# Patient Record
Sex: Male | Born: 1935 | Race: White | Hispanic: No | Marital: Married | Smoking: Never smoker
Health system: Southern US, Community
[De-identification: ages and names within clinical notes are randomized; demographics above are authoritative.]

## PROBLEM LIST (undated history)

## (undated) DIAGNOSIS — H5440 Blindness, one eye, unspecified eye: Secondary | ICD-10-CM

## (undated) DIAGNOSIS — R32 Unspecified urinary incontinence: Secondary | ICD-10-CM

## (undated) DIAGNOSIS — I10 Essential (primary) hypertension: Secondary | ICD-10-CM

## (undated) DIAGNOSIS — N189 Chronic kidney disease, unspecified: Secondary | ICD-10-CM

## (undated) DIAGNOSIS — N393 Stress incontinence (female) (male): Secondary | ICD-10-CM

## (undated) DIAGNOSIS — E785 Hyperlipidemia, unspecified: Secondary | ICD-10-CM

## (undated) DIAGNOSIS — M1990 Unspecified osteoarthritis, unspecified site: Secondary | ICD-10-CM

## (undated) DIAGNOSIS — H3320 Serous retinal detachment, unspecified eye: Secondary | ICD-10-CM

## (undated) DIAGNOSIS — C189 Malignant neoplasm of colon, unspecified: Secondary | ICD-10-CM

## (undated) DIAGNOSIS — I340 Nonrheumatic mitral (valve) insufficiency: Secondary | ICD-10-CM

## (undated) DIAGNOSIS — K5790 Diverticulosis of intestine, part unspecified, without perforation or abscess without bleeding: Secondary | ICD-10-CM

## (undated) DIAGNOSIS — C61 Malignant neoplasm of prostate: Secondary | ICD-10-CM

## (undated) DIAGNOSIS — C4359 Malignant melanoma of other part of trunk: Secondary | ICD-10-CM

## (undated) DIAGNOSIS — H409 Unspecified glaucoma: Secondary | ICD-10-CM

## (undated) DIAGNOSIS — H5316 Psychophysical visual disturbances: Secondary | ICD-10-CM

## (undated) DIAGNOSIS — D171 Benign lipomatous neoplasm of skin and subcutaneous tissue of trunk: Secondary | ICD-10-CM

## (undated) DIAGNOSIS — I2510 Atherosclerotic heart disease of native coronary artery without angina pectoris: Secondary | ICD-10-CM

## (undated) HISTORY — DX: Nonrheumatic mitral (valve) insufficiency: I34.0

## (undated) HISTORY — PX: EYE SURGERY: SHX253

## (undated) HISTORY — DX: Unspecified glaucoma: H40.9

## (undated) HISTORY — DX: Diverticulosis of intestine, part unspecified, without perforation or abscess without bleeding: K57.90

## (undated) HISTORY — DX: Hyperlipidemia, unspecified: E78.5

## (undated) HISTORY — DX: Malignant melanoma of other part of trunk: C43.59

## (undated) HISTORY — DX: Unspecified osteoarthritis, unspecified site: M19.90

## (undated) HISTORY — PX: TONSILLECTOMY: SUR1361

## (undated) HISTORY — PX: CHOLECYSTECTOMY: SHX55

## (undated) HISTORY — DX: Unspecified urinary incontinence: R32

## (undated) HISTORY — PX: APPENDECTOMY: SHX54

## (undated) HISTORY — DX: Malignant neoplasm of colon, unspecified: C18.9

## (undated) HISTORY — DX: Malignant neoplasm of prostate: C61

## (undated) HISTORY — DX: Benign lipomatous neoplasm of skin and subcutaneous tissue of trunk: D17.1

## (undated) HISTORY — PX: CARDIAC CATHETERIZATION: SHX172

## (undated) HISTORY — DX: Blindness, one eye, unspecified eye: H54.40

## (undated) HISTORY — DX: Serous retinal detachment, unspecified eye: H33.20

---

## 1980-01-19 HISTORY — PX: CORONARY ARTERY BYPASS GRAFT: SHX141

## 2000-05-27 ENCOUNTER — Ambulatory Visit (HOSPITAL_COMMUNITY): Admission: RE | Admit: 2000-05-27 | Discharge: 2000-05-27 | Payer: Self-pay | Admitting: Gastroenterology

## 2000-05-27 ENCOUNTER — Encounter (INDEPENDENT_AMBULATORY_CARE_PROVIDER_SITE_OTHER): Payer: Self-pay | Admitting: *Deleted

## 2000-06-01 ENCOUNTER — Encounter: Admission: RE | Admit: 2000-06-01 | Discharge: 2000-06-01 | Payer: Self-pay | Admitting: Gastroenterology

## 2000-06-01 ENCOUNTER — Encounter: Payer: Self-pay | Admitting: Gastroenterology

## 2000-07-04 ENCOUNTER — Encounter: Payer: Self-pay | Admitting: Surgery

## 2000-07-06 ENCOUNTER — Encounter (INDEPENDENT_AMBULATORY_CARE_PROVIDER_SITE_OTHER): Payer: Self-pay | Admitting: Specialist

## 2000-07-06 ENCOUNTER — Inpatient Hospital Stay (HOSPITAL_COMMUNITY): Admission: RE | Admit: 2000-07-06 | Discharge: 2000-07-11 | Payer: Self-pay | Admitting: Surgery

## 2002-03-06 ENCOUNTER — Ambulatory Visit (HOSPITAL_COMMUNITY): Admission: RE | Admit: 2002-03-06 | Discharge: 2002-03-06 | Payer: Self-pay | Admitting: Gastroenterology

## 2005-10-31 ENCOUNTER — Inpatient Hospital Stay (HOSPITAL_COMMUNITY): Admission: EM | Admit: 2005-10-31 | Discharge: 2005-11-02 | Payer: Self-pay | Admitting: Emergency Medicine

## 2007-01-06 ENCOUNTER — Encounter: Admission: RE | Admit: 2007-01-06 | Discharge: 2007-01-06 | Payer: Self-pay | Admitting: Internal Medicine

## 2010-06-05 NOTE — Cardiovascular Report (Signed)
NAME:  Luis Pierce, NEEDS NO.:  [id number]   MEDICAL RECORD NO.:  [id number]          PATIENT TYPE:  INP   LOCATION:  6522                         FACILITY:  [facility]   PHYSICIAN:  Lyn Records, M.D.   DATE OF BIRTH:  11-04-1922   DATE OF PROCEDURE:  11/01/2005  DATE OF DISCHARGE:                              CARDIAC CATHETERIZATION   REASON FOR PROCEDURE:  Unstable angina/new-onset, history of coronary artery  disease with prior saphenous vein graft to the LAD in 1982, and RCA stent  with bare metal device in 1999.   PROCEDURES PERFORMED:  1. Left heart catheterization.  2. Selective coronary angiography.  3. Left ventriculography.  4. Saphenous vein graft angiography.  5. Stent right coronary artery, bare metal.  6. Angio-Seal.   DESCRIPTION OF PROCEDURE:  After informed consent, a 6-French sheath was  placed in the right femoral artery using modified Seldinger technique.  A 6-  [place], A2 multipurpose catheter was used for hemodynamic recordings, left  ventriculography by hand injection, selective saphenous vein graft  angiography.  We then used a #4 left and right, 6-French, preformed FL and  FR catheters for left and right coronary angiography respectively.  The  patient tolerated the procedure without complications.   The right coronary had a high-grade lesion just distal to the margin of the  stent in the proximal right coronary that appeared to represent a ruptured  plaque.  We proceeded with PCI using Angiomax and 600 mg of oral Plavix.  The single bolus led to an ECT of 300.  We predilated using an Publishing rights manager guidewire, a 6-French Shepherd's crook right guide catheter and a  2.5 x 12-mm long Maverick.  We then stented using a 15 mm long x 3.5 mm  diameter vision Multilink stent.  Two balloon inflations were performed up  to 10 atmospheres.  We then postdilated to 15 atmospheres within the stent  using a 3.5 x 12 mm Quantum.  Post dilatation of the  high-grade stenosis was  reduced to 0%, but there was a very small, non-flow limiting and edge  dissection noted.  We then deployed a 12 x 3.5-mm Vision Multilink to peak  pressure of 9 atmospheres and then postdilated with the Quantum 3512 balloon  to 12 atmospheres with a nice angiographic result.  TIMI-3 flow resulted.  Angiomax was discontinued.   We demonstrated adequate anatomy for Angio-Seal closure.  We had  complication with the device in that the handle came off of the plug device  and we were just barely able to get the delivery sheath out of the patient's  groin.  We were then able to cut the device and cut the suture that holds  the plug and were then able to work the plug through the subcutaneous tissue  to the arterial site.  Good hemostasis was achieved.   CONCLUSIONS:  1. New-onset/unstable angina due to plaque rupture in the mid RCA just      beyond the distal margin of previously placed bare metal stent in 1999.  2. Patent circumflex native vessel with luminal  irregularities up to 50%.  3. Patent saphenous vein graft to the LAD with degeneration and multiple      areas of up to 50% narrowing.  4. Normal LV function.  5. Successful bare metal stent implantation x2 overlapping in the mid RCA      with reduction of 99% stenosis to 0%      with TIMI grade 3 flow.  6. Successful Angio-Seal.   PLAN:  Aspirin and Plavix.  Plavix should continue x3-4 weeks.  Clinical  followup thereafter.      Lyn Records, M.D.  Electronically Signed     HWS/MEDQ  D:  11/01/2005  T:  11/02/2005  Job:  [id number]   cc:   Hal T. Stoneking, M.D.

## 2010-06-05 NOTE — Discharge Summary (Signed)
[facility]  Patient:    Luis Pierce, Luis Pierce                       MRN: [id number] Adm. Date:  [id number] Disc. Date: [id number] Attending:  Charlton Haws CC:         Genene Churn. Sherin Quarry, M.D.   Discharge Summary  Office MR #[id number]  DISCHARGE DIAGNOSES: 1. Carcinoma of the ascending colon (T1, N0, M0). 2. Chronic calculous cholecystitis.  HISTORY OF PRESENT ILLNESS:  Luis Pierce is a 77 year old male recently found to have multiple polyps in the ascending colon and gallstones.  He was admitted for right colectomy and cholecystectomy.  HOSPITAL COURSE:  The patient was admitted and taken to the operating room. He tolerated a right colectomy and cholecystectomy nicely.  He had a very benign postoperative course.  He was up and about on the first day after surgery and started p.o. liquids on the postop day #3.  He was also started back on his antihypertensives.  Pathology report did show a stage I carcinoma in one of the polyps.  By June 24, he was tolerating solids, wanted to go home, had a bowel movement and was up and around having minimal pain and passing gas.  His vital signs were stable.  He was alert and his abdomen was soft and and benign.  CONDITION ON DISCHARGE:  Satisfactory condition.  FOLLOWUP:  Follow up with Dr. Jamey Ripa within two weeks.  DISCHARGE MEDICATIONS:  Usual home medications.  DIET:  Regular diet. DD:  08/15/00 TD:  08/15/00 Job: [id number]

## 2010-06-05 NOTE — Discharge Summary (Signed)
NAMEMarland Kitchen  Luis Pierce, Luis Pierce NO.:  [id number]   MEDICAL RECORD NO.:  [id number]          PATIENT TYPE:  INP   LOCATION:  6522                         FACILITY:  [facility]   PHYSICIAN:  Lyn Records, M.D.   DATE OF BIRTH:  05/21/1922   DATE OF ADMISSION:  10/31/2005  DATE OF DISCHARGE:  11/02/2005                               DISCHARGE SUMMARY   DISCHARGE DIAGNOSES:  1. Chest pain, resolved.  2. Coronary artery disease, non-ST-segment-elevated myocardial      infarction with a maximum troponin of 0.59, status post bare-metal      stent to the right coronary artery on November 01, 2005, under the      care of Dr. Verdis Prime.  3. Hypertension.  4. Hypercholesterolemia.  5. Glaucoma, left eye blindness  6. Long-term medication use.   HISTORY:  Luis Pierce is a 77 year old male patient who has a history of  coronary artery bypass grafting in 1982, with the following graft:  Saphenous vein graft to the LAD.  He had new onset of angina on the day  of admission, relieved with sublingual nitroglycerin.  His troponin  maximized at 0.59.  EKG showed normal sinus rhythm with no acute ST-T  wave changes, early R-wave progression in the anterior leads.   The patient then underwent cardiac catheterization on November 01, 2005,  and was found to have the following:  Less than normal LAD, 100% at the  ostium, circumflex 30% to 50% proximal and mid vessel, 70% distal RCA  was subtitled just beyond previous stent margin.  At this area, 2 bare-  metal stents were placed, reducing the subtotal lesion to 0%.  The rest  of the procedure was TIMI-3 flow.  Saphenous vein graft to the LAD was  degenerated with a less than 50% stenosis.  The patient remained in the  hospital overnight.  The following morning, his right groin was stable,  and he was thought to be ready for discharge to home.   DISCHARGE MEDICATIONS:  Include Plavix 75 mg a day, aspirin 325 mg a  day, Lipitor 80 mg a day,  Singulair 10 mg a day, Allegra 180 mg a day,  Advair daily, Toprol XL 50 mg a day, Zetia 10 mg a day, dilantin,  timolol eye drops as before.   FOLLOWUP:  Follow up with Dr. Katrinka Blazing on November 16, 2005, at 1:30 p.m.      Guy Franco, P.A.      Lyn Records, M.D.  Electronically Signed    LB/MEDQ  D:  11/22/2005  T:  11/23/2005  Job:  [id number]   cc:   Lyn Records, M.D.

## 2010-06-05 NOTE — Op Note (Signed)
[facility]  Patient:    Luis Pierce, Luis Pierce                       MRN: [id number] Proc. Date: 07/06/00 Adm. Date:  [id number] Attending:  Charlton Haws CC:         Modesta Messing, M.D.  Genene Churn. Sherin Quarry, M.D.   Operative Report  CCS#:  [id number]  PREOPERATIVE DIAGNOSES: 1. Multiple polyps of ascending colon. 2. Gallstones.  POSTOPERATIVE DIAGNOSES: 1. Multiple polyps of ascending colon. 2. Gallstones.  OPERATION PERFORMED:  Right colectomy with primary anastomosis and cholecystectomy.  SURGEON:  Dr. Jamey Ripa.  ASSISTANT:  Dr. Ezzard Standing.  ANESTHESIA:  General endotracheal.  CLINICAL HISTORY:  This patient is a 77 year old gentleman who had quite positive stools and on colonoscopy was found to have multiple polyps of his cecum and ascending colon with what appeared to be a fairly large one at the hepatic flexure and at the cecum both of which could not be removed colonoscopically. A couple of in between ones were left behind and the remaining colon, however, had been cleared. CT scan showed several small stones at the gallbladder neck. After discussion with the patient, he was admitted for right colectomy with primary anastomosis and cholecystectomy.  DESCRIPTION OF PROCEDURE:  The patient was brought to the operating room having had a Foley catheter placed in the holding area by Dr. Aldean Ast and a cuff around the ureter that had been placed for ______ deflated. After satisfactory general endotracheal anesthesia had been obtained, the abdomen was prepped and draped. I made a transverse right upper abdominal incision and divided the subcutaneous tissues anterior sheath muscle and exposed the posterior sheath. This was all done with cautery. The posterior sheath was picked up with a knife and opened with the cautery. Bimanual exam showed no gross abnormalities. There were adhesions of the small bowel and the pelvis to the old appendectomy  scar which were taken down sharply. The wound retractor was placed. The colon was mobilized and I could feel what I thought was one of the polyps in the cecum but couldnt feel the one at the hepatic flexure.  The patient was placed in the low reverse Trendelenburg and the tonsil retractor used to expose the liver and gallbladder. The gallbladder was grasped with some clamps and the peritoneum over the cystic duct opened. I found the cystic artery, triple clipped it, and divided it leaving two clips behind. The cystic duct was tiny but was able to be resected out and clipped similar to the artery and divided. The gallbladder was a little intrahepatic and I couldnt get good exposure to start from below so I took the gallbladder down from above to below using cautery. Just prior to disconnecting, I made sure everything was dry and I put a pack in the gallbladder fossa for a while and at the end of the case removed it and put some Surgicel behind but this looked completely dry.  Attention was returned to the colon. I had the entire colon mobilized up and divided the omentum so that I could separate it out and this was done in the right mid transverse colon. I divided the colon here with the GIA. I divided the small bowel wide at the junction near the ileocecal valve again with the GIA. The intervening mesentery was then divided either with the ligasure of for the large vessels clamped and double tied. Six ligasures were also used as needed.  Once the specimen was removed, I opened it on the back table. I saw a fairly large 2 cm polyp at the cecum and a second large polyp near the proximal division consistent with the two noted by Dr. Sherin Quarry on endoscopy.  The small bowel was then tacked to the colon a few inches from the staple lines and the antimesenteric border of the staple lines were cut off. The GIA was inserted and fired producing a functional end to end anastomosis. The staple  line was checked for hemostasis and appeared to be dry. At the time the defect was closed with a TA-60 stapler producing a nice closure leaving the lumen quite patent and there was no tension. Everything appeared to be healthy with good bleeding at the edges. Once everything was dry, I closed the mesenteric defect with some 2-0 silks.  Gloves and instruments were changed. The abdomen was irrigated. A final check was made for hemostasis both around the gallbladder and around the colon and everything appeared to be dry.  The abdomen was closed with a running PDS on the posterior sheath, running #1 PDS on the midline and anterior sheath. The wound subcu was irrigated and the skin closed with staples.  The patient tolerated the procedure well. Estimated blood loss was 100-200 cc. There were no operative complications. All counts were correct. DD:  07/06/00 TD:  07/06/00 Job: [id number]

## 2010-06-05 NOTE — H&P (Signed)
NAME:  Luis Pierce, Luis Pierce NO.:  [id number]   MEDICAL RECORD NO.:  [id number]          PATIENT TYPE:  EMS   LOCATION:  MAJO                         FACILITY:  [facility]   PHYSICIAN:  Lyn Records, M.D.   DATE OF BIRTH:  09-19-1922   DATE OF ADMISSION:  10/31/2005  DATE OF DISCHARGE:                                HISTORY & PHYSICAL   REASON FOR ADMISSION:  Probable unstable angina.   SUBJECTIVE:  Luis Pierce is an 77 year old gentleman with a history of  coronary artery disease.  He had saphenous vein graft to the LAD in 1982.  No prior history of myocardial infarction.  Had PCI to the right coronary  with stenting in 1999.  He has been asymptomatic since that time until this  afternoon shortly after brunch he began having his chest pressure.  Episodes  would last 15 to 30 minutes and resolved. Nitroglycerin would relieve the  discomfort.  He is currently pain free.  He came to the ER by EMS.  His  daughter, Dr. Severiano Gilbert, witnessed 1 of the episodes that was associated  with an ashen color.  There was no wheezing or other complaint.   MEDICATIONS:  Singulair 10 mg per day, Zetia 10 mg per day, aspirin 81 mg  per day, Lipitor 80 mg per day.  Toprol XL 50 mg per day, Ventolin inhaler  p.r.n., fexofenadine 180 mg per day.  Advair 250/50 once a day.  Eye drops  including timolol and Xalatan.   ALLERGIES:  NONE.   PAST MEDICAL HISTORY:  1. Coronary atherosclerosis, prior CABG and PCI as noted above.  2. Hypertension.  3. Hyperlipidemia.  4. Prostate cancer 1994.  5. Colon cancer with partial colectomy 2002, gallbladder was taken out at      that time.  6. Glaucoma.  7. Melanoma in 1983.  8. Appendectomy.  9. Urethral valve.   ALLERGIES:  NONE.   HABITS:  Does not smoke or drink.   FAMILY HISTORY:  Noncontributory.   REVIEW OF SYSTEMS:  No blood in urine or stool.  Appetite has been good.  Physical activity has been vigorous.   OBJECTIVE:  VITAL SIGNS:   On exam blood pressure 140/70, heart rate 80.  Respirations 16 and nonlabored.  SKIN:  Negative for pallor.  HEENT:  Unremarkable.  Extraocular movements are full. There is decreased  pupillary reflex on the left.  NECK:  No JVD or carotid bruits.  CHEST: Clear.  CARDIAC:  S4 gallop.  No murmur, no rub.  ABDOMEN:  Soft.  No masses.  Bowel sounds normal.  Extremities:  No edema.  Femoral pulses 2+.  Posterior tibial pulses 2+.  NEUROLOGIC:  No motor or sensory deficits.   Chest x-ray pending.  Laboratory data pending.   ASSESSMENT:  1. Probable unstable angina.  2. History of coronary atherosclerosis with saphenous vein graft to the      left anterior descending in 1982 and stent to the native right coronary      artery in 1999.  3. Hypertension under control.  4. Glaucoma on multiple  eye drops.   PLAN:  1. Lovenox.  2. IV nitro.  3. Aspirin.  4. Serial enzymes.  5. Will likely need coronary angiography.      Lyn Records, M.D.  Electronically Signed     HWS/MEDQ  D:  10/31/2005  T:  11/01/2005  Job:  [id number]   cc:   Tasia Catchings, M.D.

## 2011-08-19 ENCOUNTER — Other Ambulatory Visit: Payer: Self-pay | Admitting: Urology

## 2011-09-07 ENCOUNTER — Encounter (HOSPITAL_COMMUNITY): Payer: Self-pay | Admitting: Pharmacy Technician

## 2011-09-13 ENCOUNTER — Ambulatory Visit (HOSPITAL_COMMUNITY)
Admission: RE | Admit: 2011-09-13 | Discharge: 2011-09-13 | Disposition: A | Discharge status: Home / Self Care | Payer: Medicare Other | Source: Ambulatory Visit | Attending: Urology | Admitting: Urology

## 2011-09-13 ENCOUNTER — Encounter (HOSPITAL_COMMUNITY)
Admission: RE | Admit: 2011-09-13 | Discharge: 2011-09-13 | Disposition: A | Discharge status: Home / Self Care | Payer: Medicare Other | Source: Ambulatory Visit | Attending: Urology | Admitting: Urology

## 2011-09-13 ENCOUNTER — Encounter (HOSPITAL_COMMUNITY): Payer: Self-pay

## 2011-09-13 DIAGNOSIS — Z01812 Encounter for preprocedural laboratory examination: Secondary | ICD-10-CM | POA: Insufficient documentation

## 2011-09-13 DIAGNOSIS — N393 Stress incontinence (female) (male): Secondary | ICD-10-CM | POA: Insufficient documentation

## 2011-09-13 DIAGNOSIS — J984 Other disorders of lung: Secondary | ICD-10-CM | POA: Insufficient documentation

## 2011-09-13 HISTORY — DX: Essential (primary) hypertension: I10

## 2011-09-13 HISTORY — DX: Atherosclerotic heart disease of native coronary artery without angina pectoris: I25.10

## 2011-09-13 HISTORY — DX: Chronic kidney disease, unspecified: N18.9

## 2011-09-13 LAB — BASIC METABOLIC PANEL
BUN: 22 mg/dL (ref 6–23)
Calcium: 9.5 mg/dL (ref 8.4–10.5)
Creatinine, Ser: 0.88 mg/dL (ref 0.50–1.35)
GFR calc non Af Amer: 76 mL/min — ABNORMAL LOW (ref >90)
Glucose, Bld: 94 mg/dL (ref 70–99)
Potassium: 3.5 mEq/L (ref 3.5–5.1)

## 2011-09-13 LAB — SURGICAL PCR SCREEN: MRSA, PCR: NEGATIVE

## 2011-09-13 LAB — CBC
MCH: 31.8 pg (ref 26.0–34.0)
MCHC: 35.4 g/dL (ref 30.0–36.0)
MCV: 89.8 fL (ref 78.0–100.0)
Platelets: 159 10*3/uL (ref 150–400)
RBC: 4.59 MIL/uL (ref 4.22–5.81)

## 2011-09-13 NOTE — Patient Instructions (Signed)
[address]  09/13/2011   Your procedure is scheduled on:  Tuesday 09/21/2011 at 1030am  Report to [facility] at 0800 AM.  Call this number if you have problems the morning of surgery: [phone]   Remember:   Do not eat food:After Midnight.  May have clear liquids:until Midnight .  Marland Kitchen  Take these medicines the morning of surgery with A SIP OF WATER: Metoprolol, use eye drops -Xalatan,Trusopt, Alphagan   Do not wear jewelry  Do not wear lotions, powders, or perfumes.   Do not shave 48 hours prior to surgery. Men may shave face and neck.  Do not bring valuables to the hospital.  Contacts, dentures or bridgework may not be worn into surgery.  Leave suitcase in the car. After surgery it may be brought to your room.  For patients admitted to the hospital, checkout time is 11:00 AM the day of discharge.      Special Instructions: CHG Shower Use Special Wash: 1/2 bottle night before surgery and 1/2 bottle morning of surgery.   Please read over the following fact sheets that you were given: MRSA Information, Sleeep apnea sheet, Incentive spirometry sheet, Blood transfusion sheet                Any questions, please call me at [phone] [facility].Georgeanna Lea, RN,BSN

## 2011-09-17 NOTE — H&P (Signed)
History of Present Illness   Luis Pierce has an artificial sphincter. He has leakage without awareness. The symptoms are persisting. He has frequency and some urgency.  He has a left-sided hump and reservoir and this has been described in the past. His cuff appears to be quite proximal. Clinically the sphincter has lost volume. He has a long Pfannenstiel incision.  He was here to discuss urodynamics.  Review of systems: No change in bowel or neurologic status.   He did not void and was catheterized for 20 mL. His maximum capacity is 330 mL. He did have some instability that was low pressure. He did have a mild loss of bladder compliance reaching an end filling pressure of 11 cm of water. One could argue it was a little bit higher than this. He had sensory urgency. Penile clamp was utilized during this stage. At 200 mL his Valsalva leak-point pressure was 20 cm of water with moderate to severe leakage. At one point in time he was leaking just with bladder filling with no increased abdominal pressure being generated. During voluntary voiding he voided 277 mL with a maximum flow of 20 mL per second. His maximum voiding pressure is 35 cm of water but this was on top of a mild loss of compliance. Valsalva technique with the penile clamp was utilized and measured bladder contractility. EMG activity was within normal limits. He emptied efficiently once the clamp was removed. The details of the urodynamics are signed and dictated on the urodynamic sheet.    Past Medical History Problems  1. History of  Colon Cancer V10.05 2. History of  Detachment Left Retina 3. History of  Glaucoma 365.9 4. History of  Heart Disease 429.9 5. History of  Hypercholesterolemia 272.0 6. History of  Hypertension 401.9 7. History of  Legally Blind ([place] Definition) 369.4 8. History of  Melanoma In Situ Of The Skin 172.9 9. History of  Prostate Cancer V10.46  Surgical History Problems  1. History of  Appendectomy 2.  History of  CABG (CABG) 3. History of  Cath Stent Placement 4. History of  Cath Stent Placement 5. History of  Eye Surgery Left 6. History of  Implantation Of Artificial Urinary Sphincter (AUS) 7. History of  Prostatectomy, Perineal Radical  Current Meds 1. 35M No Sting Skin Protectant MISC; Therapy: (Recorded:28Dec2007) to 2. Allegra TABS; Therapy: (Recorded:28Dec2007) to 3. Alphagan P 0.1 % Ophthalmic Solution; Therapy: 03Apr2013 to 4. Aspirin 81 MG Oral Tablet; Therapy: (Recorded:28Dec2007) to 5. Dorzolamide HCl-Timolol Mal 22.3-6.8 MG/ML Ophthalmic Solution; Therapy: 035May2013 to 6. Fish Oil CAPS; Therapy: (Recorded:28Dec2007) to 7. Latanoprost 0.005 % Ophthalmic Solution; Therapy: (Recorded:26Jun2013) to 8. Lovaza CAPS; Therapy: (Recorded:29Sep2009) to 9. Metoprolol Succinate ER 50 MG Oral Tablet Extended Release 24 Hour; Therapy: 03Dec2012 to 10. Multiple Vitamin TABS; Therapy: (Recorded:26Jun2013) to 11. Zolpidem Tartrate 5 MG Oral Tablet; Therapy: 07Jun2013 to  Allergies Medication  1. No Known Drug Allergies  Family History Problems  1. Family history of  Death In The Family Father passed at age 18 from MI 2. Family history of  Death In The Family Mother passed at age 19 from melanoma of liver 3. Family history of  Family Health Status Number Of Children 3 daughters 4. Sororal history of  Glaucoma V19.11 5. Maternal history of  Glaucoma V19.11 6. Paternal history of  Heart Disease V17.49 7. Daughter's history of  Nephrolithiasis 8. Daughter's history of  Urologic Disorder V18.7 kidney stones  Social History Problems  1. Alcohol Use 1 glass  per week 2. Caffeine Use 2 per day 3. Marital History - Currently Married 4. Never A Smoker 5. 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 Tobacco Use V15.82  Assessment Assessed  1. Urinary Incontinence Without Sensory Awareness 788.34 2. Male Stress Incontinence 788.32  Plan Male Stress Incontinence (788.32)    1. Follow-up Office  Follow-up  Requested for: 31Jul2013  Discussion/Summary   I drew Dr. Katrinka Blazing and her father, Mr. Hanken, a picture today. We talked about watchful waiting. We talked about replacement of the artificial sphincter. I will try to get his medical records from the company. I am likely going to place the cuff in the same position of equal or possibly a little bit smaller. Proximal placement was placed which would require deactivation for 6 weeks.   I drew him a picture and we talked about an artificial sphincter in detail. Pros, cons, general surgical and anesthetic risks, and other options including behavioral therapy, the male sling, and watchful waiting were discussed. He understands that sphincters are successful in 80-90% of cases for stress incontinence (dry in approximately half), 50% for urge incontinence, and that in a small percentage of cases the incontinence can worsen. Surgical risks were described but not limited to the discussion of injury to neighboring structures including the bowel (with possible life-threatening sepsis and colostomy), and bladder, urethra (all resulting in further surgery). The risk of urethral erosion and infection were discussed with sequelae. The risks of malfunction, atrophy, and hernia/skin erosion and management were discussed. Bleeding risks with transfusion rates and risks of perineal numbness and erectile dysfunction were discussed. The risks of urinary retention requiring catheterization and slowing of urinary stream were discussed. We talked about injury to nerves/soft tissue leading to debilitating and intractable pelvic, abdominal, and lower extremity pain syndromes and neuropathies. The usual post-operative course and time of activation was described. The patient understands that he might not reach his treatment goal and that he might be worse following surgery.  I also stressed the risk of injury in the urethra at the time of implantation  that would require a Foley and stoppage of the procedure.   Unless anesthesia is asked for, we are not going to cardiac clearance. Ciprofloxacin for 10 days and the usual protocol with urine culture preop was given. Sonny Masters is hoping to do this on a Thursday.  After a thorough review of the management options for the patient's condition the patient  elected to proceed with surgical therapy as noted above. We have discussed the potential benefits and risks of the procedure, side effects of the proposed treatment, the likelihood of the patient achieving the goals of the procedure, and any potential problems that might occur during the procedure or recuperation. Informed consent has been obtained.

## 2011-09-21 ENCOUNTER — Encounter (HOSPITAL_COMMUNITY)
Admission: RE | Disposition: A | Discharge status: Home / Self Care | Payer: Self-pay | Source: Ambulatory Visit | Attending: Urology

## 2011-09-21 ENCOUNTER — Ambulatory Visit (HOSPITAL_COMMUNITY): Payer: Medicare Other | Admitting: Anesthesiology

## 2011-09-21 ENCOUNTER — Encounter (HOSPITAL_COMMUNITY): Payer: Self-pay | Admitting: Anesthesiology

## 2011-09-21 ENCOUNTER — Ambulatory Visit (HOSPITAL_COMMUNITY)
Admission: RE | Admit: 2011-09-21 | Discharge: 2011-09-22 | Disposition: A | Discharge status: Home / Self Care | Payer: Medicare Other | Source: Ambulatory Visit | Attending: Urology | Admitting: Urology

## 2011-09-21 ENCOUNTER — Encounter (HOSPITAL_COMMUNITY): Payer: Self-pay | Admitting: *Deleted

## 2011-09-21 DIAGNOSIS — E7800 Pure hypercholesterolemia, unspecified: Secondary | ICD-10-CM | POA: Insufficient documentation

## 2011-09-21 DIAGNOSIS — Z85820 Personal history of malignant melanoma of skin: Secondary | ICD-10-CM | POA: Insufficient documentation

## 2011-09-21 DIAGNOSIS — H409 Unspecified glaucoma: Secondary | ICD-10-CM | POA: Insufficient documentation

## 2011-09-21 DIAGNOSIS — N393 Stress incontinence (female) (male): Secondary | ICD-10-CM | POA: Insufficient documentation

## 2011-09-21 DIAGNOSIS — Z8546 Personal history of malignant neoplasm of prostate: Secondary | ICD-10-CM | POA: Insufficient documentation

## 2011-09-21 DIAGNOSIS — T8389XA Other specified complication of genitourinary prosthetic devices, implants and grafts, initial encounter: Secondary | ICD-10-CM | POA: Insufficient documentation

## 2011-09-21 DIAGNOSIS — H548 Legal blindness, as defined in USA: Secondary | ICD-10-CM | POA: Insufficient documentation

## 2011-09-21 DIAGNOSIS — Z85038 Personal history of other malignant neoplasm of large intestine: Secondary | ICD-10-CM | POA: Insufficient documentation

## 2011-09-21 DIAGNOSIS — Z79899 Other long term (current) drug therapy: Secondary | ICD-10-CM | POA: Insufficient documentation

## 2011-09-21 DIAGNOSIS — Z951 Presence of aortocoronary bypass graft: Secondary | ICD-10-CM | POA: Insufficient documentation

## 2011-09-21 DIAGNOSIS — I10 Essential (primary) hypertension: Secondary | ICD-10-CM | POA: Insufficient documentation

## 2011-09-21 DIAGNOSIS — Y831 Surgical operation with implant of artificial internal device as the cause of abnormal reaction of the patient, or of later complication, without mention of misadventure at the time of the procedure: Secondary | ICD-10-CM | POA: Insufficient documentation

## 2011-09-21 HISTORY — PX: CYSTOSCOPY: SHX5120

## 2011-09-21 HISTORY — PX: URINARY SPHINCTER IMPLANT: SHX2624

## 2011-09-21 LAB — BASIC METABOLIC PANEL
BUN: 23 mg/dL (ref 6–23)
Calcium: 8.6 mg/dL (ref 8.4–10.5)
GFR calc Af Amer: 89 mL/min — ABNORMAL LOW (ref >90)
GFR calc non Af Amer: 76 mL/min — ABNORMAL LOW (ref >90)
Potassium: 3.7 mEq/L (ref 3.5–5.1)

## 2011-09-21 LAB — TYPE AND SCREEN: Antibody Screen: NEGATIVE

## 2011-09-21 SURGERY — INSERTION, ARTIFICIAL URINARY SPHINCTER
Anesthesia: General | Wound class: Clean

## 2011-09-21 MED ORDER — PHENYLEPHRINE HCL 10 MG/ML IJ SOLN
INTRAMUSCULAR | Status: DC | PRN
  Administered 2011-09-21: 20 ug via INTRAVENOUS
  Administered 2011-09-21: 40 ug via INTRAVENOUS
  Administered 2011-09-21: 20 ug via INTRAVENOUS

## 2011-09-21 MED ORDER — BRIMONIDINE TARTRATE 0.15 % OP SOLN
1.0000 [drp] | Freq: Two times a day (BID) | OPHTHALMIC | Status: DC
  Administered 2011-09-21 – 2011-09-22 (×2): 1 [drp] via OPHTHALMIC
  Filled 2011-09-21: qty 5

## 2011-09-21 MED ORDER — ACETAMINOPHEN 10 MG/ML IV SOLN
INTRAVENOUS | Status: AC
  Filled 2011-09-21: qty 100

## 2011-09-21 MED ORDER — ATORVASTATIN CALCIUM 80 MG PO TABS
80.0000 mg | ORAL_TABLET | Freq: Every evening | ORAL | Status: DC
  Administered 2011-09-21: 80 mg via ORAL
  Filled 2011-09-21 (×2): qty 1

## 2011-09-21 MED ORDER — LATANOPROST 0.005 % OP SOLN
1.0000 [drp] | Freq: Every day | OPHTHALMIC | Status: DC
  Administered 2011-09-21: 1 [drp] via OPHTHALMIC
  Filled 2011-09-21: qty 2.5

## 2011-09-21 MED ORDER — ACETAMINOPHEN 10 MG/ML IV SOLN
INTRAVENOUS | Status: DC | PRN
  Administered 2011-09-21: 1000 mg via INTRAVENOUS

## 2011-09-21 MED ORDER — METOPROLOL SUCCINATE ER 50 MG PO TB24
50.0000 mg | ORAL_TABLET | Freq: Every day | ORAL | Status: DC
  Administered 2011-09-22: 50 mg via ORAL
  Filled 2011-09-21: qty 1

## 2011-09-21 MED ORDER — CEFAZOLIN SODIUM-DEXTROSE 2-3 GM-% IV SOLR
2.0000 g | INTRAVENOUS | Status: AC
  Administered 2011-09-21: 2 g via INTRAVENOUS

## 2011-09-21 MED ORDER — OXYCODONE HCL 5 MG PO TABS: 5.0000 mg | ORAL_TABLET | Freq: Once | ORAL | Status: DC | PRN

## 2011-09-21 MED ORDER — OXYCODONE HCL 5 MG/5ML PO SOLN: 5.0000 mg | Freq: Once | ORAL | Status: DC | PRN

## 2011-09-21 MED ORDER — BRIMONIDINE TARTRATE 0.1 % OP SOLN: 1.0000 [drp] | Freq: Two times a day (BID) | OPHTHALMIC | Status: DC

## 2011-09-21 MED ORDER — PROMETHAZINE HCL 25 MG/ML IJ SOLN: 6.2500 mg | INTRAMUSCULAR | Status: DC | PRN

## 2011-09-21 MED ORDER — OMEGA-3-ACID ETHYL ESTERS 1 G PO CAPS
2.0000 g | ORAL_CAPSULE | Freq: Two times a day (BID) | ORAL | Status: DC
  Administered 2011-09-21 – 2011-09-22 (×2): 2 g via ORAL
  Filled 2011-09-21 (×3): qty 2

## 2011-09-21 MED ORDER — PROPOFOL 10 MG/ML IV EMUL
INTRAVENOUS | Status: DC | PRN
  Administered 2011-09-21: 120 ug/kg/min via INTRAVENOUS

## 2011-09-21 MED ORDER — ACETAMINOPHEN 10 MG/ML IV SOLN: 1000.0000 mg | Freq: Once | INTRAVENOUS | Status: DC | PRN

## 2011-09-21 MED ORDER — BUPIVACAINE HCL 0.75 % IJ SOLN
INTRAMUSCULAR | Status: DC | PRN
  Administered 2011-09-21: 15 mg via INTRATHECAL

## 2011-09-21 MED ORDER — SODIUM CHLORIDE 0.9 % IR SOLN
Status: DC | PRN
  Administered 2011-09-21: 12:00:00

## 2011-09-21 MED ORDER — STERILE WATER FOR IRRIGATION IR SOLN
Status: DC | PRN
  Administered 2011-09-21: 30 mL

## 2011-09-21 MED ORDER — ACETAMINOPHEN 10 MG/ML IV SOLN
1000.0000 mg | Freq: Four times a day (QID) | INTRAVENOUS | Status: DC
  Administered 2011-09-21 – 2011-09-22 (×2): 1000 mg via INTRAVENOUS
  Filled 2011-09-21 (×4): qty 100

## 2011-09-21 MED ORDER — FENTANYL CITRATE 0.05 MG/ML IJ SOLN
INTRAMUSCULAR | Status: DC | PRN
  Administered 2011-09-21: 50 ug via INTRAVENOUS
  Administered 2011-09-21: 25 ug via INTRAVENOUS

## 2011-09-21 MED ORDER — METOPROLOL SUCCINATE ER 50 MG PO TB24
50.0000 mg | ORAL_TABLET | ORAL | Status: AC
  Administered 2011-09-21: 50 mg via ORAL
  Filled 2011-09-21: qty 1

## 2011-09-21 MED ORDER — GENTAMICIN IN SALINE 1.6-0.9 MG/ML-% IV SOLN
80.0000 mg | INTRAVENOUS | Status: AC
  Administered 2011-09-21: 80 mg via INTRAVENOUS

## 2011-09-21 MED ORDER — HYDROCODONE-ACETAMINOPHEN 5-325 MG PO TABS
1.0000 | ORAL_TABLET | ORAL | Status: DC | PRN
  Administered 2011-09-21: 1 via ORAL
  Filled 2011-09-21: qty 1

## 2011-09-21 MED ORDER — GENTAMICIN IN SALINE 1.6-0.9 MG/ML-% IV SOLN
INTRAVENOUS | Status: AC
  Filled 2011-09-21: qty 50

## 2011-09-21 MED ORDER — HYDROMORPHONE HCL PF 1 MG/ML IJ SOLN: 0.2500 mg | INTRAMUSCULAR | Status: DC | PRN

## 2011-09-21 MED ORDER — CEFAZOLIN SODIUM-DEXTROSE 2-3 GM-% IV SOLR
INTRAVENOUS | Status: AC
  Filled 2011-09-21: qty 50

## 2011-09-21 MED ORDER — MEPERIDINE HCL 50 MG/ML IJ SOLN: 6.2500 mg | INTRAMUSCULAR | Status: DC | PRN

## 2011-09-21 MED ORDER — DORZOLAMIDE HCL 2 % OP SOLN
1.0000 [drp] | Freq: Two times a day (BID) | OPHTHALMIC | Status: DC
  Administered 2011-09-21 – 2011-09-22 (×2): 1 [drp] via OPHTHALMIC
  Filled 2011-09-21: qty 10

## 2011-09-21 MED ORDER — LIDOCAINE HCL 2 % EX GEL
CUTANEOUS | Status: AC
  Filled 2011-09-21: qty 10

## 2011-09-21 MED ORDER — INDIGOTINDISULFONATE SODIUM 8 MG/ML IJ SOLN
INTRAMUSCULAR | Status: AC
  Filled 2011-09-21: qty 10

## 2011-09-21 MED ORDER — 0.9 % SODIUM CHLORIDE (POUR BTL) OPTIME
TOPICAL | Status: DC | PRN
  Administered 2011-09-21: 1000 mL

## 2011-09-21 MED ORDER — LACTATED RINGERS IV SOLN
INTRAVENOUS | Status: DC
  Administered 2011-09-21: 1000 mL via INTRAVENOUS

## 2011-09-21 MED ORDER — MORPHINE SULFATE 2 MG/ML IJ SOLN: 2.0000 mg | INTRAMUSCULAR | Status: DC | PRN

## 2011-09-21 MED ORDER — DEXTROSE-NACL 5-0.45 % IV SOLN
INTRAVENOUS | Status: DC
  Administered 2011-09-21 (×2): via INTRAVENOUS

## 2011-09-21 MED ORDER — STERILE WATER FOR IRRIGATION IR SOLN
Status: DC | PRN
  Administered 2011-09-21: 3000 mL

## 2011-09-21 SURGICAL SUPPLY — 58 items
ADH SKN CLS APL DERMABOND .7 (GAUZE/BANDAGES/DRESSINGS) ×3
BAG URINE DRAINAGE (UROLOGICAL SUPPLIES) ×2 IMPLANT
BAG URO CATCHER STRL LF (DRAPE) ×1 IMPLANT
BALLOON PRESSURE REGUL 61 70CM (Miscellaneous) IMPLANT
BANDAGE GAUZE ELAST BULKY 4 IN (GAUZE/BANDAGES/DRESSINGS) ×2 IMPLANT
BLADE SURG 15 STRL LF DISP TIS (BLADE) ×2 IMPLANT
BLADE SURG 15 STRL SS (BLADE) ×4
BRIEF STRETCH FOR OB PAD LRG (UNDERPADS AND DIAPERS) ×2 IMPLANT
CANISTER SUCTION 2500CC (MISCELLANEOUS) ×2 IMPLANT
CATH FOLEY 2WAY SLVR  5CC 14FR (CATHETERS) ×1
CATH FOLEY 2WAY SLVR 5CC 14FR (CATHETERS) ×1 IMPLANT
CLOTH BEACON ORANGE TIMEOUT ST (SAFETY) ×2 IMPLANT
CONTROL PUMP (Urological Implant) ×1 IMPLANT
COVER MAYO STAND STRL (DRAPES) ×3 IMPLANT
COVER SURGICAL LIGHT HANDLE (MISCELLANEOUS) ×2 IMPLANT
CUFF URINARY OCCL 4. IZ (Miscellaneous) ×1 IMPLANT
DERMABOND ADVANCED (GAUZE/BANDAGES/DRESSINGS) ×3
DERMABOND ADVANCED .7 DNX12 (GAUZE/BANDAGES/DRESSINGS) ×2 IMPLANT
DISSECTOR ROUND CHERRY 3/8 STR (MISCELLANEOUS) ×2 IMPLANT
DRAPE CAMERA CLOSED 9X96 (DRAPES) ×2 IMPLANT
DRAPE LG THREE QUARTER DISP (DRAPES) ×2 IMPLANT
DRESSING TELFA 8X3 (GAUZE/BANDAGES/DRESSINGS) ×2 IMPLANT
ELECT REM PT RETURN 9FT ADLT (ELECTROSURGICAL) ×2
ELECTRODE REM PT RTRN 9FT ADLT (ELECTROSURGICAL) ×1 IMPLANT
GAUZE SPONGE 4X4 16PLY XRAY LF (GAUZE/BANDAGES/DRESSINGS) ×4 IMPLANT
GLOVE BIOGEL M STRL SZ7.5 (GLOVE) ×2 IMPLANT
GOWN PREVENTION PLUS XLARGE (GOWN DISPOSABLE) ×2 IMPLANT
GOWN STRL REIN XL XLG (GOWN DISPOSABLE) ×2 IMPLANT
KIT ACCESSORY AMS 800 ×1 IMPLANT
KIT BASIN OR (CUSTOM PROCEDURE TRAY) ×2 IMPLANT
LOOP VESSEL MAXI BLUE (MISCELLANEOUS) ×1 IMPLANT
LUBRICANT JELLY K Y 4OZ (MISCELLANEOUS) ×2 IMPLANT
NEEDLE INJECT RIGID (NEEDLE) IMPLANT
NEEDLE INJECT RIGID 21GA 14.6 (NEEDLE) IMPLANT
NS IRRIG 1000ML POUR BTL (IV SOLUTION) ×1 IMPLANT
PACK BASIC VI WITH GOWN DISP (CUSTOM PROCEDURE TRAY) ×1 IMPLANT
PACK CYSTO (CUSTOM PROCEDURE TRAY) ×2 IMPLANT
PENCIL BUTTON HOLSTER BLD 10FT (ELECTRODE) ×2 IMPLANT
PLUG CATH AND CAP STER (CATHETERS) ×2 IMPLANT
PRESS REG BALL 61 70CM (Miscellaneous) ×2 IMPLANT
SET CYSTO W/LG BORE CLAMP LF (SET/KITS/TRAYS/PACK) ×2 IMPLANT
SHEET LAVH (DRAPES) ×2 IMPLANT
SLEEVE SURGEON STRL (DRAPES) ×1 IMPLANT
SUT CHROMIC 3 0 SH 27 (SUTURE) IMPLANT
SUT MNCRL AB 4-0 PS2 18 (SUTURE) ×4 IMPLANT
SUT SILK 0 FSL (SUTURE) ×3 IMPLANT
SUT VIC AB 0 CT1 27 (SUTURE) ×2
SUT VIC AB 0 CT1 27XBRD ANTBC (SUTURE) ×1 IMPLANT
SUT VIC AB 3-0 SH 27 (SUTURE) ×12
SUT VIC AB 3-0 SH 27X BRD (SUTURE) ×4 IMPLANT
SYR 30ML LL (SYRINGE) ×3 IMPLANT
SYR BULB IRRIGATION 50ML (SYRINGE) ×2 IMPLANT
SYRINGE 10CC LL (SYRINGE) ×3 IMPLANT
SYRINGE IRR TOOMEY STRL 70CC (SYRINGE) ×1 IMPLANT
TOWEL OR 17X26 10 PK STRL BLUE (TOWEL DISPOSABLE) ×4 IMPLANT
TUBING CONNECTING 10 (TUBING) ×2 IMPLANT
WATER STERILE IRR 1500ML POUR (IV SOLUTION) ×1 IMPLANT
YANKAUER SUCT BULB TIP 10FT TU (MISCELLANEOUS) ×2 IMPLANT

## 2011-09-21 NOTE — Op Note (Signed)
Preoperative diagnosis: Malfunctioning artificial urinary sphincter and stress urinary incontinence Postoperative diagnosis: Malfunctioning artificial urinary sphincter and stress urinary incontinence Surgery: Replacement of artificial urinary sphincter and cystoscopy Surgeon: Dr. Lorin Picket Jlon Betker Asst: Pecola Leisure  The patient has the above diagnoses and consented the above procedure. Extra care was taken with leg positioning to minimize the risk of compartment syndrome neuropathy and DVT. Special preparation was given to the skin and IV antibiotics were given. In the lithotomy position the cuff was obviously quite distal at the junction of the scrotum and perineum. He had a long perineum.  He underwent flexible cystoscopy and there is no erosion and the cuff was wide open. He had a 20 [place] 6 bladder neck. The urethra looked healthy  I made a perineal incision approximately 6 cm in length but encroach upon the scrotum approximately 1 cm. I dissected down to subcutaneous tissue using my usual retractors. I exposed the cuff an open-ended pseudocapsule staying on the device. I cut the pad into being allowing me to easily remove it gently around the urethra that showed atrophy. I did a retrograde urethrogram with sterile water and there was no leak. This was done under low pressure.  I decided to remove the pump in the left hemiscrotum by opening up a small window of tissue at its tip utilizing the perineal incision. I easily delivered it cut the tubing and closed the opening with a figure-of-eight 3-0 Vicryl  With the usual landmarks I made a lower abdominal incision away from the inguinal ring and in a skin fold. I dissected down to the rectus fascia exposing the fibers nicely. He had a spinal anesthesia and he had a fair amount of abdominal breathing. I felt there was a little bit a split in the fashion exactly where 1 open-ended almost as if he had a mild hernia but he do not think he did. This  area was cephalad to the cord. The fascia was opened along the length of his fibers and I easily split a thin muscle down to the level of the transversalis fascia finger dissect in nice pocket. Reservoir was inserted and 25 cc of normal saline was inserted. I closed the fascia imbricating it quite physically with running 0 Vicryl on a CT1 needle. I very happy with the closure  With my usual technique I delivered the left dependent hemiscrotum up through the left lower quadrant incision and used my peanut to mobilize the tip of my finger at the appropriate depth. I delivered the well-prepared pump under the tip of my finger in a laid in the right hemiscrotum dependently. Babcock maneuver was utilized.  Peroneal he I gently placed a well-prepared 4 cm cuff anatomically and delivered the tubing up through the left lower quadrant incision.  With good exposure and utilizing a fair amount of length I connected all tubing is appropriately using quick connects and irrigating needels and crimper. I then cycled device twice. I left a few minutes I cystoscoped the patient. He had excellent coaptation of the urethra and a looked quite snug.  For this reason I did not move the cuff more proximally.  I urinated all incisions. I closed the perineum with 3 layers of 3-0 Vicryl anatomically followed by 4-0 subcuticular. Left lower quadrant incision was closed with one layer 3-0 Vicryl and 4-0 subcuticular. Dermabond and my fluff dressing was applied. Leg position was good. Blood loss was less than 50 mL  I was very pleased to the procedure and hopefully it  will reach the patient's treatment goa.as consented I left the reservoir in situ and part of the pump tubing in situ l

## 2011-09-21 NOTE — Anesthesia Postprocedure Evaluation (Signed)
Anesthesia Post Note  Patient: Luis Pierce  Procedure(s) Performed: Procedure(s) (LRB): ARTIFICIAL URINARY SPHINCTER (N/A) CYSTOSCOPY (N/A)  Anesthesia type: Spinal  Patient location: PACU  Post pain: Pain level controlled  Post assessment: Post-op Vital signs reviewed  Last Vitals: BP 120/58  Pulse 41  Temp 36.5 C  Resp 14  SpO2 96%  Post vital signs: Reviewed  Level of consciousness: sedated  Complications: No apparent anesthesia complications

## 2011-09-21 NOTE — Transfer of Care (Signed)
Immediate Anesthesia Transfer of Care Note  Patient: Luis Pierce  Procedure(s) Performed: Procedure(s) (LRB) with comments: ARTIFICIAL URINARY SPHINCTER (N/A) CYSTOSCOPY (N/A)  Patient Location: PACU  Anesthesia Type: Regional and Spinal  Level of Consciousness: awake, sedated and patient cooperative  Airway & Oxygen Therapy: Patient Spontanous Breathing and Patient connected to face mask oxygen  Post-op Assessment: Report given to PACU RN and Post -op Vital signs reviewed and stable  Post vital signs: Reviewed and stable  Complications: No apparent anesthesia complications

## 2011-09-21 NOTE — Anesthesia Preprocedure Evaluation (Addendum)
Anesthesia Evaluation  Patient identified by MRN, date of birth, ID band Patient awake    Reviewed: Allergy & Precautions, H&P , NPO status , Patient's Chart, lab work & pertinent test results, reviewed documented beta blocker date and time   History of Anesthesia Complications Negative for: history of anesthetic complications  Airway Mallampati: II TM Distance: >3 FB Neck ROM: Full    Dental  (+) Teeth Intact and Dental Advisory Given   Pulmonary neg pulmonary ROS,  breath sounds clear to auscultation  Pulmonary exam normal       Cardiovascular Exercise Tolerance: Good hypertension, Pt. on medications and Pt. on home beta blockers + CAD, + Cardiac Stents and + CABG Rhythm:Regular Rate:Normal     Neuro/Psych negative neurological ROS  negative psych ROS   GI/Hepatic negative GI ROS, Neg liver ROS,   Endo/Other  negative endocrine ROS  Renal/GU Renal disease     Musculoskeletal   Abdominal   Peds  Hematology negative hematology ROS (+)   Anesthesia Other Findings   Reproductive/Obstetrics                         Anesthesia Physical Anesthesia Plan  ASA: III  Anesthesia Plan: Spinal   Post-op Pain Management:    Induction:   Airway Management Planned:   Additional Equipment:   Intra-op Plan:   Post-operative Plan: Extubation in OR  Informed Consent: I have reviewed the patients History and Physical, chart, labs and discussed the procedure including the risks, benefits and alternatives for the proposed anesthesia with the patient or authorized representative who has indicated his/her understanding and acceptance.   Dental advisory given  Plan Discussed with: CRNA and Surgeon  Anesthesia Plan Comments:        Anesthesia Quick Evaluation

## 2011-09-21 NOTE — Progress Notes (Signed)
Vitals normal Laboratory tests normal Patient alert and stable Pain minimal and well-controlled Post treatment course discussed in detail Followup discussed in detail See orders 

## 2011-09-21 NOTE — Anesthesia Procedure Notes (Addendum)
Spinal  Patient location during procedure: OR Start time: 09/21/2011 10:29 AM End time: 09/21/2011 10:34 AM Staffing Anesthesiologist: Lewie Loron R Performed by: anesthesiologist  Preanesthetic Checklist Completed: patient identified, site marked, surgical consent, pre-op evaluation, timeout performed, IV checked, risks and benefits discussed and monitors and equipment checked Spinal Block Patient position: sitting Prep: Betadine Patient monitoring: heart rate, continuous pulse ox and blood pressure Approach: midline Location: L3-4 Injection technique: single-shot Needle Needle type: Sprotte  Needle gauge: 24 G Needle length: 9 cm Needle insertion depth: 7 cm Assessment Sensory level: T8 Additional Notes Expiration date of kit checked and confirmed. Patient tolerated procedure well, without complications.

## 2011-09-21 NOTE — Interval H&P Note (Signed)
History and Physical Interval Note:  09/21/2011 7:26 AM  Luis Pierce  has presented today for surgery, with the diagnosis of stress incontinence  The various methods of treatment have been discussed with the patient and family. After consideration of risks, benefits and other options for treatment, the patient has consented to  Procedure(s) (LRB): ARTIFICIAL URINARY SPHINCTER (N/A) CYSTOSCOPY (N/A) as a surgical intervention .  The patient's history has been reviewed, patient examined, no change in status, stable for surgery.  I have reviewed the patient's chart and labs.  Questions were answered to the patient's satisfaction.     Kobee Medlen A

## 2011-09-21 NOTE — Anesthesia Postprocedure Evaluation (Signed)
Anesthesia Post Note  Patient: Luis Pierce  Procedure(s) Performed: Procedure(s) (LRB): ARTIFICIAL URINARY SPHINCTER (N/A) CYSTOSCOPY (N/A)  Anesthesia type: Spinal  Patient location: PACU  Post pain: Pain level controlled  Post assessment: Post-op Vital signs reviewed  Last Vitals: BP 131/67  Pulse 45  Temp 36.3 C  Resp 12  SpO2 97%  Post vital signs: Reviewed  Level of consciousness: sedated  Complications: No apparent anesthesia complications

## 2011-09-22 ENCOUNTER — Encounter (HOSPITAL_COMMUNITY): Payer: Self-pay | Admitting: Urology

## 2011-09-22 LAB — BASIC METABOLIC PANEL
BUN: 19 mg/dL (ref 6–23)
Calcium: 8.4 mg/dL (ref 8.4–10.5)
Creatinine, Ser: 0.83 mg/dL (ref 0.50–1.35)
GFR calc non Af Amer: 77 mL/min — ABNORMAL LOW (ref >90)
Glucose, Bld: 110 mg/dL — ABNORMAL HIGH (ref 70–99)
Sodium: 140 mEq/L (ref 135–145)

## 2011-09-22 MED ORDER — HYDROCODONE-ACETAMINOPHEN 5-500 MG PO TABS: 1.0000 | ORAL_TABLET | Freq: Four times a day (QID) | ORAL | Status: AC | PRN

## 2011-09-22 NOTE — Progress Notes (Signed)
Discontinued foley, educated patient.

## 2011-09-22 NOTE — Progress Notes (Signed)
Vitals normal Laboratory tests normal Wounds look great Patient alert and stable Pain minimal and well-controlled Post treatment course discussed in detail Followup discussed in detail See orders

## 2011-09-22 NOTE — Progress Notes (Addendum)
Voided at 0915.  Voided 50 ml at 1000.  Post void bladder scan = zero ml at 1007.  Voided 50 ml at 1112.  Post void bladder scan = 0.  Md aware.

## 2011-09-22 NOTE — Discharge Summary (Signed)
Date of admission: 09/21/2011  Date of discharge: 09/22/2011  Admission diagnosis: Stress incontinence  Discharge diagnosis: stress incontinence  Secondary diagnoses: malfunction artificial sphincter  History and Physical: For full details, please see admission history and physical. Briefly, Luis Pierce is a 77 y.o. year old patient with stress inconitinence and malfx artificial urinary sphincter.   Hospital Course:Surgery and post-op care went well. Incisions and labs good. Minimal pain.   Laboratory values:  Basename 09/22/11 0437 09/21/11 1603  HGB 12.6* 13.5  HCT 36.0* 38.1*    Basename 09/22/11 0437 09/21/11 1403  CREATININE 0.83 0.84    Disposition: Home  Discharge instruction: The patient was instructed to be ambulatory but told to refrain from heavy lifting, strenuous activity, or driving. GIVEN  Discharge medications:  Medication List  As of 09/22/2011  8:08 AM   ASK your doctor about these medications         atorvastatin 80 MG tablet   Commonly known as: LIPITOR      brimonidine 0.1 % Soln   Commonly known as: ALPHAGAN P      ciprofloxacin 500 MG tablet   Commonly known as: CIPRO      dorzolamide 2 % ophthalmic solution   Commonly known as: TRUSOPT      latanoprost 0.005 % ophthalmic solution   Commonly known as: XALATAN      metoprolol succinate 50 MG 24 hr tablet   Commonly known as: TOPROL-XL      omega-3 acid ethyl esters 1 G capsule   Commonly known as: LOVAZA            Followup: Monday at 830 am

## 2014-03-04 ENCOUNTER — Encounter: Payer: Self-pay | Admitting: *Deleted

## 2016-07-03 ENCOUNTER — Emergency Department (HOSPITAL_COMMUNITY): Payer: Medicare Other

## 2016-07-03 ENCOUNTER — Encounter (HOSPITAL_COMMUNITY): Payer: Self-pay

## 2016-07-03 ENCOUNTER — Inpatient Hospital Stay (HOSPITAL_COMMUNITY)
Admission: EM | Admit: 2016-07-03 | Discharge: 2016-07-10 | DRG: 542 | Disposition: A | Discharge status: Rehabilitation Facility | Payer: Medicare Other | Attending: Internal Medicine | Admitting: Internal Medicine

## 2016-07-03 ENCOUNTER — Inpatient Hospital Stay (HOSPITAL_COMMUNITY): Payer: Medicare Other

## 2016-07-03 DIAGNOSIS — G9520 Unspecified cord compression: Secondary | ICD-10-CM | POA: Diagnosis not present

## 2016-07-03 DIAGNOSIS — C61 Malignant neoplasm of prostate: Secondary | ICD-10-CM | POA: Diagnosis present

## 2016-07-03 DIAGNOSIS — H409 Unspecified glaucoma: Secondary | ICD-10-CM | POA: Diagnosis present

## 2016-07-03 DIAGNOSIS — R310 Gross hematuria: Secondary | ICD-10-CM | POA: Diagnosis present

## 2016-07-03 DIAGNOSIS — G8220 Paraplegia, unspecified: Secondary | ICD-10-CM

## 2016-07-03 DIAGNOSIS — O2230 Deep phlebothrombosis in pregnancy, unspecified trimester: Secondary | ICD-10-CM

## 2016-07-03 DIAGNOSIS — K592 Neurogenic bowel, not elsewhere classified: Secondary | ICD-10-CM | POA: Diagnosis not present

## 2016-07-03 DIAGNOSIS — L89311 Pressure ulcer of right buttock, stage 1: Secondary | ICD-10-CM | POA: Diagnosis not present

## 2016-07-03 DIAGNOSIS — C7951 Secondary malignant neoplasm of bone: Secondary | ICD-10-CM | POA: Diagnosis not present

## 2016-07-03 DIAGNOSIS — N1330 Unspecified hydronephrosis: Secondary | ICD-10-CM | POA: Diagnosis present

## 2016-07-03 DIAGNOSIS — I2699 Other pulmonary embolism without acute cor pulmonale: Secondary | ICD-10-CM | POA: Diagnosis present

## 2016-07-03 DIAGNOSIS — H9190 Unspecified hearing loss, unspecified ear: Secondary | ICD-10-CM | POA: Diagnosis present

## 2016-07-03 DIAGNOSIS — N179 Acute kidney failure, unspecified: Secondary | ICD-10-CM | POA: Diagnosis not present

## 2016-07-03 DIAGNOSIS — R531 Weakness: Secondary | ICD-10-CM

## 2016-07-03 DIAGNOSIS — E538 Deficiency of other specified B group vitamins: Secondary | ICD-10-CM | POA: Diagnosis present

## 2016-07-03 DIAGNOSIS — N183 Chronic kidney disease, stage 3 (moderate): Secondary | ICD-10-CM | POA: Diagnosis present

## 2016-07-03 DIAGNOSIS — I10 Essential (primary) hypertension: Secondary | ICD-10-CM | POA: Diagnosis present

## 2016-07-03 DIAGNOSIS — I6340 Cerebral infarction due to embolism of unspecified cerebral artery: Secondary | ICD-10-CM | POA: Diagnosis not present

## 2016-07-03 DIAGNOSIS — E785 Hyperlipidemia, unspecified: Secondary | ICD-10-CM | POA: Diagnosis present

## 2016-07-03 DIAGNOSIS — R4781 Slurred speech: Secondary | ICD-10-CM | POA: Diagnosis not present

## 2016-07-03 DIAGNOSIS — M546 Pain in thoracic spine: Secondary | ICD-10-CM

## 2016-07-03 DIAGNOSIS — C7949 Secondary malignant neoplasm of other parts of nervous system: Secondary | ICD-10-CM | POA: Diagnosis present

## 2016-07-03 DIAGNOSIS — I129 Hypertensive chronic kidney disease with stage 1 through stage 4 chronic kidney disease, or unspecified chronic kidney disease: Secondary | ICD-10-CM | POA: Diagnosis present

## 2016-07-03 DIAGNOSIS — Z87442 Personal history of urinary calculi: Secondary | ICD-10-CM

## 2016-07-03 DIAGNOSIS — R680 Hypothermia, not associated with low environmental temperature: Secondary | ICD-10-CM | POA: Diagnosis present

## 2016-07-03 DIAGNOSIS — H5316 Psychophysical visual disturbances: Secondary | ICD-10-CM | POA: Diagnosis present

## 2016-07-03 DIAGNOSIS — Z79899 Other long term (current) drug therapy: Secondary | ICD-10-CM

## 2016-07-03 DIAGNOSIS — D62 Acute posthemorrhagic anemia: Secondary | ICD-10-CM | POA: Diagnosis not present

## 2016-07-03 DIAGNOSIS — Z7982 Long term (current) use of aspirin: Secondary | ICD-10-CM

## 2016-07-03 DIAGNOSIS — N393 Stress incontinence (female) (male): Secondary | ICD-10-CM | POA: Diagnosis not present

## 2016-07-03 DIAGNOSIS — Z85820 Personal history of malignant melanoma of skin: Secondary | ICD-10-CM

## 2016-07-03 DIAGNOSIS — I2510 Atherosclerotic heart disease of native coronary artery without angina pectoris: Secondary | ICD-10-CM | POA: Diagnosis present

## 2016-07-03 DIAGNOSIS — I639 Cerebral infarction, unspecified: Secondary | ICD-10-CM | POA: Insufficient documentation

## 2016-07-03 DIAGNOSIS — D6869 Other thrombophilia: Secondary | ICD-10-CM | POA: Diagnosis present

## 2016-07-03 DIAGNOSIS — R29709 NIHSS score 9: Secondary | ICD-10-CM | POA: Diagnosis present

## 2016-07-03 DIAGNOSIS — T68XXXA Hypothermia, initial encounter: Secondary | ICD-10-CM | POA: Diagnosis present

## 2016-07-03 DIAGNOSIS — N319 Neuromuscular dysfunction of bladder, unspecified: Secondary | ICD-10-CM | POA: Diagnosis not present

## 2016-07-03 DIAGNOSIS — E860 Dehydration: Secondary | ICD-10-CM | POA: Diagnosis present

## 2016-07-03 DIAGNOSIS — Z961 Presence of intraocular lens: Secondary | ICD-10-CM | POA: Diagnosis present

## 2016-07-03 DIAGNOSIS — L89301 Pressure ulcer of unspecified buttock, stage 1: Secondary | ICD-10-CM | POA: Diagnosis not present

## 2016-07-03 DIAGNOSIS — G9529 Other cord compression: Secondary | ICD-10-CM | POA: Diagnosis present

## 2016-07-03 DIAGNOSIS — R52 Pain, unspecified: Secondary | ICD-10-CM

## 2016-07-03 DIAGNOSIS — H548 Legal blindness, as defined in USA: Secondary | ICD-10-CM | POA: Diagnosis present

## 2016-07-03 DIAGNOSIS — Z85038 Personal history of other malignant neoplasm of large intestine: Secondary | ICD-10-CM

## 2016-07-03 DIAGNOSIS — I824Z1 Acute embolism and thrombosis of unspecified deep veins of right distal lower extremity: Secondary | ICD-10-CM | POA: Diagnosis present

## 2016-07-03 DIAGNOSIS — Z859 Personal history of malignant neoplasm, unspecified: Secondary | ICD-10-CM

## 2016-07-03 DIAGNOSIS — R29898 Other symptoms and signs involving the musculoskeletal system: Secondary | ICD-10-CM | POA: Diagnosis not present

## 2016-07-03 DIAGNOSIS — N170 Acute kidney failure with tubular necrosis: Secondary | ICD-10-CM | POA: Diagnosis not present

## 2016-07-03 DIAGNOSIS — I6310 Cerebral infarction due to embolism of unspecified precerebral artery: Secondary | ICD-10-CM | POA: Diagnosis not present

## 2016-07-03 DIAGNOSIS — I63432 Cerebral infarction due to embolism of left posterior cerebral artery: Secondary | ICD-10-CM | POA: Diagnosis not present

## 2016-07-03 DIAGNOSIS — Z951 Presence of aortocoronary bypass graft: Secondary | ICD-10-CM | POA: Diagnosis not present

## 2016-07-03 DIAGNOSIS — N390 Urinary tract infection, site not specified: Secondary | ICD-10-CM | POA: Diagnosis present

## 2016-07-03 DIAGNOSIS — T68XXXD Hypothermia, subsequent encounter: Secondary | ICD-10-CM | POA: Diagnosis not present

## 2016-07-03 DIAGNOSIS — N200 Calculus of kidney: Secondary | ICD-10-CM | POA: Diagnosis present

## 2016-07-03 DIAGNOSIS — I82409 Acute embolism and thrombosis of unspecified deep veins of unspecified lower extremity: Secondary | ICD-10-CM | POA: Diagnosis not present

## 2016-07-03 DIAGNOSIS — L8990 Pressure ulcer of unspecified site, unspecified stage: Secondary | ICD-10-CM | POA: Insufficient documentation

## 2016-07-03 HISTORY — DX: Psychophysical visual disturbances: H53.16

## 2016-07-03 HISTORY — DX: Stress incontinence (female) (male): N39.3

## 2016-07-03 LAB — URINALYSIS, MICROSCOPIC (REFLEX)

## 2016-07-03 LAB — URINALYSIS, ROUTINE W REFLEX MICROSCOPIC

## 2016-07-03 LAB — CBC WITH DIFFERENTIAL/PLATELET
Basophils Absolute: 0 10*3/uL (ref 0.0–0.1)
Basophils Relative: 0 %
EOS PCT: 0 %
Eosinophils Absolute: 0 10*3/uL (ref 0.0–0.7)
HEMATOCRIT: 42.8 % (ref 39.0–52.0)
Hemoglobin: 14.4 g/dL (ref 13.0–17.0)
LYMPHS ABS: 0.8 10*3/uL (ref 0.7–4.0)
LYMPHS PCT: 6 %
MCH: 29.8 pg (ref 26.0–34.0)
MCHC: 33.6 g/dL (ref 30.0–36.0)
MCV: 88.6 fL (ref 78.0–100.0)
MONO ABS: 0.8 10*3/uL (ref 0.1–1.0)
Monocytes Relative: 6 %
NEUTROS ABS: 11 10*3/uL — AB (ref 1.7–7.7)
Neutrophils Relative %: 88 %
PLATELETS: 130 10*3/uL — AB (ref 150–400)
RBC: 4.83 MIL/uL (ref 4.22–5.81)
RDW: 13.9 % (ref 11.5–15.5)
WBC: 12.6 10*3/uL — AB (ref 4.0–10.5)

## 2016-07-03 LAB — I-STAT TROPONIN, ED: TROPONIN I, POC: 0.01 ng/mL (ref 0.00–0.08)

## 2016-07-03 LAB — COMPREHENSIVE METABOLIC PANEL
ALT: 17 U/L (ref 17–63)
AST: 32 U/L (ref 15–41)
Albumin: 3.1 g/dL — ABNORMAL LOW (ref 3.5–5.0)
Alkaline Phosphatase: 191 U/L — ABNORMAL HIGH (ref 38–126)
Anion gap: 7 (ref 5–15)
BILIRUBIN TOTAL: 1.8 mg/dL — AB (ref 0.3–1.2)
BUN: 24 mg/dL — AB (ref 6–20)
CALCIUM: 8.9 mg/dL (ref 8.9–10.3)
CHLORIDE: 109 mmol/L (ref 101–111)
CO2: 22 mmol/L (ref 22–32)
Creatinine, Ser: 1.06 mg/dL (ref 0.61–1.24)
GFR, EST NON AFRICAN AMERICAN: 59 mL/min — AB (ref >60)
Glucose, Bld: 128 mg/dL — ABNORMAL HIGH (ref 65–99)
Potassium: 3.9 mmol/L (ref 3.5–5.1)
Sodium: 138 mmol/L (ref 135–145)
TOTAL PROTEIN: 5.9 g/dL — AB (ref 6.5–8.1)

## 2016-07-03 LAB — BILIRUBIN, FRACTIONATED(TOT/DIR/INDIR)
Bilirubin, Direct: 0.3 mg/dL (ref 0.1–0.5)
Indirect Bilirubin: 1.1 mg/dL — ABNORMAL HIGH (ref 0.3–0.9)
Total Bilirubin: 1.4 mg/dL — ABNORMAL HIGH (ref 0.3–1.2)

## 2016-07-03 LAB — CBG MONITORING, ED: Glucose-Capillary: 118 mg/dL — ABNORMAL HIGH (ref 65–99)

## 2016-07-03 MED ORDER — DEXTROSE 5 % IV SOLN
1.0000 g | INTRAVENOUS | Status: DC
  Administered 2016-07-03 – 2016-07-05 (×3): 1 g via INTRAVENOUS
  Filled 2016-07-03 (×5): qty 10

## 2016-07-03 MED ORDER — ACETAMINOPHEN 650 MG RE SUPP: 650.0000 mg | Freq: Four times a day (QID) | RECTAL | Status: DC | PRN

## 2016-07-03 MED ORDER — METOPROLOL SUCCINATE ER 50 MG PO TB24
50.0000 mg | ORAL_TABLET | Freq: Every day | ORAL | Status: DC
  Administered 2016-07-04: 50 mg via ORAL
  Filled 2016-07-03: qty 1

## 2016-07-03 MED ORDER — SODIUM CHLORIDE 0.9 % IV SOLN
INTRAVENOUS | Status: AC
  Administered 2016-07-03: 19:00:00 via INTRAVENOUS

## 2016-07-03 MED ORDER — ONDANSETRON HCL 4 MG/2ML IJ SOLN: 4.0000 mg | Freq: Four times a day (QID) | INTRAMUSCULAR | Status: DC | PRN

## 2016-07-03 MED ORDER — ATORVASTATIN CALCIUM 40 MG PO TABS
40.0000 mg | ORAL_TABLET | Freq: Every day | ORAL | Status: DC
  Administered 2016-07-03 – 2016-07-09 (×6): 40 mg via ORAL
  Filled 2016-07-03 (×5): qty 1

## 2016-07-03 MED ORDER — IOPAMIDOL (ISOVUE-370) INJECTION 76%
INTRAVENOUS | Status: AC
Start: 2016-07-03 — End: 2016-07-03
  Administered 2016-07-03: 100 mL
  Filled 2016-07-03: qty 100

## 2016-07-03 MED ORDER — ONDANSETRON HCL 4 MG PO TABS: 4.0000 mg | ORAL_TABLET | Freq: Four times a day (QID) | ORAL | Status: DC | PRN

## 2016-07-03 MED ORDER — BISACODYL 10 MG RE SUPP: 10.0000 mg | Freq: Every day | RECTAL | Status: DC | PRN

## 2016-07-03 MED ORDER — HEPARIN (PORCINE) IN NACL 100-0.45 UNIT/ML-% IJ SOLN
1000.0000 [IU]/h | INTRAMUSCULAR | Status: DC
  Administered 2016-07-03 – 2016-07-04 (×2): 1200 [IU]/h via INTRAVENOUS
  Filled 2016-07-03 (×2): qty 250

## 2016-07-03 MED ORDER — FENTANYL CITRATE (PF) 100 MCG/2ML IJ SOLN: 50.0000 ug | Freq: Once | INTRAMUSCULAR | Status: DC

## 2016-07-03 MED ORDER — HEPARIN BOLUS VIA INFUSION
4500.0000 [IU] | Freq: Once | INTRAVENOUS | Status: AC
  Administered 2016-07-03: 4500 [IU] via INTRAVENOUS
  Filled 2016-07-03: qty 4500

## 2016-07-03 MED ORDER — DORZOLAMIDE HCL-TIMOLOL MAL 2-0.5 % OP SOLN
1.0000 [drp] | Freq: Two times a day (BID) | OPHTHALMIC | Status: DC
  Administered 2016-07-03 – 2016-07-09 (×12): 1 [drp] via OPHTHALMIC
  Filled 2016-07-03 (×2): qty 10

## 2016-07-03 MED ORDER — SENNOSIDES-DOCUSATE SODIUM 8.6-50 MG PO TABS
1.0000 | ORAL_TABLET | Freq: Every evening | ORAL | Status: DC | PRN
  Administered 2016-07-06: 1 via ORAL
  Filled 2016-07-03 (×2): qty 1

## 2016-07-03 MED ORDER — SODIUM CHLORIDE 0.9 % IV SOLN: INTRAVENOUS | Status: DC

## 2016-07-03 MED ORDER — ACETAMINOPHEN 325 MG PO TABS
650.0000 mg | ORAL_TABLET | Freq: Four times a day (QID) | ORAL | Status: DC | PRN
  Administered 2016-07-03 – 2016-07-04 (×3): 650 mg via ORAL
  Filled 2016-07-03 (×3): qty 2

## 2016-07-03 MED ORDER — LATANOPROST 0.005 % OP SOLN
1.0000 [drp] | Freq: Every day | OPHTHALMIC | Status: DC
  Administered 2016-07-03 – 2016-07-09 (×7): 1 [drp] via OPHTHALMIC
  Filled 2016-07-03: qty 2.5

## 2016-07-03 MED ORDER — HYDROCODONE-ACETAMINOPHEN 5-325 MG PO TABS: 1.0000 | ORAL_TABLET | ORAL | Status: DC | PRN

## 2016-07-03 NOTE — Progress Notes (Signed)
ANTICOAGULATION CONSULT NOTE - Initial Consult  Pharmacy Consult for heparin Indication: pulmonary embolus  No Known Allergies  Patient Measurements: Height: 5\' 10"  (177.8 cm) Weight: 180 lb (81.6 kg) IBW/kg (Calculated) : 73 Heparin Dosing Weight: 82  Vital Signs: Temp: 98.4 F (36.9 C) (06/16 0723) Temp Source: Axillary (06/16 0723) BP: 132/96 (06/16 1600) Pulse Rate: 80 (06/16 1600)  Labs:  Recent Labs  07/03/16 0819  HGB 14.4  HCT 42.8  PLT 130*  CREATININE 1.06    Estimated Creatinine Clearance: 46.9 mL/min (by C-G formula based on SCr of 1.06 mg/dL).  Assessment: CC/HPI: 83 yo m presenting with back pain - cta shows PE  PMH: CKD, HLD, HTN, Prostate Ca, Hx of CABG  Anticoag: none pta - iv hep for acute PE  Renal: Scr 1.06  Heme/Onc: H&H 14.4/42.8, Plt 130 Hematuria but hgb stable  Goal of Therapy:  Heparin level 0.3-0.7 units/ml Monitor platelets by anticoagulation protocol: Yes   Plan:  Heparin bolus 4500 units x 1 Heparin drip 1200 units/hr Initial HL 0000 Daily HL, CBC  Levester Fresh, PharmD, BCPS, BCCCP Clinical Pharmacist Clinical phone for 07/03/2016 from 7a-3:30p: [phone] If after 3:30p, please call main pharmacy at: [phone] 07/03/2016 4:23 PM

## 2016-07-03 NOTE — Consult Note (Signed)
NEURO HOSPITALIST CONSULT NOTE   Requestig physician: Dr. Lita Mains  Reason for Consult: Lower extremity weakness  History obtained from:  Patient and Chart     HPI:                                                                                                                                          Luis Pierce is an 83 y.o. male who presented via EMS from home for weakness first noticed at 0545 on awakening. The patient stated that he could not feel his legs and slid to the floor. He also felt that his speech was slurred during the episode. He was assisted to his bed by his daughter and had a brief moment of chest pain during the incident. Also had chest pain last night. His LKN was last night.   Per ED note, at the time of their evaluation he was still unable to lift legs off bed, but was able to move left knee. Per patient his speech was at baseline and he was alert and oriented x 4. Of note, he has a history of glaucoma and is legally blind.    CT head: 1. No evidence for acute  abnormality. 2. Atrophy and small vessel disease  CT thoracic spine: Advanced degenerative changes involving the thoracic spine but no fracture or bone lesion.  CT chest revealed a PE: 1. Right-sided pulmonary embolism without findings for right heart strain.2. Tortuosity, ectasia and atherosclerotic calcifications involving the thoracic aorta but no dissection. 3. Three-vessel coronary artery calcifications and surgical changes from coronary artery bypass surgery. 4. Bibasilar atelectasis but no definite infiltrates, edema or effusions.  EKG: No atrial fibrillation  MRI held in order to clarify whether or not the patient's ocular prosthesis is safe for MRI.   Home medications include ASA and atorvastatin.   Past Medical History:  Diagnosis Date  . Blind left eye    retinal detached retina  . Chronic kidney disease    hx kidney stones and urinary incontinence  . Colon cancer  ([place])   . Coronary artery disease   . Diverticulosis   . DJD (degenerative joint disease)   . Glaucoma   . Hyperlipemia   . Hypertension   . Lipoma of back   . Melanoma of back ([place])   . Mild mitral regurgitation   . Prostate cancer ([place])   . Retinal detachment   . Urinary incontinence     Past Surgical History:  Procedure Laterality Date  . APPENDECTOMY    . CARDIAC CATHETERIZATION    . CHOLECYSTECTOMY    . CORONARY ARTERY BYPASS GRAFT  1982   1 vessel  . CYSTOSCOPY  09/21/2011   Procedure: CYSTOSCOPY;  Surgeon: Reece Packer, MD;  Location: [facility];  Service:  Urology;  Laterality: N/A;  . EYE SURGERY     detached retinas bilateral,bil.cataract  . TONSILLECTOMY     as child  . URINARY SPHINCTER IMPLANT  09/21/2011   Procedure: ARTIFICIAL URINARY SPHINCTER;  Surgeon: Reece Packer, MD;  Location: [facility];  Service: Urology;  Laterality: N/A;   History reviewed. No pertinent family history.  Social History:  reports that he has never smoked. He has never used smokeless tobacco. He reports that he drinks about 0.6 oz of alcohol per week . He reports that he does not use drugs.  No Known Allergies  HOME MEDICATIONS:                                                                                                                     aspirin EC 81 MG tablet Take 81 mg by mouth daily. [provider] Needs Review  atorvastatin (LIPITOR) 40 MG tablet Take 40 mg by mouth daily.  [provider] Needs Review  dorzolamide-timolol (COSOPT) 22.3-6.8 MG/ML ophthalmic solution Place 1 drop into the right eye 2 (two) times daily. [provider] Needs Review  latanoprost (XALATAN) 0.005 % ophthalmic solution Place 1 drop into the right eye at bedtime. [provider] Needs Review  metoprolol succinate (TOPROL-XL) 50 MG 24 hr tablet Take 50 mg by mouth daily after breakfast. Take with or immediately following a meal. [provider] Needs  Review   ROS:                                                                                                                                       As per HPI.    Blood pressure 132/88, pulse 69, temperature 98.4 F (36.9 C), temperature source Axillary, resp. rate 15, height 5\' 10"  (1.778 m), weight 81.6 kg (180 lb), SpO2 (!) 86 %.   General Examination:  HEENT-  [place]/AT  Lungs- Respirations unlabored Extremities- Warm and well perfused  Neurological Examination Mental Status: Awake and alert. Able to answer questions regarding his symptoms fluently. Able to follow all commands. Oriented to time and place.  Cranial Nerves: II: Blind OS, severely diminished acuity OD - able to detect light/dark only with visual hallucinations noted by patient during exam. Also with confabulation regarding presented visual phenomena - consistent with history of Charles-Bonnet syndrome. Right pupil reactive to light. Left pupil unreactive to light with RAPD noted. III,IV, VI: ptosis not present, has difficulty with EOM in the context of binocular blindness  V,VII: smile symmetric, facial temp sensation normal bilaterally VIII: hearing intact to voice IX,X: no hypophonia XI: symmetric XII: midline tongue extension Motor: Bilateral upper extremities: 5/5 symmetrically Bilateral lower extremities: Will not move to command, but will withdraw from noxious with 3-4/5 strength of proximal and distal muscle groups bilaterally, without asymmetry. The patient denies that he is able to volitionally move his legs during noxious stimuli, while moving them vigorously. He does not answer questions to elaborate regarding elicitable leg movement. The movement appeard volitional: it is irregular, mutidirectional, non-stereotyped and not consistent with a triple flexion response - the patient exclaims in discomfort during the  noxious stimuli used to provoke the leg movements but seems to be "cortically blind" to the leg movements and stimulus themselves, refusing or being unable to answer questions about it. Sensory:  Gives inconsistent answers regarding diminished fine touch sensation to lower extremities and hips. Subjectively normal FT and temperature sensation BUE. Deep Tendon Reflexes: Trace biceps, brachioradialis, patellar and achilles reflexes bilaterally without asymmetry.  Plantars: Right: downgoing   Left: downgoing Cerebellar: No ataxia with RAM or with targeted arm movements bilaterally Gait: Deferred   Lab Results: Basic Metabolic Panel:  Recent Labs Lab 07/03/16 0819  NA 138  K 3.9  CL 109  CO2 22  GLUCOSE 128*  BUN 24*  CREATININE 1.06  CALCIUM 8.9    Liver Function Tests:  Recent Labs Lab 07/03/16 0819  AST 32  ALT 17  ALKPHOS 191*  BILITOT 1.8*  PROT 5.9*  ALBUMIN 3.1*   No results for input(s): LIPASE, AMYLASE in the last 168 hours. No results for input(s): AMMONIA in the last 168 hours.  CBC:  Recent Labs Lab 07/03/16 0819  WBC 12.6*  NEUTROABS 11.0*  HGB 14.4  HCT 42.8  MCV 88.6  PLT 130*    Cardiac Enzymes: No results for input(s): CKTOTAL, CKMB, CKMBINDEX, TROPONINI in the last 168 hours.  Lipid Panel: No results for input(s): CHOL, TRIG, HDL, CHOLHDL, VLDL, LDLCALC in the last 168 hours.  CBG:  Recent Labs Lab 07/03/16 1516  GLUCAP 118*    Microbiology: Results for orders placed or performed during the hospital encounter of 09/13/11  Surgical pcr screen     Status: None   Collection Time: 09/13/11 12:35 PM  Result Value Ref Range Status   MRSA, PCR NEGATIVE NEGATIVE Final   Staphylococcus aureus NEGATIVE NEGATIVE Final    Comment:        The Xpert SA Assay (FDA approved for NASAL specimens in patients over 20 years of age), is one component of a comprehensive surveillance program.  Test performance has been validated by [employer]  for patients greater than or equal to 71 year old. It is not intended to diagnose infection nor to guide or monitor treatment.    Coagulation Studies: No results for input(s): LABPROT, INR in the last 72 hours.  Imaging: Ct Head Wo Contrast  Result Date: 07/03/2016 CLINICAL DATA:  States during this episode today he felt like his speech was slurred and his bilateral legs could not move Pt states that when woke up this morning his bilateral legs could not move and he felt his speech was slurred. EXAM: CT HEAD WITHOUT CONTRAST TECHNIQUE: Contiguous axial images were obtained from the base of the skull through the vertex without intravenous contrast. COMPARISON:  None. FINDINGS: Brain: There is moderate central and cortical atrophy. Periventricular white matter changes are consistent with small vessel disease. There is no intra or extra-axial fluid collection or mass lesion. The basilar cisterns and ventricles have a normal appearance. There is no CT evidence for acute infarction or hemorrhage. Vascular: There is atherosclerotic calcification of the carotid siphons. Skull: Normal. Negative for fracture or focal lesion. Sinuses/Orbits: No acute finding.  Bilateral scleral banding. Other: None IMPRESSION: 1.  No evidence for acute  abnormality. 2. Atrophy and small vessel disease. Electronically Signed   By: Nolon Nations M.D.   On: 07/03/2016 10:07   Ct T-spine No Charge  Result Date: 07/03/2016 CLINICAL DATA:  Back pain for 2 days. EXAM: CT THORACIC SPINE WITHOUT CONTRAST TECHNIQUE: Multidetector CT images of the thoracic were obtained using the standard protocol without intravenous contrast. COMPARISON:  None. FINDINGS: The thoracic vertebral bodies are normally aligned. There are advanced degenerative changes with anterior bridging osteophytes and probable changes of DISH. No acute fracture. No bone lesion. No spinal canal compromise. As noted on today's chest CT there are right-sided pulmonary  emboli. Bibasilar atelectasis. IMPRESSION: Advanced degenerative changes involving the thoracic spine but no fracture or bone lesion. Right-sided pulmonary emboli and bibasilar atelectasis. Electronically Signed   By: Marijo Sanes M.D.   On: 07/03/2016 14:52   Dg Chest Port 1 View  Result Date: 07/03/2016 CLINICAL DATA:  Chest pain EXAM: PORTABLE CHEST 1 VIEW COMPARISON:  09/13/2011 FINDINGS: Previous median sternotomy CABG procedure. No pleural effusion or edema. No airspace opacities. IMPRESSION: 1. No acute cardiopulmonary abnormalities. Electronically Signed   By: Kerby Moors M.D.   On: 07/03/2016 09:00   Ct Angio Chest Aorta W And/or Wo Contrast  Result Date: 07/03/2016 CLINICAL DATA:  Back and chest pain. EXAM: CT ANGIOGRAPHY CHEST WITH CONTRAST TECHNIQUE: Multidetector CT imaging of the chest was performed using the standard protocol during bolus administration of intravenous contrast. Multiplanar CT image reconstructions and MIPs were obtained to evaluate the vascular anatomy. CONTRAST:  100 cc Isovue 370 COMPARISON:  None. FINDINGS: Cardiovascular: The heart is upper limits of normal in size for age. No pericardial effusion. There is tortuosity, ectasia and mild atherosclerotic calcifications involving the thoracic aorta. No focal aneurysm or dissection. Mild fusiform enlargement of the ascending aorta measuring a maximum of 39.5 mm on image number 57. The pulmonary arterial tree is fairly well opacified. There is moderate right-sided filling defects consistent with pulmonary embolism. No findings to suggest right heart strain. The RV LV ratio is normal. Coronary artery calcifications and surgical changes from bypass surgery. Mediastinum/Nodes: Mediastinal and hilar lymph nodes are noted. Right hilar node on image number 50 measures 25.5 x 15 mm. The esophagus is grossly. Lungs/Pleura: Moderate patchy bilateral lower lobe atelectasis without definite infiltrate, effusion or edema. No worrisome  pulmonary lesions. Upper Abdomen: No significant upper abdominal findings. The abdominal aorta is normal in caliber. No dissection. Scattered atherosclerotic calcifications. Moderate tortuosity distally. Musculoskeletal: No significant bony findings. Advanced degenerative changes involving the thoracic spine but no  bone lesion or fracture. Sternal wires related to prior bypass surgery. The Review of the MIP images confirms the above findings. IMPRESSION: 1. Right-sided pulmonary embolism without findings for right heart strain. 2. Tortuosity, ectasia and atherosclerotic calcifications involving the thoracic aorta but no dissection. 3. Three-vessel coronary artery calcifications and surgical changes from coronary artery bypass surgery. 4. Bibasilar atelectasis but no definite infiltrates, edema or effusions. Aortic Atherosclerosis (ICD10-I70.0). Electronically Signed   By: Marijo Sanes M.D.   On: 07/03/2016 14:51    Assessment: 83 year old male with PE, chest pain, back pain and unusual presentation of lower extremity weakness 1. Exam does not localize to the cauda equina or conus medullaris given presence of sensation in saddle region with good rectal tone and ability to sense urge to micturate, as well as motor function in proximal and distal lower extremities that is rated at least 3-4/5.  2. Unusual features of exam suggesting a cognitive component, possibly secondary to parietal strokes and/or conversion disorder or a disconnection syndrome: Will not move bilateral lower extremities to command, but will withdraw from noxious with 3-4/5 strength of proximal and distal muscle groups bilaterally, without asymmetry. The patient denies that he is able to volitionally move his legs during noxious stimuli, while at that time moving them vigorously. He does not answer questions to elaborate regarding elicitable leg movement. The movement appeard volitional: it is irregular, mutidirectional, non-stereotyped and not  consistent with a triple flexion response - the patient exclaims in discomfort during the noxious stimuli used to provoke the leg movements but seems to be "cortically blind" to the leg movements and stimulus themselves, refusing or being unable to answer questions about it. An MRI brain and thoracic spine will be needed to further evaluate - the thoracic spine is expected to be low-yield given exam findings that are not consistently localizable to the spinal cord. CT thoracic spine showed advanced degenerative changes but no compression fracture.  3. Charles-Bonnet syndrome 4. History of scleral banding on the right is apparent by CT. The family does not have a complete history of the patient's eye procedures and the patient is also unable to elaborate. In addition to the scleral band, there is a metallic foreign object adherent or implanted in/on the right globe, as determined by the CT density. This finding most likely represents an old glaucoma device per my discussion with [facility]. It may be ferromagnetic. Family made several phone calls to the patient's various current and prior ophthalmologists/ophthalmology practices, with no consistent answers given regarding the metallic right globe implant. Another attempt will need to be made tomorrow. One of the practices, at [facility], can be reached at [phone] [facility]). He also has been seen by [facility]. 5. PE 6. Bloody urethral discharge/hematuria  Recommendations: 1. MRI brain and thoracic spine if able to be obtained following identification of metallic right globe instrumentation seen on CT head as ferromagnetic vs non-ferromagnetic 2. PT consult 3. Close monitoring 4. Will need urology consult and bladder scans 5. Management of PE as per primary team  Electronically signed: Dr. Kerney Elbe 07/03/2016, 3:33 PM

## 2016-07-03 NOTE — ED Notes (Signed)
Attempted report x 1, had to end call to speak to admitting.  Attempted report again, RN unavailable

## 2016-07-03 NOTE — Progress Notes (Signed)
As of this time, Dr. Carlis Abbott does not want to proceed with MRI unless patient has more information on eye implant. RN notified.

## 2016-07-03 NOTE — ED Notes (Signed)
Clot noted in underwear.

## 2016-07-03 NOTE — ED Notes (Signed)
Got patient undress on the monitor did ekg shown to Dr Christy Gentles

## 2016-07-03 NOTE — ED Notes (Signed)
Patient transported to CT 

## 2016-07-03 NOTE — H&P (Signed)
History and Physical    Luis Pierce VXB:[id number] DOB: Jan 18, 1923 DOA: 07/03/2016   PCP: Lajean Manes, MD   Patient coming from:  Home    Chief Complaint: Generalized weakness  HPI: Luis Pierce is a 83 y.o. male with extensive medical history listed below, father of one of our physicians at [facility] system, including colon cancer, prostate cancer, s/p artificial sphincter due to stress incontinence, legally blind  HTN, HLD, MR, history of retinal detachment s/p coiling, brought by EMS from home after experiencing progressive lower extremity weakness, worse upon awakening this morning. He reports inability to feel his legs and slid to the floor without hitting his hear or losing consciousness. No confusion was reported .This was preceded by one episode of chest pain. No dysarthria or dysphagia. Denies bowel incontinence. Patient has a history of artificial sphincter and was noted gross hematuria without clots in the urinal . No other areas of bleeding are noted He denies fever, chills, nausea or vomiting . Denies lower extremity swelling. He is not active at home. He does take daily ASA   ED Course:  BP (!) 161/76   Pulse 69   Temp 98.4 F (36.9 C) (Axillary)   Resp 16   Ht 5' 10" (1.778 m)   Wt 81.6 kg (180 lb)   SpO2 96%   BMI 25.83 kg/m   CT  of the head negative for acute abnormality  CT T spine, without acute fractures or other abnormalities such as bone lesions CT L spine pending  CT of the chest revealed incidental right sided PE without evidence of right heart strain. No dissection was noted. EKG sinus rhythm without any abnormalities of note, MR I have to spine was held due to possible ocular prosthesis EKG: No atrial fibrillation Neuro evaluation is pending  sodium 138 potassium 3.9 glucose 128 creatinine 1.06 alkaline phosphatase 191 T bilirubin 1.8   Review of Systems:  As per HPI otherwise all other systems reviewed and are negative  Past Medical History:    Diagnosis Date  . Blind left eye    retinal detached retina  . Chronic kidney disease    hx kidney stones and urinary incontinence  . Colon cancer ([place])   . Coronary artery disease   . Diverticulosis   . DJD (degenerative joint disease)   . Glaucoma   . Hyperlipemia   . Hypertension   . Lipoma of back   . Melanoma of back ([place])   . Mild mitral regurgitation   . Prostate cancer ([place])   . Retinal detachment   . Urinary incontinence     Past Surgical History:  Procedure Laterality Date  . APPENDECTOMY    . CARDIAC CATHETERIZATION    . CHOLECYSTECTOMY    . CORONARY ARTERY BYPASS GRAFT  1982   1 vessel  . CYSTOSCOPY  09/21/2011   Procedure: CYSTOSCOPY;  Surgeon: Reece Packer, MD;  Location: [facility];  Service: Urology;  Laterality: N/A;  . EYE SURGERY     detached retinas bilateral,bil.cataract  . TONSILLECTOMY     as child  . URINARY SPHINCTER IMPLANT  09/21/2011   Procedure: ARTIFICIAL URINARY SPHINCTER;  Surgeon: Reece Packer, MD;  Location: [facility];  Service: Urology;  Laterality: N/A;    Social History Social History   Social History  . Marital status: Married    Spouse name: N/A  . Number of children: 3  . Years of education: N/A   Occupational History  . 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 Smoking status: Never Smoker  . Smokeless tobacco: Never Used  . Alcohol use 0.6 oz/week    1 Glasses of wine per week     Comment: rarely  . Drug use: No  . Sexual activity: Not on file   Other Topics Concern  . Not on file   Social History Narrative  . No narrative on file     No Known Allergies  History reviewed. No pertinent family history.    Prior to Admission medications   Medication Sig Start Date End Date Taking? Authorizing Provider  aspirin EC 81 MG tablet Take 81 mg by mouth daily.   Yes [provider]  atorvastatin (LIPITOR) 40 MG tablet Take 40 mg by mouth daily.    Yes [provider]   dorzolamide-timolol (COSOPT) 22.3-6.8 MG/ML ophthalmic solution Place 1 drop into the right eye 2 (two) times daily. 08/18/15  Yes [provider]  latanoprost (XALATAN) 0.005 % ophthalmic solution Place 1 drop into the right eye at bedtime.   Yes [provider]  metoprolol succinate (TOPROL-XL) 50 MG 24 hr tablet Take 50 mg by mouth daily after breakfast. Take with or immediately following a meal.   Yes [provider]    Physical Exam:  Vitals:   07/03/16 1500 07/03/16 1530 07/03/16 1600 07/03/16 1700  BP: 132/88 (!) 125/99 (!) 132/96 (!) 161/76  Pulse: 69 78 80 69  Resp:  (!) 21 (!) 22 16  Temp:      TempSrc:      SpO2: (!) 86% 95% 98% 96%  Weight:      Height:       Constitutional: NAD, calm, comfortable  Eyes: PERRL, lids and conjunctivae normal ENMT: Mucous membranes are moist, without exudate or lesions  Neck: normal, supple, no masses, no thyromegaly Respiratory: clear to auscultation bilaterally, no wheezing, no crackles. Normal respiratory effort  Cardiovascular: Regular rate and rhythm, no murmurs, rubs or gallops. Trace bilateral lower extremity edema. 2+ pedal pulses. No carotid bruits.  Abdomen: Soft, mildly tender to palpation on the suprapubic area  , No hepatosplenomegaly. Bowel sounds positive.  Musculoskeletal: no clubbing / cyanosis.  Cannot feel spinal tenderness on palpation  Skin: no jaundice, No lesions.  Neurologic:   Left lower extremity weaker, able to move toes. Sensation is normal  Psychiatric:   Alert and oriented x 3. Normal mood.     Labs on Admission: I have personally reviewed following labs and imaging studies  CBC:  Recent Labs Lab 07/03/16 0819  WBC 12.6*  NEUTROABS 11.0*  HGB 14.4  HCT 42.8  MCV 88.6  PLT 130*    Basic Metabolic Panel:  Recent Labs Lab 07/03/16 0819  NA 138  K 3.9  CL 109  CO2 22  GLUCOSE 128*  BUN 24*  CREATININE 1.06  CALCIUM 8.9    GFR: Estimated Creatinine Clearance:  46.9 mL/min (by C-G formula based on SCr of 1.06 mg/dL).  Liver Function Tests:  Recent Labs Lab 07/03/16 0819  AST 32  ALT 17  ALKPHOS 191*  BILITOT 1.8*  PROT 5.9*  ALBUMIN 3.1*   No results for input(s): LIPASE, AMYLASE in the last 168 hours. No results for input(s): AMMONIA in the last 168 hours.  Coagulation Profile: No results for input(s): INR, PROTIME in the last 168 hours.  Cardiac Enzymes: No results for input(s): CKTOTAL, CKMB, CKMBINDEX, TROPONINI in the last 168 hours.  BNP (last 3  results) No results for input(s): PROBNP in the last 8760 hours.  HbA1C: No results for input(s): HGBA1C in the last 72 hours.  CBG:  Recent Labs Lab 07/03/16 1516  GLUCAP 118*    Lipid Profile: No results for input(s): CHOL, HDL, LDLCALC, TRIG, CHOLHDL, LDLDIRECT in the last 72 hours.  Thyroid Function Tests: No results for input(s): TSH, T4TOTAL, FREET4, T3FREE, THYROIDAB in the last 72 hours.  Anemia Panel: No results for input(s): VITAMINB12, FOLATE, FERRITIN, TIBC, IRON, RETICCTPCT in the last 72 hours.  Urine analysis:    Component Value Date/Time   COLORURINE RED (A) 07/03/2016 1516   APPEARANCEUR TURBID (A) 07/03/2016 1516   LABSPEC  07/03/2016 1516    TEST NOT REPORTED DUE TO COLOR INTERFERENCE OF URINE PIGMENT   PHURINE  07/03/2016 1516    TEST NOT REPORTED DUE TO COLOR INTERFERENCE OF URINE PIGMENT   GLUCOSEU (A) 07/03/2016 1516    TEST NOT REPORTED DUE TO COLOR INTERFERENCE OF URINE PIGMENT   HGBUR (A) 07/03/2016 1516    TEST NOT REPORTED DUE TO COLOR INTERFERENCE OF URINE PIGMENT   BILIRUBINUR (A) 07/03/2016 1516    TEST NOT REPORTED DUE TO COLOR INTERFERENCE OF URINE PIGMENT   KETONESUR (A) 07/03/2016 1516    TEST NOT REPORTED DUE TO COLOR INTERFERENCE OF URINE PIGMENT   PROTEINUR (A) 07/03/2016 1516    TEST NOT REPORTED DUE TO COLOR INTERFERENCE OF URINE PIGMENT   NITRITE (A) 07/03/2016 1516    TEST NOT REPORTED DUE TO COLOR INTERFERENCE OF URINE  PIGMENT   LEUKOCYTESUR (A) 07/03/2016 1516    TEST NOT REPORTED DUE TO COLOR INTERFERENCE OF URINE PIGMENT    Sepsis Labs: _0 (procalcitonin:4,lacticidven:4) )No results found for this or any previous visit (from the past 240 hour(s)).   Radiological Exams on Admission: Ct Head Wo Contrast  Result Date: 07/03/2016 CLINICAL DATA:  States during this episode today he felt like his speech was slurred and his bilateral legs could not move Pt states that when woke up this morning his bilateral legs could not move and he felt his speech was slurred. EXAM: CT HEAD WITHOUT CONTRAST TECHNIQUE: Contiguous axial images were obtained from the base of the skull through the vertex without intravenous contrast. COMPARISON:  None. FINDINGS: Brain: There is moderate central and cortical atrophy. Periventricular white matter changes are consistent with small vessel disease. There is no intra or extra-axial fluid collection or mass lesion. The basilar cisterns and ventricles have a normal appearance. There is no CT evidence for acute infarction or hemorrhage. Vascular: There is atherosclerotic calcification of the carotid siphons. Skull: Normal. Negative for fracture or focal lesion. Sinuses/Orbits: No acute finding.  Bilateral scleral banding. Other: None IMPRESSION: 1.  No evidence for acute  abnormality. 2. Atrophy and small vessel disease. Electronically Signed   By: Nolon Nations M.D.   On: 07/03/2016 10:07   Ct T-spine No Charge  Result Date: 07/03/2016 CLINICAL DATA:  Back pain for 2 days. EXAM: CT THORACIC SPINE WITHOUT CONTRAST TECHNIQUE: Multidetector CT images of the thoracic were obtained using the standard protocol without intravenous contrast. COMPARISON:  None. FINDINGS: The thoracic vertebral bodies are normally aligned. There are advanced degenerative changes with anterior bridging osteophytes and probable changes of DISH. No acute fracture. No bone lesion. No spinal canal compromise. As  noted on today's chest CT there are right-sided pulmonary emboli. Bibasilar atelectasis. IMPRESSION: Advanced degenerative changes involving the thoracic spine but no fracture or bone lesion. Right-sided pulmonary emboli and  bibasilar atelectasis. Electronically Signed   By: Marijo Sanes M.D.   On: 07/03/2016 14:52   Dg Chest Port 1 View  Result Date: 07/03/2016 CLINICAL DATA:  Chest pain EXAM: PORTABLE CHEST 1 VIEW COMPARISON:  09/13/2011 FINDINGS: Previous median sternotomy CABG procedure. No pleural effusion or edema. No airspace opacities. IMPRESSION: 1. No acute cardiopulmonary abnormalities. Electronically Signed   By: Kerby Moors M.D.   On: 07/03/2016 09:00   Ct Angio Chest Aorta W And/or Wo Contrast  Result Date: 07/03/2016 CLINICAL DATA:  Back and chest pain. EXAM: CT ANGIOGRAPHY CHEST WITH CONTRAST TECHNIQUE: Multidetector CT imaging of the chest was performed using the standard protocol during bolus administration of intravenous contrast. Multiplanar CT image reconstructions and MIPs were obtained to evaluate the vascular anatomy. CONTRAST:  100 cc Isovue 370 COMPARISON:  None. FINDINGS: Cardiovascular: The heart is upper limits of normal in size for age. No pericardial effusion. There is tortuosity, ectasia and mild atherosclerotic calcifications involving the thoracic aorta. No focal aneurysm or dissection. Mild fusiform enlargement of the ascending aorta measuring a maximum of 39.5 mm on image number 57. The pulmonary arterial tree is fairly well opacified. There is moderate right-sided filling defects consistent with pulmonary embolism. No findings to suggest right heart strain. The RV LV ratio is normal. Coronary artery calcifications and surgical changes from bypass surgery. Mediastinum/Nodes: Mediastinal and hilar lymph nodes are noted. Right hilar node on image number 50 measures 25.5 x 15 mm. The esophagus is grossly. Lungs/Pleura: Moderate patchy bilateral lower lobe atelectasis  without definite infiltrate, effusion or edema. No worrisome pulmonary lesions. Upper Abdomen: No significant upper abdominal findings. The abdominal aorta is normal in caliber. No dissection. Scattered atherosclerotic calcifications. Moderate tortuosity distally. Musculoskeletal: No significant bony findings. Advanced degenerative changes involving the thoracic spine but no bone lesion or fracture. Sternal wires related to prior bypass surgery. The Review of the MIP images confirms the above findings. IMPRESSION: 1. Right-sided pulmonary embolism without findings for right heart strain. 2. Tortuosity, ectasia and atherosclerotic calcifications involving the thoracic aorta but no dissection. 3. Three-vessel coronary artery calcifications and surgical changes from coronary artery bypass surgery. 4. Bibasilar atelectasis but no definite infiltrates, edema or effusions. Aortic Atherosclerosis (ICD10-I70.0). Electronically Signed   By: Marijo Sanes M.D.   On: 07/03/2016 14:51    EKG: Independently reviewed.  Assessment/Plan Active Problems:   Pulmonary embolism (HCC)   Generalized weakness   Hypertension   Hyperlipidemia   Hematuria   Bilateral leg weakness   Generalized weakness, rule out Neurological abnormality versus infection CT  of the head negative for acute abnormality CT T spine, without acute fractures or other abnormalities such as bone lesions CT L spine pending  UA is equivocal. WBC 12  Neuro evaluation is appreciated OT/PT when out of bed rest  Rocephin IV as below  Blood culture and Urine culture   Acute Pulmonary embolus. CTA positive for incidental PE Right-sided pulmonary embolism without findings for right heart strain.  EKG SR    Tn   0.01 VSS  Afebrile  . Admit to Inpatient tele   Heparin per pharmacy   2Decho Doppler LE  Hematuria, likely mechanical vs. UTI , in the setting of artificial sphincter placed for stress incontinence on 2013 by Urologist. Hb 14 .Spoke with  Urologist, Dr Jonny Ruiz, who recomends to continue to flush intermittently, as long as the cath is able to drain urine, in view of the imminent need for anticoagulation for the treatment of PE .  Appreciate their input  Check CBC in am  Rocephin IV    Hypertension BP   161/76   Pulse 69   Controlled Continue home anti-hypertensive medications    Hyperlipidemia Continue home statins    Abnormal bilirubin 1.8    History of Coon and Prostate Ca. No history of  hepatitis per chart  Last Alk Phos 191  . Bili  1.8  Check  Fractionated Bili   Gentle IVF   Repeat CMET In am  Will consider imaging if Bili continues to increase or abnormal above  Labs      DVT prophylaxis:  Heparin per pharmacy  Code Status:   Full   Family Communication:  Discussed with patient and family  Disposition Plan: Expect patient to be discharged to home after condition improves Consults called:  Urology per phone. Neurology per EDP     Admission status:Tele [place] E, PA-C [facility]   07/03/2016, 5:23 PM

## 2016-07-03 NOTE — Progress Notes (Signed)
Ceftriaxone for UTI per pharmacy ordered.  Ceftriaxone does not need renal adjustment.  [employer] policy allows pharmacy to change the ordered dose based on indication without contacting the provider, therefore a consult is not required.  Plan: -ceftriaxone 1g IV q24h -pharmacy to sign off as no adjustment needed.    Malek Skog D. Felcia Huebert, PharmD, BCPS Clinical Pharmacist 07/03/2016 5:33 PM

## 2016-07-03 NOTE — ED Triage Notes (Signed)
Pt. Coming from home via [facility] for weakness starting around 0545 when he woke up. Pt. Couldn't feel his legs and slid into the floor. Pt. Assisted to bed by daughter. Pt. Had brief moment of CP during incident and last night. Pt. LKN last night. Pt. States during this episode today he felt like his speech was slurred and his bilateral legs could not move. Sensory intact. Pt. Still unable to lift legs off bed, but is able to move left knee some. Pt. Speech at baseline. Pt. Very HOH. Pt. Aox4. Pt. Hx of glaucoma and legally blind. Pt. Daughter at bedside.

## 2016-07-03 NOTE — ED Notes (Signed)
Neurology at bedside.

## 2016-07-03 NOTE — ED Provider Notes (Signed)
[facility] DEPT Provider Note   CSN: [id number] Arrival date & time: 07/03/16  4332     History   Chief Complaint Chief Complaint  Patient presents with  . Weakness  . Chest Pain    HPI Luis Pierce is a 83 y.o. male.  HPI Patient's had several days of upper midline back pain. No recent falls. Normally is able ambulate without assistance. Went to bed yesterday evening his normal state of health. Try to get up this morning at 0545 and states he cannot feel or move his legs. He slid into the floor. Suspect a bed by his daughter. Patient states that the pain in his back and anterior chest is worse with deep breathing and movement. Denies recent cough, fever or chills. Patient is assymptomatic while lying still.  Past Medical History:  Diagnosis Date  . Blind left eye    retinal detached retina  . Chronic kidney disease    hx kidney stones and urinary incontinence  . Colon cancer ([place])   . Coronary artery disease   . Diverticulosis   . DJD (degenerative joint disease)   . Glaucoma   . Hyperlipemia   . Hypertension   . Lipoma of back   . Melanoma of back ([place])   . Mild mitral regurgitation   . Prostate cancer ([place])   . Retinal detachment   . Urinary incontinence     There are no active problems to display for this patient.   Past Surgical History:  Procedure Laterality Date  . APPENDECTOMY    . CARDIAC CATHETERIZATION    . CHOLECYSTECTOMY    . CORONARY ARTERY BYPASS GRAFT  1982   1 vessel  . CYSTOSCOPY  09/21/2011   Procedure: CYSTOSCOPY;  Surgeon: Reece Packer, MD;  Location: [facility];  Service: Urology;  Laterality: N/A;  . EYE SURGERY     detached retinas bilateral,bil.cataract  . TONSILLECTOMY     as child  . URINARY SPHINCTER IMPLANT  09/21/2011   Procedure: ARTIFICIAL URINARY SPHINCTER;  Surgeon: Reece Packer, MD;  Location: [facility];  Service: Urology;  Laterality: N/A;       Home Medications    Prior to Admission medications     Medication Sig Start Date End Date Taking? Authorizing Provider  aspirin EC 81 MG tablet Take 81 mg by mouth daily.   Yes [provider]  atorvastatin (LIPITOR) 40 MG tablet Take 40 mg by mouth daily.    Yes [provider]  dorzolamide-timolol (COSOPT) 22.3-6.8 MG/ML ophthalmic solution Place 1 drop into the right eye 2 (two) times daily. 08/18/15  Yes [provider]  latanoprost (XALATAN) 0.005 % ophthalmic solution Place 1 drop into the right eye at bedtime.   Yes [provider]  metoprolol succinate (TOPROL-XL) 50 MG 24 hr tablet Take 50 mg by mouth daily after breakfast. Take with or immediately following a meal.   Yes [provider]    Family History History reviewed. No pertinent family history.  Social History Social History  Substance Use Topics  . Smoking status: Never Smoker  . Smokeless tobacco: Never Used  . Alcohol use 0.6 oz/week    1 Glasses of wine per week     Comment: rarely     Allergies   Patient has no known allergies.   Review of Systems Review of Systems  Constitutional: Negative for chills and fever.  Respiratory: Negative for cough and shortness of breath.   Cardiovascular: Positive for chest pain.  Negative for palpitations and leg swelling.  Gastrointestinal: Negative for abdominal pain, diarrhea and nausea.  Genitourinary: Positive for difficulty urinating. Negative for flank pain.  Musculoskeletal: Positive for back pain, gait problem and myalgias. Negative for neck pain and neck stiffness.  Skin: Negative for rash and wound.  Neurological: Positive for weakness and numbness. Negative for headaches.  Psychiatric/Behavioral: Negative for confusion.  All other systems reviewed and are negative.    Physical Exam Updated Vital Signs BP (!) 151/82   Pulse 74   Temp 98.4 F (36.9 C) (Axillary)   Resp 15   Ht 5\' 10"  (1.778 m)   Wt 81.6 kg (180 lb)   SpO2 94%   BMI 25.83 kg/m   Physical Exam   Constitutional: He is oriented to person, place, and time. He appears well-developed and well-nourished. No distress.  HENT:  Head: Normocephalic and atraumatic.  Mouth/Throat: Oropharynx is clear and moist. No oropharyngeal exudate.  Eyes: EOM are normal. Pupils are equal, round, and reactive to light.  Neck: Normal range of motion. Neck supple.  No posterior midline cervical tenderness to palpation.  Cardiovascular: Normal rate and regular rhythm.  Exam reveals no gallop and no friction rub.   No murmur heard. Pulmonary/Chest: Effort normal and breath sounds normal. No respiratory distress. He has no wheezes. He has no rales. He exhibits no tenderness.  Abdominal: Soft. Bowel sounds are normal. There is no tenderness. There is no rebound and no guarding.  Musculoskeletal: Normal range of motion. He exhibits tenderness. He exhibits no edema.  Patient has mild midline upper thoracic lumbar tenderness with deep palpation. There is no obvious deformity or injury. Distal pulses are 2+.  Lymphadenopathy:    He has no cervical adenopathy.  Neurological: He is alert and oriented to person, place, and time.  5/5 motor in bilateral upper extremities. Sensation intact. No facial asymmetry. Speaks with a clear voice. 1/5 motor to bilateral lower extremities. There is some movement of the toes of both feet. Decreased sensation to light touch in the left lower extremity. Unable to elicit bilateral patellar DTR's  Skin: Skin is warm and dry. Capillary refill takes less than 2 seconds. No rash noted. No erythema.  Psychiatric: He has a normal mood and affect. His behavior is normal.  Nursing note and vitals reviewed.    ED Treatments / Results  Labs (all labs ordered are listed, but only abnormal results are displayed) Labs Reviewed  CBC WITH DIFFERENTIAL/PLATELET - Abnormal; Notable for the following:       Result Value   WBC 12.6 (*)    Platelets 130 (*)    Neutro Abs 11.0 (*)    All other  components within normal limits  COMPREHENSIVE METABOLIC PANEL - Abnormal; Notable for the following:    Glucose, Bld 128 (*)    BUN 24 (*)    Total Protein 5.9 (*)    Albumin 3.1 (*)    Alkaline Phosphatase 191 (*)    Total Bilirubin 1.8 (*)    GFR calc non Af Amer 59 (*)    All other components within normal limits  URINALYSIS, ROUTINE W REFLEX MICROSCOPIC  I-STAT TROPOININ, ED    EKG  EKG Interpretation  Date/Time:  Saturday July 03 2016 07:19:03 EDT Ventricular Rate:  70 PR Interval:    QRS Duration: 105 QT Interval:  416 QTC Calculation: 449 R Axis:   11 Text Interpretation:  Sinus rhythm Confirmed by Lita Mains  MD, Raenell Mensing ([id number]) on 07/03/2016 11:17:44 AM  Radiology Ct Head Wo Contrast  Result Date: 07/03/2016 CLINICAL DATA:  States during this episode today he felt like his speech was slurred and his bilateral legs could not move Pt states that when woke up this morning his bilateral legs could not move and he felt his speech was slurred. EXAM: CT HEAD WITHOUT CONTRAST TECHNIQUE: Contiguous axial images were obtained from the base of the skull through the vertex without intravenous contrast. COMPARISON:  None. FINDINGS: Brain: There is moderate central and cortical atrophy. Periventricular white matter changes are consistent with small vessel disease. There is no intra or extra-axial fluid collection or mass lesion. The basilar cisterns and ventricles have a normal appearance. There is no CT evidence for acute infarction or hemorrhage. Vascular: There is atherosclerotic calcification of the carotid siphons. Skull: Normal. Negative for fracture or focal lesion. Sinuses/Orbits: No acute finding.  Bilateral scleral banding. Other: None IMPRESSION: 1.  No evidence for acute  abnormality. 2. Atrophy and small vessel disease. Electronically Signed   By: Nolon Nations M.D.   On: 07/03/2016 10:07   Dg Chest Port 1 View  Result Date: 07/03/2016 CLINICAL DATA:  Chest pain EXAM:  PORTABLE CHEST 1 VIEW COMPARISON:  09/13/2011 FINDINGS: Previous median sternotomy CABG procedure. No pleural effusion or edema. No airspace opacities. IMPRESSION: 1. No acute cardiopulmonary abnormalities. Electronically Signed   By: Kerby Moors M.D.   On: 07/03/2016 09:00    Procedures Procedures (including critical care time)  Medications Ordered in ED Medications  fentaNYL (SUBLIMAZE) injection 50 mcg (not administered)   CRITICAL CARE Performed by: Lita Mains, Zhanae Proffit Total critical care time: 40 minutes Critical care time was exclusive of separately billable procedures and treating other patients. Critical care was necessary to treat or prevent imminent or life-threatening deterioration. Critical care was time spent personally by me on the following activities: development of treatment plan with patient and/or surrogate as well as nursing, discussions with consultants, evaluation of patient's response to treatment, examination of patient, obtaining history from patient or surrogate, ordering and performing treatments and interventions, ordering and review of laboratory studies, ordering and review of radiographic studies, pulse oximetry and re-evaluation of patient's condition.   Initial Impression / Assessment and Plan / ED Course  I have reviewed the triage vital signs and the nursing notes.  Pertinent labs & imaging results that were available during my care of the patient were reviewed by me and considered in my medical decision making (see chart for details).     Patient with new weakness bilateral lower extremities. Discussed with Dr. Cheral Marker. Agrees with plan to get a thoracic MRI with contrast. Family has been updated.  Patient has reported metal in his eyes. Unable to get MRI. CT chest with thoracic spine obtained. No evidence of cord compression or thoracic fractures but PE and atelectasis identified. Patient was able to produce urine but it is grossly bloody. Questional  hemorrhagic cystitis. Discussed with neurology who will consult on the patient's in the emergency department. Also spoke with hospitalist who will admit patient. Given the fact no cause for the patient's neurologic symptoms are found we'll get CT of the lumbar spine. Family states patient seems more confused after coming back from CT. CBG within normal limits. Final Clinical Impressions(s) / ED Diagnoses   Final diagnoses:  Thoracic back pain  Bilateral leg weakness    New Prescriptions New Prescriptions   No medications on file     Julianne Rice, MD 07/03/16 1542

## 2016-07-03 NOTE — ED Notes (Signed)
Admitting at bedside 

## 2016-07-03 NOTE — ED Notes (Signed)
Pt. Returned from CT.

## 2016-07-03 NOTE — ED Notes (Signed)
EDP made aware that patient unable to give urine due to urethral device. Will bladder scan.

## 2016-07-03 NOTE — ED Notes (Signed)
Pt. Luis Pierce blood in urine. EDP made aware. Pt. Mental status change per family. CBG checked at this time.

## 2016-07-03 NOTE — Progress Notes (Signed)
Patient arrived on the unit from the ER on a stretcher, assessment completed see flowsheet, placed on tele ccmd notified, patient oriented to room and staff, bed in lowest position, call bell within reach will continue to monitor.

## 2016-07-03 NOTE — Progress Notes (Addendum)
The patient has a scleral band around right globe per family. The patient's daughter, who is an MD, has called two of the patient's ophthalmologists, who have verified that it is a scleral band and is MRI compatible.   However, after discussion with Dr. Toney Reil of neuroradiology, in addition to the scleral band there is a metallic device that appears most consistent with an old glaucoma device, most likely an older model since the newer devices are not metallic.   An MRI will not be performable unless the device model # is found, looked up and determined to be non-ferromagnetic, or it is removed or replaced with a plastic model prior to MRI.   Electronically signed: Dr. Kerney Elbe

## 2016-07-03 NOTE — ED Notes (Signed)
Pt. Family members think that patient mental status change due to not having anything to eat. Paged admitting.

## 2016-07-04 ENCOUNTER — Inpatient Hospital Stay (HOSPITAL_COMMUNITY): Payer: Medicare Other

## 2016-07-04 ENCOUNTER — Encounter (HOSPITAL_COMMUNITY): Payer: Self-pay | Admitting: Internal Medicine

## 2016-07-04 DIAGNOSIS — N393 Stress incontinence (female) (male): Secondary | ICD-10-CM

## 2016-07-04 DIAGNOSIS — E860 Dehydration: Secondary | ICD-10-CM | POA: Diagnosis present

## 2016-07-04 DIAGNOSIS — N390 Urinary tract infection, site not specified: Secondary | ICD-10-CM | POA: Diagnosis present

## 2016-07-04 DIAGNOSIS — M546 Pain in thoracic spine: Secondary | ICD-10-CM

## 2016-07-04 DIAGNOSIS — I2699 Other pulmonary embolism without acute cor pulmonale: Secondary | ICD-10-CM

## 2016-07-04 DIAGNOSIS — H5316 Psychophysical visual disturbances: Secondary | ICD-10-CM

## 2016-07-04 DIAGNOSIS — I10 Essential (primary) hypertension: Secondary | ICD-10-CM

## 2016-07-04 DIAGNOSIS — E785 Hyperlipidemia, unspecified: Secondary | ICD-10-CM

## 2016-07-04 DIAGNOSIS — E538 Deficiency of other specified B group vitamins: Secondary | ICD-10-CM | POA: Diagnosis present

## 2016-07-04 HISTORY — DX: Psychophysical visual disturbances: H53.16

## 2016-07-04 HISTORY — DX: Stress incontinence (female) (male): N39.3

## 2016-07-04 LAB — COMPREHENSIVE METABOLIC PANEL
ALK PHOS: 187 U/L — AB (ref 38–126)
ALT: 16 U/L — ABNORMAL LOW (ref 17–63)
ANION GAP: 11 (ref 5–15)
AST: 27 U/L (ref 15–41)
Albumin: 2.8 g/dL — ABNORMAL LOW (ref 3.5–5.0)
BUN: 34 mg/dL — ABNORMAL HIGH (ref 6–20)
CALCIUM: 8.8 mg/dL — AB (ref 8.9–10.3)
CO2: 19 mmol/L — AB (ref 22–32)
Chloride: 109 mmol/L (ref 101–111)
Creatinine, Ser: 2.24 mg/dL — ABNORMAL HIGH (ref 0.61–1.24)
GFR, EST AFRICAN AMERICAN: 28 mL/min — AB (ref >60)
GFR, EST NON AFRICAN AMERICAN: 24 mL/min — AB (ref >60)
Glucose, Bld: 103 mg/dL — ABNORMAL HIGH (ref 65–99)
Potassium: 4.2 mmol/L (ref 3.5–5.1)
SODIUM: 139 mmol/L (ref 135–145)
Total Bilirubin: 0.8 mg/dL (ref 0.3–1.2)
Total Protein: 5.4 g/dL — ABNORMAL LOW (ref 6.5–8.1)

## 2016-07-04 LAB — CBC WITH DIFFERENTIAL/PLATELET
Basophils Absolute: 0 10*3/uL (ref 0.0–0.1)
Basophils Relative: 0 %
EOS ABS: 0 10*3/uL (ref 0.0–0.7)
Eosinophils Relative: 0 %
HEMATOCRIT: 40.2 % (ref 39.0–52.0)
HEMOGLOBIN: 13.8 g/dL (ref 13.0–17.0)
LYMPHS ABS: 1.3 10*3/uL (ref 0.7–4.0)
Lymphocytes Relative: 9 %
MCH: 30.5 pg (ref 26.0–34.0)
MCHC: 34.3 g/dL (ref 30.0–36.0)
MCV: 88.9 fL (ref 78.0–100.0)
MONOS PCT: 7 %
Monocytes Absolute: 1 10*3/uL (ref 0.1–1.0)
NEUTROS ABS: 11.7 10*3/uL — AB (ref 1.7–7.7)
NEUTROS PCT: 84 %
Platelets: 123 10*3/uL — ABNORMAL LOW (ref 150–400)
RBC: 4.52 MIL/uL (ref 4.22–5.81)
RDW: 14.3 % (ref 11.5–15.5)
WBC: 14.1 10*3/uL — ABNORMAL HIGH (ref 4.0–10.5)

## 2016-07-04 LAB — PROTIME-INR
INR: 1.48
PROTHROMBIN TIME: 18.1 s — AB (ref 11.4–15.2)

## 2016-07-04 LAB — ECHOCARDIOGRAM COMPLETE
Height: 70 in
WEIGHTICAEL: 2883.2 [oz_av]

## 2016-07-04 LAB — URINE CULTURE: Culture: <10000 — AB

## 2016-07-04 LAB — TSH: TSH: 1.965 u[IU]/mL (ref 0.350–4.500)

## 2016-07-04 LAB — HEPARIN LEVEL (UNFRACTIONATED)
HEPARIN UNFRACTIONATED: 0.84 [IU]/mL — AB (ref 0.30–0.70)
HEPARIN UNFRACTIONATED: 0.93 [IU]/mL — AB (ref 0.30–0.70)

## 2016-07-04 MED ORDER — SODIUM CHLORIDE 0.9 % IR SOLN
3000.0000 mL | Status: DC
  Administered 2016-07-04 – 2016-07-06 (×21): 3000 mL

## 2016-07-04 MED ORDER — CYANOCOBALAMIN 1000 MCG/ML IJ SOLN
1000.0000 ug | Freq: Every day | INTRAMUSCULAR | Status: DC
  Administered 2016-07-04 – 2016-07-10 (×7): 1000 ug via INTRAMUSCULAR
  Filled 2016-07-04 (×6): qty 1

## 2016-07-04 MED ORDER — OLANZAPINE 2.5 MG PO TABS
2.5000 mg | ORAL_TABLET | Freq: Every day | ORAL | Status: DC
  Administered 2016-07-04: 2.5 mg via ORAL
  Filled 2016-07-04: qty 1

## 2016-07-04 MED ORDER — HEPARIN (PORCINE) IN NACL 100-0.45 UNIT/ML-% IJ SOLN
800.0000 [IU]/h | INTRAMUSCULAR | Status: DC
  Administered 2016-07-05 – 2016-07-06 (×2): 800 [IU]/h via INTRAVENOUS
  Filled 2016-07-04 (×3): qty 250

## 2016-07-04 MED ORDER — ORAL CARE MOUTH RINSE
15.0000 mL | Freq: Two times a day (BID) | OROMUCOSAL | Status: DC
  Administered 2016-07-07 – 2016-07-08 (×4): 15 mL via OROMUCOSAL

## 2016-07-04 NOTE — Consult Note (Signed)
New Consult Note  Requesting Physician: Eugenie Filler, MD  Service Requesting Consult: Medicine  Urology Consult Attending: McKenzie Reason for Consult:  Gross hematuria, AUS in place  Subjective: Luis Pierce is seen in consultation for reasons noted above. He is a 83 year old male with history of many medical problems, urology problems notably prostate cancer s/p prostatectomy with resulting stress urinary incontinence. No radiation therapy. He had an AUS placed and replaced by Dr. Matilde Sprang, most recently in September 2013. There is report from the daughter of gross hematuria approximately 6 months ago. This was evaluated with CT and cystoscopy. Reportedly there was a small lesion in the bladder but it was ultimately decided to not proceed with biopsy/resection of this. Gross hematuria started on arrival to the ED. No fevers at home. Patient has been cycling device per the daughter. The patient is not very interactive in history taking. The majority of his history was taken from his daughter.   He presented with generalized weakness, back pain, gait instability. He did fall, but had no significant trauma per his daughter. He was found to have an acute PE and has been heparinized. Gross hematuria started prior to heparinization. Urology was called to evaluate for gross hematuria with passage of clots and placement of foley catheter. He is started on Rocephin. His urine culture has returned <10k. Blood cultures NGTD. Admission creatinine was 1.06. Hemoglobin 14.4.    Past Medical History: Past Medical History:  Diagnosis Date  . Blind left eye    retinal detached retina  . 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 Chronic kidney disease    hx kidney stones and urinary incontinence  . Colon cancer ([place])   . Coronary artery disease   . Diverticulosis   . DJD (degenerative joint disease)   . Glaucoma   . Hyperlipemia   . Hypertension   . Lipoma of back   . Melanoma of back ([place])     . Mild mitral regurgitation   . Prostate cancer ([place])   . Retinal detachment   . Urinary incontinence   . Urinary incontinence, male, stress 07/04/2016    Past Surgical History:  Past Surgical History:  Procedure Laterality Date  . APPENDECTOMY    . CARDIAC CATHETERIZATION    . CHOLECYSTECTOMY    . CORONARY ARTERY BYPASS GRAFT  1982   1 vessel  . CYSTOSCOPY  09/21/2011   Procedure: CYSTOSCOPY;  Surgeon: Reece Packer, MD;  Location: [facility];  Service: Urology;  Laterality: N/A;  . EYE SURGERY     detached retinas bilateral,bil.cataract  . TONSILLECTOMY     as child  . URINARY SPHINCTER IMPLANT  09/21/2011   Procedure: ARTIFICIAL URINARY SPHINCTER;  Surgeon: Reece Packer, MD;  Location: [facility];  Service: Urology;  Laterality: N/A;    Medication: Current Facility-Administered Medications  Medication Dose Route Frequency Provider Last Rate Last Dose  . acetaminophen (TYLENOL) tablet 650 mg  650 mg Oral Q6H PRN Rondel Jumbo, PA-C   650 mg at 07/04/16 6195   Or  . acetaminophen (TYLENOL) suppository 650 mg  650 mg Rectal Q6H PRN Rondel Jumbo, PA-C      . atorvastatin (LIPITOR) tablet 40 mg  40 mg Oral q1800 Rondel Jumbo, PA-C   40 mg at 07/03/16 1829  . bisacodyl (DULCOLAX) suppository 10 mg  10 mg Rectal Daily PRN Sharene Butters E, PA-C      . cefTRIAXone (ROCEPHIN) 1 g in dextrose 5 % 50 mL  IVPB  1 g Intravenous Q24H Bajbus, Almeta Monas, RPH   Stopped at 07/03/16 2038  . dorzolamide-timolol (COSOPT) 22.3-6.8 MG/ML ophthalmic solution 1 drop  1 drop Right Eye BID Rondel Jumbo, PA-C   1 drop at 07/04/16 0925  . fentaNYL (SUBLIMAZE) injection 50 mcg  50 mcg Intravenous Once Julianne Rice, MD      . heparin ADULT infusion 100 units/mL (25000 units/262mL sodium chloride 0.45%)  1,200 Units/hr Intravenous Continuous Wynell Balloon, RPH 12 mL/hr at 07/03/16 1721 1,200 Units/hr at 07/03/16 1721  . HYDROcodone-acetaminophen (NORCO/VICODIN) 5-325 MG per tablet 1-2 tablet   1-2 tablet Oral Q4H PRN Rondel Jumbo, PA-C      . latanoprost (XALATAN) 0.005 % ophthalmic solution 1 drop  1 drop Right Eye QHS Rondel Jumbo, PA-C   1 drop at 07/03/16 2145  . MEDLINE mouth rinse  15 mL Mouth Rinse q12n4p Buriev, Arie Sabina, MD      . metoprolol succinate (TOPROL-XL) 24 hr tablet 50 mg  50 mg Oral QPC breakfast Rondel Jumbo, PA-C   50 mg at 07/04/16 8850  . ondansetron (ZOFRAN) tablet 4 mg  4 mg Oral Q6H PRN Rondel Jumbo, PA-C       Or  . ondansetron [facility]) injection 4 mg  4 mg Intravenous Q6H PRN Shawn Route, Sara E, PA-C      . senna-docusate (Senokot-S) tablet 1 tablet  1 tablet Oral QHS PRN Rondel Jumbo, PA-C        Allergies: No Known Allergies  Social History: Social History  Substance Use Topics  . Smoking status: Never Smoker  . Smokeless tobacco: Never Used  . Alcohol use 0.6 oz/week    1 Glasses of wine per week     Comment: rarely    Family History History reviewed. No pertinent family history.  Review of Systems 10 systems were reviewed and are negative except as noted specifically in the HPI.  Objective: Vital signs in last 24 hours: BP (!) 126/57 (BP Location: Right Arm)   Pulse 88   Temp 98.3 F (36.8 C) (Axillary)   Resp 19   Ht 5\' 10"  (1.778 m)   Wt 81.7 kg (180 lb 3.2 oz)   SpO2 96%   BMI 25.86 kg/m   Intake/Output last 3 shifts: I/O last 3 completed shifts: In: 360 [P.O.:360] Out: 225 [Urine:225]  Physical Exam General: NAD, resting  HEENT: [place]/AT Pulmonary: Normal work of breathing on RA Cardiovascular: Regular rate, adequate peripheral perfusion Abdomen: soft, NTTP, nondistended GU: Palpable AUS pump in left hemiscrotum Extremities: warm and well perfused Neuro: Interactive, hard of hearing and sight  Most Recent Labs: Lab Results  Component Value Date   WBC 12.6 (H) 07/03/2016   HGB 14.4 07/03/2016   HCT 42.8 07/03/2016   PLT 130 (L) 07/03/2016    Lab Results  Component Value Date   NA 138  07/03/2016   K 3.9 07/03/2016   CL 109 07/03/2016   CO2 22 07/03/2016   BUN 24 (H) 07/03/2016   CREATININE 1.06 07/03/2016   CALCIUM 8.9 07/03/2016    Lab Results  Component Value Date   ALKPHOS 191 (H) 07/03/2016   BILITOT 1.4 (H) 07/03/2016   BILIDIR 0.3 07/03/2016   PROT 5.9 (L) 07/03/2016   ALBUMIN 3.1 (L) 07/03/2016   ALT 17 07/03/2016   AST 32 07/03/2016    Lab Results  Component Value Date   INR 1.10 09/13/2011   APTT 32 09/13/2011  IMAGING: Ct Head Wo Contrast  Result Date: 07/03/2016 CLINICAL DATA:  States during this episode today he felt like his speech was slurred and his bilateral legs could not move Pt states that when woke up this morning his bilateral legs could not move and he felt his speech was slurred. EXAM: CT HEAD WITHOUT CONTRAST TECHNIQUE: Contiguous axial images were obtained from the base of the skull through the vertex without intravenous contrast. COMPARISON:  None. FINDINGS: Brain: There is moderate central and cortical atrophy. Periventricular white matter changes are consistent with small vessel disease. There is no intra or extra-axial fluid collection or mass lesion. The basilar cisterns and ventricles have a normal appearance. There is no CT evidence for acute infarction or hemorrhage. Vascular: There is atherosclerotic calcification of the carotid siphons. Skull: Normal. Negative for fracture or focal lesion. Sinuses/Orbits: No acute finding.  Bilateral scleral banding. Other: None IMPRESSION: 1.  No evidence for acute  abnormality. 2. Atrophy and small vessel disease. Electronically Signed   By: Nolon Nations M.D.   On: 07/03/2016 10:07   Ct Lumbar Spine Wo Contrast  Result Date: 07/03/2016 CLINICAL DATA:  Lower extremity weakness. EXAM: CT LUMBAR SPINE WITHOUT CONTRAST TECHNIQUE: Multidetector CT imaging of the lumbar spine was performed without intravenous contrast administration. Multiplanar CT image reconstructions were also generated.  COMPARISON:  Abdominal CT 01/26/2016.  Lumbar spine MRI 01/06/2007. FINDINGS: Segmentation: Standard Alignment: Grade 1 anterolisthesis at L4-5, facet mediated. Vertebrae: Remote L4 and S1 superior endplate fracture. No acute fracture or signs of discitis. There is patchy sclerosis in the S1 body and bilateral upper ilium. Upper right ilium sclerosis correlates with chronic signal abnormality based on 2008 MRI, other areas were not definitively seen previously. No change from CT 02/15/2016. Paraspinal and other soft tissues: Ectatic celiac axis, likely from proximal stenosis. Bilateral renal sinus cysts. Colonic diverticulosis. Postoperative bowel in the right upper quadrant. Disc levels: T12- L1: Spondylosis.  No evidence of impingement L1-L2: Spondylosis.  No evidence of impingement L2-L3: Spondylosis. Mild ligamentum flavum thickening and calcification. Subarticular recess narrowing and overall mild spinal stenosis. No evidence of foraminal impingement L3-L4: Bulging of the disc with calcification. Ligamentum flavum thickening and calcification. Degenerative facet hypertrophy. Bilateral Subarticular recess stenosis. Inferior foraminal narrowing without suspected L3 compression. L4-L5: Advanced facet arthropathy with anterolisthesis. Prominent ligamentum flavum thickening with calcification. The disc is bulging. Spinal stenosis is advanced. Disc bulging and facet spurring causes advanced right foraminal stenosis. L5-S1:Degenerative facet spurring with periarticular calcification. Negative disc. No impingement. IMPRESSION: 1. L4-5 advanced spinal and right foraminal stenosis due to disc bulging and posterior element hypertrophy. 2. L2-3 and L3-4 degenerative bilateral subarticular recess narrowing. 3. Indeterminate sclerotic foci in the bilateral ilium and S1 body, stable from January 2018. Bone scan could further evaluate in this patient with history of prostate cancer. Electronically Signed   By: Monte Fantasia  M.D.   On: 07/03/2016 17:38   Ct T-spine No Charge  Result Date: 07/03/2016 CLINICAL DATA:  Back pain for 2 days. EXAM: CT THORACIC SPINE WITHOUT CONTRAST TECHNIQUE: Multidetector CT images of the thoracic were obtained using the standard protocol without intravenous contrast. COMPARISON:  None. FINDINGS: The thoracic vertebral bodies are normally aligned. There are advanced degenerative changes with anterior bridging osteophytes and probable changes of DISH. No acute fracture. No bone lesion. No spinal canal compromise. As noted on today's chest CT there are right-sided pulmonary emboli. Bibasilar atelectasis. IMPRESSION: Advanced degenerative changes involving the thoracic spine but no fracture or bone  lesion. Right-sided pulmonary emboli and bibasilar atelectasis. Electronically Signed   By: Marijo Sanes M.D.   On: 07/03/2016 14:52   Dg Chest Port 1 View  Result Date: 07/03/2016 CLINICAL DATA:  Chest pain EXAM: PORTABLE CHEST 1 VIEW COMPARISON:  09/13/2011 FINDINGS: Previous median sternotomy CABG procedure. No pleural effusion or edema. No airspace opacities. IMPRESSION: 1. No acute cardiopulmonary abnormalities. Electronically Signed   By: Kerby Moors M.D.   On: 07/03/2016 09:00   Ct Angio Chest Aorta W And/or Wo Contrast  Result Date: 07/03/2016 CLINICAL DATA:  Back and chest pain. EXAM: CT ANGIOGRAPHY CHEST WITH CONTRAST TECHNIQUE: Multidetector CT imaging of the chest was performed using the standard protocol during bolus administration of intravenous contrast. Multiplanar CT image reconstructions and MIPs were obtained to evaluate the vascular anatomy. CONTRAST:  100 cc Isovue 370 COMPARISON:  None. FINDINGS: Cardiovascular: The heart is upper limits of normal in size for age. No pericardial effusion. There is tortuosity, ectasia and mild atherosclerotic calcifications involving the thoracic aorta. No focal aneurysm or dissection. Mild fusiform enlargement of the ascending aorta measuring a  maximum of 39.5 mm on image number 57. The pulmonary arterial tree is fairly well opacified. There is moderate right-sided filling defects consistent with pulmonary embolism. No findings to suggest right heart strain. The RV LV ratio is normal. Coronary artery calcifications and surgical changes from bypass surgery. Mediastinum/Nodes: Mediastinal and hilar lymph nodes are noted. Right hilar node on image number 50 measures 25.5 x 15 mm. The esophagus is grossly. Lungs/Pleura: Moderate patchy bilateral lower lobe atelectasis without definite infiltrate, effusion or edema. No worrisome pulmonary lesions. Upper Abdomen: No significant upper abdominal findings. The abdominal aorta is normal in caliber. No dissection. Scattered atherosclerotic calcifications. Moderate tortuosity distally. Musculoskeletal: No significant bony findings. Advanced degenerative changes involving the thoracic spine but no bone lesion or fracture. Sternal wires related to prior bypass surgery. The Review of the MIP images confirms the above findings. IMPRESSION: 1. Right-sided pulmonary embolism without findings for right heart strain. 2. Tortuosity, ectasia and atherosclerotic calcifications involving the thoracic aorta but no dissection. 3. Three-vessel coronary artery calcifications and surgical changes from coronary artery bypass surgery. 4. Bibasilar atelectasis but no definite infiltrates, edema or effusions. Aortic Atherosclerosis (ICD10-I70.0). Electronically Signed   By: Marijo Sanes M.D.   On: 07/03/2016 14:51     Assessment: Patient is a 83 y.o. male with many medical problems, urology problems notably prostate cancer s/p prostatectomy with resulting stress urinary incontinence and AUS placement by Dr. Matilde Sprang most recently in September 2013. No history of radiation therapy.   He presented with generalized weakness and gait instability. A CT diagnosed an acute PE.  He has been heparinized. He had gross hematuria prior to  heparin. There is question of a known lesion in his bladder that may have caused bleeding about 6 months ago. There is also the potential contribution of cystitis. Urology called for management. No catheter had been placed.   I deactivated his AUS. I then advanced an 18Fr 3-way foley per urethra with mild resistance at the meatus but no resistance in the area of the sphincter itself. There was grossly bloody output. I performed hand irrigation of only minimal amount of clots. He was started on CBI at a medium rate with pink output.   Recommendations: 1. Continue foley catheter per urology.  2. Continue continuous bladder irrigation to keep urine pink color. Irrigate catheter and/or call urology if problems with draining or patient having significant suprapubic  pressure. 3. Ok to continue anticoagulation for PE. We will have more information about the status and severity of his gross hematuria after performing a bit of CBI.  Will likely need outpatient follow-up including cystoscopy. Will follow. Discussed with Dr. Alyson Ingles.   Thank you for this consult. Please do not hesitate to contact us with any further questions/concerns.  Lorayne Bender, MD PGY4 Urology Resident

## 2016-07-04 NOTE — Progress Notes (Signed)
Patient pulled I.V. Out and Tele Off and gown Off. Patient thinks he is at home in his chair, Reoriented patient easily. Patient will not move his legs and can feel me touching them. He does press down on my hands with good strength.  He moves arms well and good grips.

## 2016-07-04 NOTE — Progress Notes (Signed)
ANTICOAGULATION CONSULT NOTE - Follow Up Consult  Pharmacy Consult for Heparin Indication: pulmonary embolus  No Known Allergies  Patient Measurements: Height: 5\' 10"  (177.8 cm) Weight: 180 lb 3.2 oz (81.7 kg) IBW/kg (Calculated) : 73 Heparin Dosing Weight:  81.7 kg  Vital Signs:    Labs:  Recent Labs  07/03/16 0819 07/04/16 0952 07/04/16 0958 07/04/16 1933  HGB 14.4  --  13.8  --   HCT 42.8  --  40.2  --   PLT 130*  --  123*  --   LABPROT  --  18.1*  --   --   INR  --  1.48  --   --   HEPARINUNFRC  --  0.84*  --  0.93*  CREATININE 1.06 2.24*  --   --     Estimated Creatinine Clearance: 22.2 mL/min (A) (by C-G formula based on SCr of 2.24 mg/dL (H)).  Assessment:  Anticoag: IV hep for acute PE - 6/16 PM: Pt pulled out IV line around 2000. IV team getting it put back in around 2200. Hep off for around 2 hrs. - 6/17: HL 0.84 high, CBC and INR pending - PM repeat HL 0.94 continues to trend up despite rate decrease earlier today.   Goal of Therapy:  Heparin level 0.3-0.7 units/ml Monitor platelets by anticoagulation protocol: Yes   Plan:  Hold heparin x 1 hr, then decrease to 800 units/hr Daily HL, CBC Daily HL and CBC   Luis Pierce, PharmD, BCPS Clinical Staff Pharmacist Pager [phone]  Eilene Ghazi Stillinger 07/04/2016,8:10 PM

## 2016-07-04 NOTE — Progress Notes (Signed)
ANTICOAGULATION CONSULT NOTE - Follow Up Consult  Pharmacy Consult for Heparin Indication: pulmonary embolus  No Known Allergies  Patient Measurements: Height: 5\' 10"  (177.8 cm) Weight: 180 lb 3.2 oz (81.7 kg) IBW/kg (Calculated) : 73 Heparin Dosing Weight:  81.7 kg  Vital Signs: Temp: 98.3 F (36.8 C) (06/17 0540) Temp Source: Axillary (06/17 0540) BP: 126/57 (06/17 0540) Pulse Rate: 88 (06/17 0540)  Labs:  Recent Labs  07/03/16 0819 07/04/16 0952  HGB 14.4  --   HCT 42.8  --   PLT 130*  --   HEPARINUNFRC  --  0.84*  CREATININE 1.06  --     Estimated Creatinine Clearance: 46.9 mL/min (by C-G formula based on SCr of 1.06 mg/dL).  Assessment:  Anticoag: IV hep for acute PE - 6/16 PM: Pt pulled out IV line around 2000. IV team getting it put back in around 2200. Hep off for around 2 hrs. - 6/17: HL 0.84 high, CBC and INR pending   Goal of Therapy:  Heparin level 0.3-0.7 units/ml Monitor platelets by anticoagulation protocol: Yes   Plan:  Decrease IV heparin to 1000 units/hr Recheck HL in 8 hr. Daily HL and CBC   Jami Bogdanski S. Alford Highland, PharmD, BCPS Clinical Staff Pharmacist Pager [phone]  Eilene Ghazi Stillinger 07/04/2016,11:46 AM

## 2016-07-04 NOTE — Progress Notes (Signed)
Awake unwrapping Kerlix from IV. Instructed pt to stop as he is going to pull his IV line out. Became resistant and explained and reinforced where he was and the need for the medicine. Stated "I am trying to get my watch". Daughter at bedside awoke and instructed to help hold his R arm as he was fighting and attempting to hit. NT Gwyndolyn Saxon also came and assisted. Flushed line and no leaking noted, as some moisture seemed to be felt. Watch removed and given to pt. After being aware of the time, he stated "I thought I told you not to wake me up anymore this morning". Again reinforced Md orders, and he stated "I dont care what the doctor says".

## 2016-07-04 NOTE — Progress Notes (Signed)
*  PRELIMINARY RESULTS* Vascular Ultrasound Lower extremity venous duplex has been completed.  Preliminary findings: DVT noted in the right peroneal veins. No DVT LLE.   Gave results to [facility], RN  Landry Mellow, RDMS, RVT  07/04/2016, 2:55 PM

## 2016-07-04 NOTE — Progress Notes (Signed)
PROGRESS NOTE    Luis Pierce  FUX:[id number] DOB: 1922-06-09 DOA: 07/03/2016 PCP: Lajean Manes, MD    Brief Narrative:  Patient is a 83 year old gentleman presented to the ED with generalized weakness to the point where patient slid to the floor and complaints of back pain. Patient also noted to have gross hematuria as well as right-sided pulmonary emboli. Patient on IV heparin. Neurology and urology consulted.   Assessment & Plan:   Principal Problem:   Bilateral leg weakness Active Problems:   Pulmonary embolism (HCC)   Gross hematuria   Generalized weakness   Hypertension   Hyperlipidemia   Hypothermia   Dehydration   [name]   Urinary incontinence, male, stress   Pulmonary embolism on right [facility])   Thoracic back pain   Acute lower UTI  #1 bilateral leg weakness/upper back pain Questionable etiology. Patient had presented with lower extremity weakness that he slid to the floor with complaints of back pain. CT T-spine with advanced degenerative changes but no compression fractures noted. CT L-spine with no significant acute abnormalities explaining patient's symptoms. Patient has been seen in consultation by neurology while recommended MRI of the brain and T-spine if able to be obtained following identification of metallic right globe instrumentation seen on CT head as ferromagnetic versus nonferromagnetic. Patient's ophthalmologist a been trying to be reached however unsuccessful at this time. PT/OT. Neurology following and appreciate input and recommendations.  #2 acute PE Questionable etiology. 2-D echo pending to rule out right ventricular strain. Lower extremity Dopplers pending. Patient currently on IV heparin. Patient with gross hematuria and awaiting urology evaluation. Will need to know urology input in terms of anticoagulation purposes.  #3 gross hematuria Patient with a history of stress urinary incontinence status post artificial urinary  sphincter placement. ?? Etiology. Urinalysis was turbid with many bacteria to numerous to count WBCs, too numerous to count RBCs. Repeat UA with cultures and sensitivities pending. Patient with history of prostate cancer status post prostatectomy per family. Patient also with a history of colon cancer. Foley catheter unable to be placed secondary to artificial urinary sphincter. Likely needs CT abdomen and pelvis stone protocol however will defer order until patient has been assessed by urology. Urology consulted for further evaluation and management.  #4 dehydration IV fluids.  #5 acute blood loss anemia Likely secondary to gross hematuria. Check H&H. Follow.  #6 [name] Patient noted to have bouts of confusion overnight. Will place on low-dose Zyprexa daily at bedtime.  #7 hypertension Continue metoprolol  #8 probable UTI Urine cultures pending. Continue IV Rocephin.  #9 hyperlipidemia Continue statin.   DVT prophylaxis: On IV heparin Code Status: Full Family Communication: Updated patient and family at bedside. Disposition Plan: Hopefully back to home environment once gross hematuria has resolved, patient treated for his PE, lower extremity weakness and back pain workup completed and per neurology, urology.   Consultants:   Neurology: Dr.Lindzen 07/03/2016  Urology pending  Procedures:   CT L-spine 07/03/2016   CT angiogram chest 07/03/2016  CT head 07/03/2016  Chest x-ray 07/03/2016  CT C-spine 07/03/2016  Antimicrobials:   IV Rocephin 07/03/2016   Subjective: Patient noted to have some confusion overnight. Patient denies any shortness of breath. No chest pain. Patient complaining of upper back pain. Patient noted with significant gross hematuria this morning saturating his underwear and bed linen.  Objective: Vitals:   07/03/16 1900 07/04/16 0036 07/04/16 0540 07/04/16 0659  BP: (!) 123/102 (!) 142/65 (!) 126/57  Pulse: 96 81 88   Resp:  20 (!) 21 19   Temp: 98.5 F (36.9 C) 98.5 F (36.9 C) 98.3 F (36.8 C)   TempSrc: Oral Oral Axillary   SpO2: 95% 96% 96%   Weight:    81.7 kg (180 lb 3.2 oz)  Height:        Intake/Output Summary (Last 24 hours) at 07/04/16 1048 Last data filed at 07/04/16 0730  Gross per 24 hour  Intake              600 ml  Output              225 ml  Net              375 ml   Filed Weights   07/03/16 0725 07/03/16 1750 07/04/16 0659  Weight: 81.6 kg (180 lb) 79.7 kg (175 lb 11.2 oz) 81.7 kg (180 lb 3.2 oz)    Examination:  General exam: Appears calm and comfortable.Dry mucous membranes Respiratory system: Clear to auscultation anterior lung fields. Respiratory effort normal. Cardiovascular system: S1 & S2 heard, RRR. No JVD, murmurs, rubs, gallops or clicks. No pedal edema. Gastrointestinal system: Abdomen is nondistended, soft and nontender. No organomegaly or masses felt. Normal bowel sounds heard. Central nervous system: Alert and oriented. No focal neurological deficits. Extremities: Symmetric 5 x 5 power. Skin: No rashes, lesions or ulcers Psychiatry: Judgement and insight appear normal. Mood & affect appropriate.  Genitourinary: Gross hematuria    Data Reviewed: I have personally reviewed following labs and imaging studies  CBC:  Recent Labs Lab 07/03/16 0819  WBC 12.6*  NEUTROABS 11.0*  HGB 14.4  HCT 42.8  MCV 88.6  PLT 956*   Basic Metabolic Panel:  Recent Labs Lab 07/03/16 0819  NA 138  K 3.9  CL 109  CO2 22  GLUCOSE 128*  BUN 24*  CREATININE 1.06  CALCIUM 8.9   GFR: Estimated Creatinine Clearance: 46.9 mL/min (by C-G formula based on SCr of 1.06 mg/dL). Liver Function Tests:  Recent Labs Lab 07/03/16 0819 07/03/16 1803  AST 32  --   ALT 17  --   ALKPHOS 191*  --   BILITOT 1.8* 1.4*  PROT 5.9*  --   ALBUMIN 3.1*  --    No results for input(s): LIPASE, AMYLASE in the last 168 hours. No results for input(s): AMMONIA in the last 168  hours. Coagulation Profile: No results for input(s): INR, PROTIME in the last 168 hours. Cardiac Enzymes: No results for input(s): CKTOTAL, CKMB, CKMBINDEX, TROPONINI in the last 168 hours. BNP (last 3 results) No results for input(s): PROBNP in the last 8760 hours. HbA1C: No results for input(s): HGBA1C in the last 72 hours. CBG:  Recent Labs Lab 07/03/16 1516  GLUCAP 118*   Lipid Profile: No results for input(s): CHOL, HDL, LDLCALC, TRIG, CHOLHDL, LDLDIRECT in the last 72 hours. Thyroid Function Tests: No results for input(s): TSH, T4TOTAL, FREET4, T3FREE, THYROIDAB in the last 72 hours. Anemia Panel: No results for input(s): VITAMINB12, FOLATE, FERRITIN, TIBC, IRON, RETICCTPCT in the last 72 hours. Sepsis Labs: No results for input(s): PROCALCITON, LATICACIDVEN in the last 168 hours.  Recent Results (from the past 240 hour(s))  Culture, blood (Routine X 2) w Reflex to ID Panel     Status: None (Preliminary result)   Collection Time: 07/03/16  6:08 PM  Result Value Ref Range Status   Specimen Description BLOOD LEFT HAND  Final   Special Requests IN PEDIATRIC  BOTTLE Blood Culture adequate volume  Final   Culture NO GROWTH < 24 HOURS  Final   Report Status PENDING  Incomplete  Culture, blood (Routine X 2) w Reflex to ID Panel     Status: None (Preliminary result)   Collection Time: 07/03/16  6:13 PM  Result Value Ref Range Status   Specimen Description BLOOD RIGHT ANTECUBITAL  Final   Special Requests IN PEDIATRIC BOTTLE Blood Culture adequate volume  Final   Culture NO GROWTH < 24 HOURS  Final   Report Status PENDING  Incomplete         Radiology Studies: Ct Head Wo Contrast  Result Date: 07/03/2016 CLINICAL DATA:  States during this episode today he felt like his speech was slurred and his bilateral legs could not move Pt states that when woke up this morning his bilateral legs could not move and he felt his speech was slurred. EXAM: CT HEAD WITHOUT CONTRAST  TECHNIQUE: Contiguous axial images were obtained from the base of the skull through the vertex without intravenous contrast. COMPARISON:  None. FINDINGS: Brain: There is moderate central and cortical atrophy. Periventricular white matter changes are consistent with small vessel disease. There is no intra or extra-axial fluid collection or mass lesion. The basilar cisterns and ventricles have a normal appearance. There is no CT evidence for acute infarction or hemorrhage. Vascular: There is atherosclerotic calcification of the carotid siphons. Skull: Normal. Negative for fracture or focal lesion. Sinuses/Orbits: No acute finding.  Bilateral scleral banding. Other: None IMPRESSION: 1.  No evidence for acute  abnormality. 2. Atrophy and small vessel disease. Electronically Signed   By: Nolon Nations M.D.   On: 07/03/2016 10:07   Ct Lumbar Spine Wo Contrast  Result Date: 07/03/2016 CLINICAL DATA:  Lower extremity weakness. EXAM: CT LUMBAR SPINE WITHOUT CONTRAST TECHNIQUE: Multidetector CT imaging of the lumbar spine was performed without intravenous contrast administration. Multiplanar CT image reconstructions were also generated. COMPARISON:  Abdominal CT 01/26/2016.  Lumbar spine MRI 01/06/2007. FINDINGS: Segmentation: Standard Alignment: Grade 1 anterolisthesis at L4-5, facet mediated. Vertebrae: Remote L4 and S1 superior endplate fracture. No acute fracture or signs of discitis. There is patchy sclerosis in the S1 body and bilateral upper ilium. Upper right ilium sclerosis correlates with chronic signal abnormality based on 2008 MRI, other areas were not definitively seen previously. No change from CT 02/15/2016. Paraspinal and other soft tissues: Ectatic celiac axis, likely from proximal stenosis. Bilateral renal sinus cysts. Colonic diverticulosis. Postoperative bowel in the right upper quadrant. Disc levels: T12- L1: Spondylosis.  No evidence of impingement L1-L2: Spondylosis.  No evidence of impingement  L2-L3: Spondylosis. Mild ligamentum flavum thickening and calcification. Subarticular recess narrowing and overall mild spinal stenosis. No evidence of foraminal impingement L3-L4: Bulging of the disc with calcification. Ligamentum flavum thickening and calcification. Degenerative facet hypertrophy. Bilateral Subarticular recess stenosis. Inferior foraminal narrowing without suspected L3 compression. L4-L5: Advanced facet arthropathy with anterolisthesis. Prominent ligamentum flavum thickening with calcification. The disc is bulging. Spinal stenosis is advanced. Disc bulging and facet spurring causes advanced right foraminal stenosis. L5-S1:Degenerative facet spurring with periarticular calcification. Negative disc. No impingement. IMPRESSION: 1. L4-5 advanced spinal and right foraminal stenosis due to disc bulging and posterior element hypertrophy. 2. L2-3 and L3-4 degenerative bilateral subarticular recess narrowing. 3. Indeterminate sclerotic foci in the bilateral ilium and S1 body, stable from January 2018. Bone scan could further evaluate in this patient with history of prostate cancer. Electronically Signed   By: Neva Seat.D.  On: 07/03/2016 17:38   Ct T-spine No Charge  Result Date: 07/03/2016 CLINICAL DATA:  Back pain for 2 days. EXAM: CT THORACIC SPINE WITHOUT CONTRAST TECHNIQUE: Multidetector CT images of the thoracic were obtained using the standard protocol without intravenous contrast. COMPARISON:  None. FINDINGS: The thoracic vertebral bodies are normally aligned. There are advanced degenerative changes with anterior bridging osteophytes and probable changes of DISH. No acute fracture. No bone lesion. No spinal canal compromise. As noted on today's chest CT there are right-sided pulmonary emboli. Bibasilar atelectasis. IMPRESSION: Advanced degenerative changes involving the thoracic spine but no fracture or bone lesion. Right-sided pulmonary emboli and bibasilar atelectasis. Electronically  Signed   By: Marijo Sanes M.D.   On: 07/03/2016 14:52   Dg Chest Port 1 View  Result Date: 07/03/2016 CLINICAL DATA:  Chest pain EXAM: PORTABLE CHEST 1 VIEW COMPARISON:  09/13/2011 FINDINGS: Previous median sternotomy CABG procedure. No pleural effusion or edema. No airspace opacities. IMPRESSION: 1. No acute cardiopulmonary abnormalities. Electronically Signed   By: Kerby Moors M.D.   On: 07/03/2016 09:00   Ct Angio Chest Aorta W And/or Wo Contrast  Result Date: 07/03/2016 CLINICAL DATA:  Back and chest pain. EXAM: CT ANGIOGRAPHY CHEST WITH CONTRAST TECHNIQUE: Multidetector CT imaging of the chest was performed using the standard protocol during bolus administration of intravenous contrast. Multiplanar CT image reconstructions and MIPs were obtained to evaluate the vascular anatomy. CONTRAST:  100 cc Isovue 370 COMPARISON:  None. FINDINGS: Cardiovascular: The heart is upper limits of normal in size for age. No pericardial effusion. There is tortuosity, ectasia and mild atherosclerotic calcifications involving the thoracic aorta. No focal aneurysm or dissection. Mild fusiform enlargement of the ascending aorta measuring a maximum of 39.5 mm on image number 57. The pulmonary arterial tree is fairly well opacified. There is moderate right-sided filling defects consistent with pulmonary embolism. No findings to suggest right heart strain. The RV LV ratio is normal. Coronary artery calcifications and surgical changes from bypass surgery. Mediastinum/Nodes: Mediastinal and hilar lymph nodes are noted. Right hilar node on image number 50 measures 25.5 x 15 mm. The esophagus is grossly. Lungs/Pleura: Moderate patchy bilateral lower lobe atelectasis without definite infiltrate, effusion or edema. No worrisome pulmonary lesions. Upper Abdomen: No significant upper abdominal findings. The abdominal aorta is normal in caliber. No dissection. Scattered atherosclerotic calcifications. Moderate tortuosity distally.  Musculoskeletal: No significant bony findings. Advanced degenerative changes involving the thoracic spine but no bone lesion or fracture. Sternal wires related to prior bypass surgery. The Review of the MIP images confirms the above findings. IMPRESSION: 1. Right-sided pulmonary embolism without findings for right heart strain. 2. Tortuosity, ectasia and atherosclerotic calcifications involving the thoracic aorta but no dissection. 3. Three-vessel coronary artery calcifications and surgical changes from coronary artery bypass surgery. 4. Bibasilar atelectasis but no definite infiltrates, edema or effusions. Aortic Atherosclerosis (ICD10-I70.0). Electronically Signed   By: Marijo Sanes M.D.   On: 07/03/2016 14:51        Scheduled Meds: . atorvastatin  40 mg Oral q1800  . dorzolamide-timolol  1 drop Right Eye BID  . fentaNYL (SUBLIMAZE) injection  50 mcg Intravenous Once  . latanoprost  1 drop Right Eye QHS  . mouth rinse  15 mL Mouth Rinse q12n4p  . metoprolol succinate  50 mg Oral QPC breakfast   Continuous Infusions: . cefTRIAXone (ROCEPHIN)  IV Stopped (07/03/16 2038)  . heparin 1,200 Units/hr (07/04/16 1041)     LOS: 1 day    Time spent:  [place], MD [facility] Pager [phone]  If 7PM-7AM, please contact night-coverage [email or web address] Password [facility] 07/04/2016, 10:48 AM

## 2016-07-04 NOTE — Progress Notes (Signed)
Repeat neuro assessment and patient is alert and oriented. Family present and patient still says he cannot move legs still feels them and pushes down on my hands with good strength. Daughter that is Doctor here. States he can move legs but is not wanting to do so at this time. patient moves arms and hands well. And follows commands

## 2016-07-04 NOTE — Progress Notes (Signed)
  Echocardiogram 2D Echocardiogram has been performed.  Luis Pierce 07/04/2016, 4:14 PM

## 2016-07-05 ENCOUNTER — Inpatient Hospital Stay (HOSPITAL_COMMUNITY): Payer: Medicare Other

## 2016-07-05 ENCOUNTER — Encounter (HOSPITAL_COMMUNITY): Payer: Self-pay | Admitting: *Deleted

## 2016-07-05 DIAGNOSIS — R29898 Other symptoms and signs involving the musculoskeletal system: Secondary | ICD-10-CM

## 2016-07-05 DIAGNOSIS — N179 Acute kidney failure, unspecified: Secondary | ICD-10-CM | POA: Clinically undetermined

## 2016-07-05 DIAGNOSIS — T68XXXD Hypothermia, subsequent encounter: Secondary | ICD-10-CM

## 2016-07-05 LAB — CBC
HEMATOCRIT: 34.5 % — AB (ref 39.0–52.0)
HEMOGLOBIN: 11.5 g/dL — AB (ref 13.0–17.0)
MCH: 30.3 pg (ref 26.0–34.0)
MCHC: 33.3 g/dL (ref 30.0–36.0)
MCV: 90.8 fL (ref 78.0–100.0)
Platelets: 159 10*3/uL (ref 150–400)
RBC: 3.8 MIL/uL — ABNORMAL LOW (ref 4.22–5.81)
RDW: 14.7 % (ref 11.5–15.5)
WBC: 15.4 10*3/uL — AB (ref 4.0–10.5)

## 2016-07-05 LAB — BASIC METABOLIC PANEL
ANION GAP: 10 (ref 5–15)
BUN: 48 mg/dL — ABNORMAL HIGH (ref 6–20)
CHLORIDE: 107 mmol/L (ref 101–111)
CO2: 18 mmol/L — ABNORMAL LOW (ref 22–32)
Calcium: 8.4 mg/dL — ABNORMAL LOW (ref 8.9–10.3)
Creatinine, Ser: 2.38 mg/dL — ABNORMAL HIGH (ref 0.61–1.24)
GFR calc non Af Amer: 22 mL/min — ABNORMAL LOW (ref >60)
GFR, EST AFRICAN AMERICAN: 26 mL/min — AB (ref >60)
Glucose, Bld: 121 mg/dL — ABNORMAL HIGH (ref 65–99)
POTASSIUM: 4.3 mmol/L (ref 3.5–5.1)
SODIUM: 135 mmol/L (ref 135–145)

## 2016-07-05 LAB — FOLATE RBC
FOLATE, HEMOLYSATE: 391.5 ng/mL
Folate, RBC: 971 ng/mL (ref >498)
Hematocrit: 40.3 % (ref 37.5–51.0)

## 2016-07-05 LAB — VITAMIN B12: Vitamin B-12: <50 pg/mL — ABNORMAL LOW (ref 180–914)

## 2016-07-05 LAB — HEPARIN LEVEL (UNFRACTIONATED)
HEPARIN UNFRACTIONATED: 0.64 [IU]/mL (ref 0.30–0.70)
Heparin Unfractionated: 0.66 IU/mL (ref 0.30–0.70)

## 2016-07-05 MED ORDER — SODIUM CHLORIDE 0.9 % IV SOLN
INTRAVENOUS | Status: AC
  Administered 2016-07-05 (×2): 125 mL/h via INTRAVENOUS
  Administered 2016-07-06: 11:00:00 via INTRAVENOUS

## 2016-07-05 MED ORDER — LORAZEPAM 2 MG/ML IJ SOLN: 0.5000 mg | Freq: Every evening | INTRAMUSCULAR | Status: DC | PRN

## 2016-07-05 NOTE — Progress Notes (Signed)
Subjective: The patient began slurring his speech following Zyprexa yesterday evening  Exam: Vitals:   07/04/16 2039 07/05/16 0420  BP: (!) 112/57 (!) 90/56  Pulse: 66 82  Resp: 18 18  Temp: 97.4 F (36.3 C) 97.5 F (36.4 C)   Gen: In bed, NAD Resp: non-labored breathing, no acute distress Abd: soft, nt  Neuro: MS: Awake, follows some simple commands, not oriented speech is dysarthric and difficult to understand CN: Pupils are postsurgical bilaterally, eyes are disconjugate Motor: He moves bilateral upper extremity as well, no voluntary movement of his lower extremities, he does flex to noxious stimulation Sensory: Flexion to noxious stimulus in the bilateral lower extremities, though he indicates that he cannot feel it despite it being a fairly aggressive noxious stimulus Toes are up going vs withdrawal bilaterally  Impression: 84 year old male with acute lower extremity weakness concerning for cord infarct. He was also found to have severe B12 deficiency. I discussed with his daughter who is contacted his eye specialist and there is ongoing effort to see if his glaucoma device is safe for MRI.  Recommendations: 1) MRI brain and thoracic spine 2) pending this, I will obtain stat head CT given he is anticoagulated with new confusion 3) neurology will continue to follow  Roland Rack, MD [facility] [phone]  If 7pm- 7am, please page neurology on call as listed in [place].

## 2016-07-05 NOTE — Progress Notes (Signed)
PT Cancellation Note  Patient Details Name: Luis Pierce MRN: [id number] DOB: 1922-01-21   Cancelled Treatment:    Reason Eval/Treat Not Completed: Other (comment) (initiated eval however transport arrived for CT/MRI. Will attempt next date)   Jonia Oakey B Lansing Sigmon 07/05/2016, 12:53 PM  Elwyn Reach, [place]

## 2016-07-05 NOTE — Progress Notes (Signed)
PROGRESS NOTE    Luis Pierce  JJH:[id number] DOB: 01-06-1923 DOA: 07/03/2016 PCP: Luis Manes, MD    Brief Narrative:  Patient is a 83 year old gentleman presented to the ED with generalized weakness to the point where patient slid to the floor and complaints of back pain. Patient also noted to have gross hematuria as well as right-sided pulmonary emboli. Patient on IV heparin. Neurology and urology consulted.   Assessment & Plan:   Principal Problem:   Bilateral leg weakness Active Problems:   Pulmonary embolism (HCC)   Gross hematuria   Generalized weakness   Hypertension   Hyperlipidemia   Hypothermia   Dehydration   [name]   Urinary incontinence, male, stress   Pulmonary embolism on right [facility])   Thoracic back pain   Acute lower UTI   B12 deficiency   ARF (acute renal failure) ([place])  #1 bilateral leg weakness/upper back pain Questionable etiology. Patient had presented with lower extremity weakness that he slid to the floor with complaints of back pain. CT T-spine with advanced degenerative changes but no compression fractures noted. CT L-spine with no significant acute abnormalities explaining patient's symptoms. Patient has been seen in consultation by neurology while recommended MRI of the brain and T-spine if able to be obtained following identification of metallic right globe instrumentation seen on CT head as ferromagnetic versus nonferromagnetic. Patient's ophthalmologist has been reached per patient's daughter and is noted that patient's right globe implantation and scleral banding all consistent with MRI scanning. Patient also noted to have significantly low levels of vitamin B-12 of < 50. B-12 level is being replaced. PT/OT. Neurology following and appreciate input and recommendations.  #2 acute PE/R LE DVT Questionable etiology. 2-D echo negative for right ventricular strain. Lower extremity Dopplers right lower extremity DVT in the right  peroneal vein. Patient currently on IV heparin. Patient with gross hematuria and okay per Urology to continue anticoagulation.   #3 gross hematuria Patient with a history of stress urinary incontinence status post artificial urinary sphincter placement. ?? Etiology. Urinalysis was turbid with many bacteria to numerous to count WBCs, too numerous to count RBCs. Repeat UA with cultures and sensitivities pending. Patient with history of prostate cancer status post prostatectomy per family. Patient also with a history of colon cancer. Foley catheter unable to be placed secondary to artificial urinary sphincter. Patient seen in consultation by urology 07/04/2016 and AUS deactivated and Foley catheter placed. Patient started on continuous bladder irrigation to keep urine pink in color per urology. Patient will likely need outpatient cystoscopy. Urology following.   #4 dehydration IV fluids.  #5 acute blood loss anemia Likely secondary to gross hematuria. Follow.  #6 [name] Patient noted to have bouts of confusion the night of admission. Patient started on Zyprexa last night however per family patient with agitation overnight after Zyprexa. Discontinue Zyprexa. Will place on Ativan daily at bedtime when necessary.   #7 hypertension Blood pressure is borderline. Discontinue metoprolol. Place on IV fluids.   #8 probable UTI Urine cultures with less than 10,000 colonies with insignificant growth. Continue IV Rocephin as blood cultures still pending.   #9 hyperlipidemia Continue statin.  #10 vitamin B 12 deficiency Vitamin B 12 1000 MCG's IM daily 1 week, and then weekly times one month and then monthly.   DVT prophylaxis: On IV heparin Code Status: Full Family Communication: Updated patient and family at bedside. Disposition Plan: Hopefully back to home environment once gross hematuria has resolved, patient treated for  his PE, lower extremity weakness and back pain workup  completed and per neurology, urology.   Consultants:   Neurology: Dr.Lindzen 07/03/2016  Urology: Dr Luis Pierce 07/04/2016  Procedures:   CT L-spine 07/03/2016   CT angiogram chest 07/03/2016  CT head 07/03/2016  Chest x-ray 07/03/2016  CT C-spine 07/03/2016  2-D echo 07/04/2016  RLE DVT 07/04/2016  Antimicrobials:   IV Rocephin 07/03/2016   Subjective: Patient per family at some agitation overnight and now patient sleeping most of the morning. Patient did receive some Zyprexa overnight. Patient denies any shortness of breath. No chest pain. Per family after patient received a dose of IM vitamin B12 was able to move his toes voluntarily.   Objective: Vitals:   07/04/16 0540 07/04/16 0659 07/04/16 2039 07/05/16 0420  BP: (!) 126/57  (!) 112/57 (!) 90/56  Pulse: 88  66 82  Resp: 19  18 18   Temp: 98.3 F (36.8 C)  97.4 F (36.3 C) 97.5 F (36.4 C)  TempSrc: Axillary  Axillary Axillary  SpO2: 96%  96% 93%  Weight:  81.7 kg (180 lb 3.2 oz)  80.9 kg (178 lb 6.4 oz)  Height:        Intake/Output Summary (Last 24 hours) at 07/05/16 1043 Last data filed at 07/05/16 0848  Gross per 24 hour  Intake            12290 ml  Output            16825 ml  Net            -4535 ml   Filed Weights   07/03/16 1750 07/04/16 0659 07/05/16 0420  Weight: 79.7 kg (175 lb 11.2 oz) 81.7 kg (180 lb 3.2 oz) 80.9 kg (178 lb 6.4 oz)    Examination:  General exam: Sleeping. Dry mucous membranes. Respiratory system: Clear to auscultation anterior lung fields. Respiratory effort normal. Cardiovascular system: S1 & S2 heard, RRR. No JVD, murmurs, rubs, gallops or clicks. No pedal edema. Gastrointestinal system: Abdomen is nondistended, soft and nontender. No organomegaly or masses felt. Normal bowel sounds heard. Central nervous system: Sleeping however easily arousable. No focal neurological deficits. Extremities: Symmetric 5 x 5 power. Skin: No rashes, lesions or ulcers Psychiatry:  Judgement and insight appear normal. Mood & affect appropriate.  Genitourinary: Gross hematuria    Data Reviewed: I have personally reviewed following labs and imaging studies  CBC:  Recent Labs Lab 07/03/16 0819 07/04/16 0958 07/05/16 0528  WBC 12.6* 14.1* 15.4*  NEUTROABS 11.0* 11.7*  --   HGB 14.4 13.8 11.5*  HCT 42.8 40.2 34.5*  MCV 88.6 88.9 90.8  PLT 130* 123* 628   Basic Metabolic Panel:  Recent Labs Lab 07/03/16 0819 07/04/16 0952 07/05/16 0528  NA 138 139 135  K 3.9 4.2 4.3  CL 109 109 107  CO2 22 19* 18*  GLUCOSE 128* 103* 121*  BUN 24* 34* 48*  CREATININE 1.06 2.24* 2.38*  CALCIUM 8.9 8.8* 8.4*   GFR: Estimated Creatinine Clearance: 20.9 mL/min (A) (by C-G formula based on SCr of 2.38 mg/dL (H)). Liver Function Tests:  Recent Labs Lab 07/03/16 0819 07/03/16 1803 07/04/16 0952  AST 32  --  27  ALT 17  --  16*  ALKPHOS 191*  --  187*  BILITOT 1.8* 1.4* 0.8  PROT 5.9*  --  5.4*  ALBUMIN 3.1*  --  2.8*   No results for input(s): LIPASE, AMYLASE in the last 168 hours. No results for input(s): AMMONIA in  the last 168 hours. Coagulation Profile:  Recent Labs Lab 07/04/16 0952  INR 1.48   Cardiac Enzymes: No results for input(s): CKTOTAL, CKMB, CKMBINDEX, TROPONINI in the last 168 hours. BNP (last 3 results) No results for input(s): PROBNP in the last 8760 hours. HbA1C: No results for input(s): HGBA1C in the last 72 hours. CBG:  Recent Labs Lab 07/03/16 1516  GLUCAP 118*   Lipid Profile: No results for input(s): CHOL, HDL, LDLCALC, TRIG, CHOLHDL, LDLDIRECT in the last 72 hours. Thyroid Function Tests:  Recent Labs  07/04/16 0952  TSH 1.965   Anemia Panel:  Recent Labs  07/04/16 0958  VITAMINB12 45*   Sepsis Labs: No results for input(s): PROCALCITON, LATICACIDVEN in the last 168 hours.  Recent Results (from the past 240 hour(s))  Urine culture     Status: Abnormal   Collection Time: 07/03/16  3:16 PM  Result Value Ref  Range Status   Specimen Description URINE, RANDOM  Final   Special Requests NONE  Final   Culture <10,000 COLONIES/mL INSIGNIFICANT GROWTH (A)  Final   Report Status 07/04/2016 FINAL  Final  Culture, blood (Routine X 2) w Reflex to ID Panel     Status: None (Preliminary result)   Collection Time: 07/03/16  6:08 PM  Result Value Ref Range Status   Specimen Description BLOOD LEFT HAND  Final   Special Requests IN PEDIATRIC BOTTLE Blood Culture adequate volume  Final   Culture NO GROWTH < 24 HOURS  Final   Report Status PENDING  Incomplete  Culture, blood (Routine X 2) w Reflex to ID Panel     Status: None (Preliminary result)   Collection Time: 07/03/16  6:13 PM  Result Value Ref Range Status   Specimen Description BLOOD RIGHT ANTECUBITAL  Final   Special Requests IN PEDIATRIC BOTTLE Blood Culture adequate volume  Final   Culture NO GROWTH < 24 HOURS  Final   Report Status PENDING  Incomplete         Radiology Studies: Ct Lumbar Spine Wo Contrast  Result Date: 07/03/2016 CLINICAL DATA:  Lower extremity weakness. EXAM: CT LUMBAR SPINE WITHOUT CONTRAST TECHNIQUE: Multidetector CT imaging of the lumbar spine was performed without intravenous contrast administration. Multiplanar CT image reconstructions were also generated. COMPARISON:  Abdominal CT 01/26/2016.  Lumbar spine MRI 01/06/2007. FINDINGS: Segmentation: Standard Alignment: Grade 1 anterolisthesis at L4-5, facet mediated. Vertebrae: Remote L4 and S1 superior endplate fracture. No acute fracture or signs of discitis. There is patchy sclerosis in the S1 body and bilateral upper ilium. Upper right ilium sclerosis correlates with chronic signal abnormality based on 2008 MRI, other areas were not definitively seen previously. No change from CT 02/15/2016. Paraspinal and other soft tissues: Ectatic celiac axis, likely from proximal stenosis. Bilateral renal sinus cysts. Colonic diverticulosis. Postoperative bowel in the right upper  quadrant. Disc levels: T12- L1: Spondylosis.  No evidence of impingement L1-L2: Spondylosis.  No evidence of impingement L2-L3: Spondylosis. Mild ligamentum flavum thickening and calcification. Subarticular recess narrowing and overall mild spinal stenosis. No evidence of foraminal impingement L3-L4: Bulging of the disc with calcification. Ligamentum flavum thickening and calcification. Degenerative facet hypertrophy. Bilateral Subarticular recess stenosis. Inferior foraminal narrowing without suspected L3 compression. L4-L5: Advanced facet arthropathy with anterolisthesis. Prominent ligamentum flavum thickening with calcification. The disc is bulging. Spinal stenosis is advanced. Disc bulging and facet spurring causes advanced right foraminal stenosis. L5-S1:Degenerative facet spurring with periarticular calcification. Negative disc. No impingement. IMPRESSION: 1. L4-5 advanced spinal and right foraminal stenosis due  to disc bulging and posterior element hypertrophy. 2. L2-3 and L3-4 degenerative bilateral subarticular recess narrowing. 3. Indeterminate sclerotic foci in the bilateral ilium and S1 body, stable from January 2018. Bone scan could further evaluate in this patient with history of prostate cancer. Electronically Signed   By: Monte Fantasia M.D.   On: 07/03/2016 17:38   US Renal  Result Date: 07/04/2016 CLINICAL DATA:  84 year old male with acute renal failure. EXAM: RENAL / URINARY TRACT ULTRASOUND COMPLETE COMPARISON:  Abdominal CT dated 01/26/2016 FINDINGS: Right Kidney: Length: 10.1 cm. There is mild diffuse parenchymal atrophy and cortical thinning. Mild increased echogenicity. No hydronephrosis or echogenic stone. Left Kidney: Length: 10.8 cm. Mild diffuse parenchymal atrophy and cortical thinning. Mild increased echogenicity. No hydronephrosis or echogenic stone. Bladder: The urinary bladder is decompressed around a Foley catheter. Incidental note of an echogenic liver, likely related to  fatty infiltration. IMPRESSION: 1. Mild renal atrophy, likely age-related. No hydronephrosis or echogenic stone. 2. Fatty liver. Electronically Signed   By: Anner Crete M.D.   On: 07/04/2016 23:36   Ct T-spine No Charge  Result Date: 07/03/2016 CLINICAL DATA:  Back pain for 2 days. EXAM: CT THORACIC SPINE WITHOUT CONTRAST TECHNIQUE: Multidetector CT images of the thoracic were obtained using the standard protocol without intravenous contrast. COMPARISON:  None. FINDINGS: The thoracic vertebral bodies are normally aligned. There are advanced degenerative changes with anterior bridging osteophytes and probable changes of DISH. No acute fracture. No bone lesion. No spinal canal compromise. As noted on today's chest CT there are right-sided pulmonary emboli. Bibasilar atelectasis. IMPRESSION: Advanced degenerative changes involving the thoracic spine but no fracture or bone lesion. Right-sided pulmonary emboli and bibasilar atelectasis. Electronically Signed   By: Marijo Sanes M.D.   On: 07/03/2016 14:52   Ct Angio Chest Aorta W And/or Wo Contrast  Result Date: 07/03/2016 CLINICAL DATA:  Back and chest pain. EXAM: CT ANGIOGRAPHY CHEST WITH CONTRAST TECHNIQUE: Multidetector CT imaging of the chest was performed using the standard protocol during bolus administration of intravenous contrast. Multiplanar CT image reconstructions and MIPs were obtained to evaluate the vascular anatomy. CONTRAST:  100 cc Isovue 370 COMPARISON:  None. FINDINGS: Cardiovascular: The heart is upper limits of normal in size for age. No pericardial effusion. There is tortuosity, ectasia and mild atherosclerotic calcifications involving the thoracic aorta. No focal aneurysm or dissection. Mild fusiform enlargement of the ascending aorta measuring a maximum of 39.5 mm on image number 57. The pulmonary arterial tree is fairly well opacified. There is moderate right-sided filling defects consistent with pulmonary embolism. No findings to  suggest right heart strain. The RV LV ratio is normal. Coronary artery calcifications and surgical changes from bypass surgery. Mediastinum/Nodes: Mediastinal and hilar lymph nodes are noted. Right hilar node on image number 50 measures 25.5 x 15 mm. The esophagus is grossly. Lungs/Pleura: Moderate patchy bilateral lower lobe atelectasis without definite infiltrate, effusion or edema. No worrisome pulmonary lesions. Upper Abdomen: No significant upper abdominal findings. The abdominal aorta is normal in caliber. No dissection. Scattered atherosclerotic calcifications. Moderate tortuosity distally. Musculoskeletal: No significant bony findings. Advanced degenerative changes involving the thoracic spine but no bone lesion or fracture. Sternal wires related to prior bypass surgery. The Review of the MIP images confirms the above findings. IMPRESSION: 1. Right-sided pulmonary embolism without findings for right heart strain. 2. Tortuosity, ectasia and atherosclerotic calcifications involving the thoracic aorta but no dissection. 3. Three-vessel coronary artery calcifications and surgical changes from coronary artery bypass surgery. 4. Bibasilar  atelectasis but no definite infiltrates, edema or effusions. Aortic Atherosclerosis (ICD10-I70.0). Electronically Signed   By: Marijo Sanes M.D.   On: 07/03/2016 14:51        Scheduled Meds: . atorvastatin  40 mg Oral q1800  . cyanocobalamin  1,000 mcg Intramuscular Daily  . dorzolamide-timolol  1 drop Right Eye BID  . fentaNYL (SUBLIMAZE) injection  50 mcg Intravenous Once  . latanoprost  1 drop Right Eye QHS  . mouth rinse  15 mL Mouth Rinse q12n4p   Continuous Infusions: . sodium chloride 125 mL/hr (07/05/16 0848)  . cefTRIAXone (ROCEPHIN)  IV Stopped (07/04/16 1905)  . heparin 800 Units/hr (07/04/16 2134)  . sodium chloride irrigation       LOS: 2 days    Time spent: [place], MD [facility] Pager [phone]  If  7PM-7AM, please contact night-coverage [email or web address] Password [facility] 07/05/2016, 10:43 AM

## 2016-07-05 NOTE — Progress Notes (Signed)
ANTICOAGULATION CONSULT NOTE - Follow Up Consult  Pharmacy Consult for heparin Indication: pulmonary embolus  Labs:  Recent Labs  07/03/16 0819 07/04/16 0952 07/04/16 0958 07/04/16 1933 07/05/16 0528  HGB 14.4  --  13.8  --   --   HCT 42.8  --  40.2  --   --   PLT 130*  --  123*  --   --   LABPROT  --  18.1*  --   --   --   INR  --  1.48  --   --   --   HEPARINUNFRC  --  0.84*  --  0.93* 0.66  CREATININE 1.06 2.24*  --   --  2.38*    Assessment/Plan:  83yo male therapeutic on heparin after rate changes. Will continue gtt at current rate and confirm stable with additional level.   Wynona Neat, PharmD, BCPS  07/05/2016,6:36 AM

## 2016-07-05 NOTE — Progress Notes (Signed)
OT Cancellation Note  Patient Details Name: Luis Pierce MRN: [id number] DOB: 05-19-1922   Cancelled Treatment:    Reason Eval/Treat Not Completed: Patient not medically ready (pt with acute neuro changes and undergoing CVA workup). Will follow up for OT eval as time allows and pt medically appropriate.   Binnie Kand M.S., OTR/L Pager: [phone]  07/05/2016, 3:02 PM

## 2016-07-05 NOTE — Progress Notes (Signed)
ANTICOAGULATION CONSULT NOTE - Follow Up Consult  Pharmacy Consult for Heparin Indication: pulmonary embolus  No Known Allergies  Patient Measurements: Height: 5\' 10"  (177.8 cm) Weight: 178 lb 6.4 oz (80.9 kg) IBW/kg (Calculated) : 73 Heparin Dosing Weight:  81.7 kg  Vital Signs: Temp: 98 F (36.7 C) (06/18 1458) Temp Source: Oral (06/18 1458) BP: 105/52 (06/18 1458) Pulse Rate: 82 (06/18 1458)  Labs:  Recent Labs  07/03/16 0819  07/04/16 0952 07/04/16 0958 07/04/16 1933 07/05/16 0528 07/05/16 1535  HGB 14.4  --   --  13.8  --  11.5*  --   HCT 42.8  --   --  40.2  --  34.5*  --   PLT 130*  --   --  123*  --  159  --   LABPROT  --   --  18.1*  --   --   --   --   INR  --   --  1.48  --   --   --   --   HEPARINUNFRC  --   < > 0.84*  --  0.93* 0.66 0.64  CREATININE 1.06  --  2.24*  --   --  2.38*  --   < > = values in this interval not displayed.  Estimated Creatinine Clearance: 20.9 mL/min (A) (by C-G formula based on SCr of 2.38 mg/dL (H)).  Assessment: 60 yom continuing on IV hep for acute PE. Hematuria reported but ok to continue heparin for now per MD. CT head negative for bleed.  Heparin level remains therapeutic at 0.64 on 800 units/h. Hg down 11.5, plt up 159.   Goal of Therapy:  Heparin level 0.3-0.7 units/ml Monitor platelets by anticoagulation protocol: Yes   Plan:  Heparin at 800 units/hr Daily heparin level, CBC Monitor closely for increased s/sx bleeding   Elicia Lamp, PharmD, BCPS Clinical Pharmacist Rx Phone # for today: [phone] After 3:30PM, please call Main Rx: [id number] 07/05/2016 4:38 PM

## 2016-07-05 NOTE — Progress Notes (Signed)
Reviewed chart and my chart CBI looks light pink S/p area not distended Bladder u/sound pending Good prognosis Will discuss with family- nurse given my number

## 2016-07-06 ENCOUNTER — Inpatient Hospital Stay (HOSPITAL_COMMUNITY): Payer: Medicare Other

## 2016-07-06 DIAGNOSIS — N179 Acute kidney failure, unspecified: Secondary | ICD-10-CM

## 2016-07-06 LAB — CBC
HCT: 28.9 % — ABNORMAL LOW (ref 39.0–52.0)
Hemoglobin: 9.5 g/dL — ABNORMAL LOW (ref 13.0–17.0)
MCH: 29.4 pg (ref 26.0–34.0)
MCHC: 32.9 g/dL (ref 30.0–36.0)
MCV: 89.5 fL (ref 78.0–100.0)
PLATELETS: 178 10*3/uL (ref 150–400)
RBC: 3.23 MIL/uL — AB (ref 4.22–5.81)
RDW: 14.2 % (ref 11.5–15.5)
WBC: 14.3 10*3/uL — AB (ref 4.0–10.5)

## 2016-07-06 LAB — BASIC METABOLIC PANEL
ANION GAP: 8 (ref 5–15)
BUN: 66 mg/dL — ABNORMAL HIGH (ref 6–20)
CALCIUM: 7.9 mg/dL — AB (ref 8.9–10.3)
CO2: 19 mmol/L — AB (ref 22–32)
CREATININE: 2.61 mg/dL — AB (ref 0.61–1.24)
Chloride: 110 mmol/L (ref 101–111)
GFR, EST AFRICAN AMERICAN: 23 mL/min — AB (ref >60)
GFR, EST NON AFRICAN AMERICAN: 20 mL/min — AB (ref >60)
Glucose, Bld: 110 mg/dL — ABNORMAL HIGH (ref 65–99)
Potassium: 4.3 mmol/L (ref 3.5–5.1)
SODIUM: 137 mmol/L (ref 135–145)

## 2016-07-06 LAB — HEPARIN LEVEL (UNFRACTIONATED): HEPARIN UNFRACTIONATED: 0.54 [IU]/mL (ref 0.30–0.70)

## 2016-07-06 NOTE — Progress Notes (Signed)
Rehab Admissions Coordinator Note:  Patient was screened by Cleatrice Burke for appropriateness for an Inpatient Acute Rehab Consult per PT and OT recommendations.   At this time, we are recommending Inpatient Rehab consult.  Cleatrice Burke 07/06/2016, 4:24 PM  I can be reached at [phone].

## 2016-07-06 NOTE — Progress Notes (Signed)
Pt came down for brain. c and t spine MRI's.  Pt was sleepy at scan time, completed brain and about 3/4 of the way through the cervical pt started yelling that he was tired and refused further imaging.  Films acquitted were turned in for reading, please check report to see if any further imaging needs to be done and let us know.  Thank you.

## 2016-07-06 NOTE — Consult Note (Signed)
[facility] Consult Note     Date: 07/06/2016                  Patient Name:  Luis Pierce  MRN: [id number]  DOB: [birth year]  Age / Sex: 83 y.o., male         PCP: Merlene Laughter, MD                 Service Requesting Consult: [facility]                 Reason for Consult: AKI            Chief Complaint: LE weakness  HPI: Pt is a 47M with a PMH significant for HTN, HLD, h/o prostate cancer s/p prostatectomy and AUS placement, CAD, and glaucoma who is now seen in consultation at the request of Dr. Janee Morn for evaluation and recommendations surrounding acute on chronic kidney injury.    Briefly, pt was in his usual state of health until the day prior to admission when he was reporting severe back pain.  Initially, it was relieved by laying back, but then returned.  His legs became acutely weak and caused him to slump to the floor.  He also reported chest pain and was taken to the ED.  He was found to have an acute R sided PE (and a DVT) and was placed on a heparin gtt.  Before being placed on a heparin gtt, he had gross hematuria with passage of clots for which urology was consulted; Foley was placed for CBI.  UA was suggestive of UTI for which he is receiving CTX.  In regards to his bilateral LE weakness, CT of the head was negative for acute infarct.  MRI brain, C and T spine are pending.  He is severely B12 deficient and has just been started on B12 injections this admission.    Pt's baseline creatinine appears to be around 1; Renal US reveals diffuse cortical thinning indicating CKD; no hydro.  Creatinine trend as follows: 1.06--> 2.24-->2.38-->2.61 (today).    He is not oliguric.  He is a bit confused today.  Dtr is at the bedside who is a PCP (Dr. Merri Brunette).  Had consumed NSAIDs the week prior due to back pain.  No other OTC meds. No new antibiotics except for what he's getting here.  BP s have been a little soft.  Holding BP meds.  Past Medical  History:  Diagnosis Date  . Blind left eye    retinal detached retina  . 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 Chronic kidney disease    hx kidney stones and urinary incontinence  . Colon cancer (HCC)   . Coronary artery disease   . Diverticulosis   . DJD (degenerative joint disease)   . Glaucoma   . Hyperlipemia   . Hypertension   . Lipoma of back   . Melanoma of back (HCC)   . Mild mitral regurgitation   . Prostate cancer (HCC)   . Retinal detachment   . Urinary incontinence   . Urinary incontinence, male, stress 07/04/2016    Past Surgical History:  Procedure Laterality Date  . APPENDECTOMY    . CARDIAC CATHETERIZATION    . CHOLECYSTECTOMY    . CORONARY ARTERY BYPASS GRAFT  1982   1 vessel  . CYSTOSCOPY  09/21/2011   Procedure: CYSTOSCOPY;  Surgeon: Martina Sinner, MD;  Location: [facility];  Service: Urology;  Laterality: N/A;  . EYE SURGERY  detached retinas bilateral,bil.cataract  . TONSILLECTOMY     as child  . URINARY SPHINCTER IMPLANT  09/21/2011   Procedure: ARTIFICIAL URINARY SPHINCTER;  Surgeon: Reece Packer, MD;  Location: [facility];  Service: Urology;  Laterality: N/A;    History reviewed. No pertinent family history. Social History:  reports that he has never smoked. He has never used smokeless tobacco. He reports that he drinks about 0.6 oz of alcohol per week . He reports that he does not use drugs.  Allergies: No Known Allergies  Medications Prior to Admission  Medication Sig Dispense Refill  . aspirin EC 81 MG tablet Take 81 mg by mouth daily.    Marland Kitchen atorvastatin (LIPITOR) 40 MG tablet Take 40 mg by mouth daily.     . dorzolamide-timolol (COSOPT) 22.3-6.8 MG/ML ophthalmic solution Place 1 drop into the right eye 2 (two) times daily.    Marland Kitchen latanoprost (XALATAN) 0.005 % ophthalmic solution Place 1 drop into the right eye at bedtime.    . metoprolol succinate (TOPROL-XL) 50 MG 24 hr tablet Take 50 mg by mouth daily after breakfast. Take with or  immediately following a meal.      Results for orders placed or performed during the hospital encounter of 07/03/16 (from the past 48 hour(s))  Heparin level (unfractionated)     Status: Abnormal   Collection Time: 07/04/16  7:33 PM  Result Value Ref Range   Heparin Unfractionated 0.93 (H) 0.30 - 0.70 IU/mL    Comment:        IF HEPARIN RESULTS ARE BELOW EXPECTED VALUES, AND PATIENT DOSAGE HAS BEEN CONFIRMED, SUGGEST FOLLOW UP TESTING OF ANTITHROMBIN III LEVELS.   CBC     Status: Abnormal   Collection Time: 07/05/16  5:28 AM  Result Value Ref Range   WBC 15.4 (H) 4.0 - 10.5 K/uL   RBC 3.80 (L) 4.22 - 5.81 MIL/uL   Hemoglobin 11.5 (L) 13.0 - 17.0 g/dL   HCT 34.5 (L) 39.0 - 52.0 %   MCV 90.8 78.0 - 100.0 fL   MCH 30.3 26.0 - 34.0 pg   MCHC 33.3 30.0 - 36.0 g/dL   RDW 14.7 11.5 - 15.5 %   Platelets 159 150 - 400 K/uL  Heparin level (unfractionated)     Status: None   Collection Time: 07/05/16  5:28 AM  Result Value Ref Range   Heparin Unfractionated 0.66 0.30 - 0.70 IU/mL    Comment:        IF HEPARIN RESULTS ARE BELOW EXPECTED VALUES, AND PATIENT DOSAGE HAS BEEN CONFIRMED, SUGGEST FOLLOW UP TESTING OF ANTITHROMBIN III LEVELS.   Basic metabolic panel     Status: Abnormal   Collection Time: 07/05/16  5:28 AM  Result Value Ref Range   Sodium 135 135 - 145 mmol/L   Potassium 4.3 3.5 - 5.1 mmol/L   Chloride 107 101 - 111 mmol/L   CO2 18 (L) 22 - 32 mmol/L   Glucose, Bld 121 (H) 65 - 99 mg/dL   BUN 48 (H) 6 - 20 mg/dL   Creatinine, Ser 2.38 (H) 0.61 - 1.24 mg/dL   Calcium 8.4 (L) 8.9 - 10.3 mg/dL   GFR calc non Af Amer 22 (L) >60 mL/min   GFR calc Af Amer 26 (L) >60 mL/min    Comment: (NOTE) The eGFR has been calculated using the CKD EPI equation. This calculation has not been validated in all clinical situations. eGFR's persistently <60 mL/min signify possible Chronic Kidney Disease.  Anion gap 10 5 - 15  Heparin level (unfractionated)     Status: None    Collection Time: 07/05/16  3:35 PM  Result Value Ref Range   Heparin Unfractionated 0.64 0.30 - 0.70 IU/mL    Comment:        IF HEPARIN RESULTS ARE BELOW EXPECTED VALUES, AND PATIENT DOSAGE HAS BEEN CONFIRMED, SUGGEST FOLLOW UP TESTING OF ANTITHROMBIN III LEVELS.   CBC     Status: Abnormal   Collection Time: 07/06/16  3:21 AM  Result Value Ref Range   WBC 14.3 (H) 4.0 - 10.5 K/uL   RBC 3.23 (L) 4.22 - 5.81 MIL/uL   Hemoglobin 9.5 (L) 13.0 - 17.0 g/dL   HCT 28.9 (L) 39.0 - 52.0 %   MCV 89.5 78.0 - 100.0 fL   MCH 29.4 26.0 - 34.0 pg   MCHC 32.9 30.0 - 36.0 g/dL   RDW 14.2 11.5 - 15.5 %   Platelets 178 150 - 400 K/uL  Heparin level (unfractionated)     Status: None   Collection Time: 07/06/16  3:21 AM  Result Value Ref Range   Heparin Unfractionated 0.54 0.30 - 0.70 IU/mL    Comment:        IF HEPARIN RESULTS ARE BELOW EXPECTED VALUES, AND PATIENT DOSAGE HAS BEEN CONFIRMED, SUGGEST FOLLOW UP TESTING OF ANTITHROMBIN III LEVELS.   Basic metabolic panel     Status: Abnormal   Collection Time: 07/06/16  3:21 AM  Result Value Ref Range   Sodium 137 135 - 145 mmol/L   Potassium 4.3 3.5 - 5.1 mmol/L   Chloride 110 101 - 111 mmol/L   CO2 19 (L) 22 - 32 mmol/L   Glucose, Bld 110 (H) 65 - 99 mg/dL   BUN 66 (H) 6 - 20 mg/dL   Creatinine, Ser 2.61 (H) 0.61 - 1.24 mg/dL   Calcium 7.9 (L) 8.9 - 10.3 mg/dL   GFR calc non Af Amer 20 (L) >60 mL/min   GFR calc Af Amer 23 (L) >60 mL/min    Comment: (NOTE) The eGFR has been calculated using the CKD EPI equation. This calculation has not been validated in all clinical situations. eGFR's persistently <60 mL/min signify possible Chronic Kidney Disease.    Anion gap 8 5 - 15   Ct Head Wo Contrast  Result Date: 07/05/2016 CLINICAL DATA:  Altered mental status and slurred speech. Agitation, new confusion, in a patient with anticoagulation. Acute lower extremity weakness concerning for cord infarct. EXAM: CT HEAD WITHOUT CONTRAST  TECHNIQUE: Contiguous axial images were obtained from the base of the skull through the vertex without intravenous contrast. COMPARISON:  07/03/2016. FINDINGS: Motion degradation due to snoring. Brain: No evidence for acute infarction, hemorrhage, mass lesion, hydrocephalus, or extra-axial fluid. Generalized atrophy. Chronic microvascular ischemic change. Vascular: No signs of proximal large vessel occlusion. Advanced atherosclerotic calcification of the skull base internal carotid arteries and distal vertebral arteries. Skull: Normal. Negative for fracture or focal lesion. Sinuses/Orbits: No layering fluid in the sinuses. Unusual appearing banding devices are noted, surrounding both globes. The band on the RIGHT is partially metallic. The band on the LEFT is low attenuation, near fat density. BILATERAL cataract extraction. Other: None. Compared with 07/03/2016, similar appearance. IMPRESSION: In this patient who is anticoagulated, with altered mental status, there are no acute areas of parenchymal or subarachnoid hemorrhage. Stable appearing atrophy with extensive white matter disease. BILATERAL ocular banding procedures, as discussed above. Electronically Signed   By: Roderic Ovens.D.  On: 07/05/2016 13:39   US Pelvis Limited  Result Date: 07/06/2016 CLINICAL DATA:  Hematuria.  Concern for blood clot or bladder mass. EXAM: LIMITED ULTRASOUND OF PELVIS TECHNIQUE: Limited transabdominal ultrasound examination of the pelvis was performed. COMPARISON:  CT 01/26/2016 FINDINGS: Urinary bladder is minimally distended. There is some heterogeneous intraluminal debris. Hyperdensity dependently measures 1.6 x 0.8 x 2.0 cm with no definite posterior shadowing. IMPRESSION: Minimally distended bladder with some intraluminal debris. Dependent hyperdensity measuring 1.6 x 0.8 x 2.0 cm has no definite posterior shadowing. This may reflect small stones or clot. Electronically Signed   By: Jeb Levering M.D.   On:  07/06/2016 04:28   US Renal  Result Date: 07/04/2016 CLINICAL DATA:  83 year old male with acute renal failure. EXAM: RENAL / URINARY TRACT ULTRASOUND COMPLETE COMPARISON:  Abdominal CT dated 01/26/2016 FINDINGS: Right Kidney: Length: 10.1 cm. There is mild diffuse parenchymal atrophy and cortical thinning. Mild increased echogenicity. No hydronephrosis or echogenic stone. Left Kidney: Length: 10.8 cm. Mild diffuse parenchymal atrophy and cortical thinning. Mild increased echogenicity. No hydronephrosis or echogenic stone. Bladder: The urinary bladder is decompressed around a Foley catheter. Incidental note of an echogenic liver, likely related to fatty infiltration. IMPRESSION: 1. Mild renal atrophy, likely age-related. No hydronephrosis or echogenic stone. 2. Fatty liver. Electronically Signed   By: Anner Crete M.D.   On: 07/04/2016 23:36    ROS: all other systems reviewed and are negative except as per HPI  Blood pressure (!) 101/47, pulse 95, temperature 98 F (36.7 C), temperature source Axillary, resp. rate 18, height '5\' 10"'$  (1.778 m), weight 84.2 kg (185 lb 11.2 oz), SpO2 98 %. Physical Exam  GEN elderly, NAD HEENT EOMI, PERRL, tacky MM NECK 2cm JVD upright PULM normal WOB, faint bibasilar rales CV III/VI systolic murmur no r/g ABD nontender, nondistended, well-healed infra-umbilical surgical scar and in the RLQ.  No hepatosplenomegaly. EXT no LE edema NEURO alert to dtr and situation, some mental slowing SKIN + small hematoma L antecubital space MSK no effusions  Assessment/Plan  1.  Acute kidney injury superimposed on chronic kidney disease: I suspect CKD secondary to HTN.  Renal US with cortical thinning.  I don't think a large diagnostic workup is necessary.  UP/C won't be accurate because of CBI.  AKI likely due to relative hypotension in setting of acute illness and some NSAID use.  [place] doesn't indicate renal infarct.  Rate of rise is slowing; expect plateau in the next day  or so.  Agree with fluids for now but I'm putting a stop time on them since he may be getting a little overloaded.  Avoid IV contrast, fleets enemas, NSAIDs, mag citrate, PPIs.   2.  Bilateral LE weakness: neurology consulted.  Severely B12 deficient.  MRIs pending.      3.  DVT/PE: on heparin gtt; in setting of AKI don't recommend NOAC at this time.    4.  Gross hematuria: Likely due to known bladder lesion, don't suspect this is related to GN.  Per urology.  CBI ongoing.  5.  HTN: hold BP meds in setting of soft Bps.     Madelon Lips MD [facility] pgr [phone] 07/06/2016, 10:36 AM

## 2016-07-06 NOTE — Progress Notes (Signed)
PROGRESS NOTE    Luis Pierce  SWN:[id number] DOB: [birth year] DOA: 07/03/2016 PCP: Lajean Manes, MD    Brief Narrative:  Patient is a 83 year old gentleman presented to the ED with generalized weakness to the point where patient slid to the floor and complaints of back pain. Patient also noted to have gross hematuria as well as right-sided pulmonary emboli as well as lower extremity DVT. Patient on IV heparin. Neurology and urology consulted. Patient on CBI is. Patient now with worsening renal function nephrology consulted.   Assessment & Plan:   Principal Problem:   Bilateral leg weakness Active Problems:   Pulmonary embolism (HCC)   Gross hematuria   Generalized weakness   Hypertension   Hyperlipidemia   Hypothermia   Dehydration   [name]   Urinary incontinence, male, stress   Pulmonary embolism on right [facility])   Thoracic back pain   Acute lower UTI   B12 deficiency   ARF (acute renal failure) (HCC)   Leg weakness  #1 bilateral leg weakness/upper back pain Questionable etiology. Concern for cortical infarction. Patient had presented with lower extremity weakness that he slid to the floor with complaints of back pain. CT T-spine with advanced degenerative changes but no compression fractures noted. CT L-spine with no significant acute abnormalities explaining patient's symptoms. Patient has been seen in consultation by neurology while recommended MRI of the brain and c and T-spine if able to be obtained following identification of metallic right globe instrumentation seen on CT head as ferromagnetic versus nonferromagnetic. Patient's ophthalmologist has been reached per patient's daughter and is noted that patient's right globe implantation and scleral banding all consistent with MRI scanning. Patient also noted to have significantly low levels of vitamin B-12 of < 50. B-12 level is being replaced. PT/OT. Neurology following and appreciate input and  recommendations.  #2 acute PE/R LE DVT Questionable etiology. 2-D echo negative for right ventricular strain. Lower extremity Dopplers with right lower extremity DVT in the right peroneal vein. Patient currently on IV heparin. Patient with gross hematuria and okay per Urology to continue anticoagulation.   #3 gross hematuria Patient with a history of stress urinary incontinence status post artificial urinary sphincter placement. ?? Etiology. Urinalysis was turbid with many bacteria to numerous to count WBCs, too numerous to count RBCs. Urine cultures were less than 10,000 colonies. Patient afebrile. Patient with history of prostate cancer status post prostatectomy per family. Patient also with a history of colon cancer. Foley catheter initially unable to be placed secondary to artificial urinary sphincter. Patient seen in consultation by urology 07/04/2016 and AUS deactivated and Foley catheter placed. Patient started on continuous bladder irrigation to keep urine pink in color per urology. Hematuria improved and urine now clear. Patient has been seen by urology who are going to discontinue CBI's and follow urine output. Patient will likely need outpatient cystoscopy. Urology following.   #4 dehydration IV fluids.  #5 acute blood loss anemia Likely secondary to gross hematuria. Follow H/H.  #6 [name] Patient noted to have bouts of confusion the night of admission. Patient started on Zyprexa last however per family patient with agitation and some confusion overnight after Zyprexa. Discontinued Zyprexa. Ativan daily at bedtime when necessary.   #7 hypertension Blood pressure was borderline. Discontinued metoprolol. Increase IVF to 150cc/h.  #8 probable UTI Urine cultures with less than 10,000 colonies with insignificant growth. D/C IV Rocephin as blood cultures WNL.   #9 hyperlipidemia Continue statin.  #10 vitamin B 12  deficiency Vitamin B 12 1000 MCG's IM daily 1 week,  and then weekly times one month and then monthly.   #11 acute renal failure Patient with worsening renal function. Likely secondary to a prerenal azotemia. Patient looks clinically dry on examination and patient noted to have borderline blood pressure. Renal ultrasound done was negative for hydronephrosis are the digits show cortical thinning. Patient likely with chronic kidney disease. Unable to check a fractional excretion of sodium oh urine electrolytes as patient is on CBI. Increase IV fluids to normal saline 150 mLcc/h for the next 24-48 hours. Consult with nephrology for further evaluation and management.   DVT prophylaxis: On IV heparin Code Status: Full Family Communication: Updated patient and family at bedside. Disposition Plan: Hopefully CIR vs SNF once gross hematuria has resolved, patient treated for his PE/DVT, lower extremity weakness and back pain workup completed and per neurology, urology and resolution of ARF.   Consultants:   Neurology: Dr.Lindzen 07/03/2016  Urology: Dr Jonny Ruiz 07/04/2016  Procedures:   CT L-spine 07/03/2016   CT angiogram chest 07/03/2016  CT head 07/03/2016  Chest x-ray 07/03/2016  CT C-spine 07/03/2016  2-D echo 07/04/2016  RLE DVT 07/04/2016  Antimicrobials:   IV Rocephin 07/03/2016>>>>> 07/06/2016   Subjective: Patient less confused today per family. Patient denies any chest pain. No shortness of breath. Patient with some complaints of back pain.   Objective: Vitals:   07/05/16 0420 07/05/16 1458 07/05/16 2101 07/06/16 0547  BP: (!) 90/56 (!) 105/52 99/74 (!) 101/47  Pulse: 82 82 81 95  Resp: 18 18 18 18   Temp: 97.5 F (36.4 C) 98 F (36.7 C) 98.2 F (36.8 C) 98 F (36.7 C)  TempSrc: Axillary Oral Axillary Axillary  SpO2: 93% 94% 99% 98%  Weight: 80.9 kg (178 lb 6.4 oz)   84.2 kg (185 lb 11.2 oz)  Height:        Intake/Output Summary (Last 24 hours) at 07/06/16 1214 Last data filed at 07/06/16 1008  Gross per 24 hour   Intake            12586 ml  Output            19425 ml  Net            -6839 ml   Filed Weights   07/04/16 0659 07/05/16 0420 07/06/16 0547  Weight: 81.7 kg (180 lb 3.2 oz) 80.9 kg (178 lb 6.4 oz) 84.2 kg (185 lb 11.2 oz)    Examination:  General exam: Sleeping. Dry mucous membranes. Respiratory system: Clear to auscultation anterior lung fields. Respiratory effort normal. Cardiovascular system: S1 & S2 heard, RRR. No JVD, murmurs, rubs, gallops or clicks. No pedal edema. Gastrointestinal system: Abdomen is nondistended, soft and nontender. No organomegaly or masses felt. Normal bowel sounds heard. Central nervous system: Sleeping however easily arousable. No focal neurological deficits. Extremities: Unable to lift LE. Skin: No rashes, lesions or ulcers Psychiatry: Judgement and insight appear normal. Mood & affect appropriate.  Genitourinary: FC with clear urine.    Data Reviewed: I have personally reviewed following labs and imaging studies  CBC:  Recent Labs Lab 07/03/16 0819 07/04/16 0952 07/04/16 0958 07/05/16 0528 07/06/16 0321  WBC 12.6*  --  14.1* 15.4* 14.3*  NEUTROABS 11.0*  --  11.7*  --   --   HGB 14.4  --  13.8 11.5* 9.5*  HCT 42.8 40.3 40.2 34.5* 28.9*  MCV 88.6  --  88.9 90.8 89.5  PLT 130*  --  123* 159 557   Basic Metabolic Panel:  Recent Labs Lab 07/03/16 0819 07/04/16 0952 07/05/16 0528 07/06/16 0321  NA 138 139 135 137  K 3.9 4.2 4.3 4.3  CL 109 109 107 110  CO2 22 19* 18* 19*  GLUCOSE 128* 103* 121* 110*  BUN 24* 34* 48* 66*  CREATININE 1.06 2.24* 2.38* 2.61*  CALCIUM 8.9 8.8* 8.4* 7.9*   GFR: Estimated Creatinine Clearance: 19 mL/min (A) (by C-G formula based on SCr of 2.61 mg/dL (H)). Liver Function Tests:  Recent Labs Lab 07/03/16 0819 07/03/16 1803 07/04/16 0952  AST 32  --  27  ALT 17  --  16*  ALKPHOS 191*  --  187*  BILITOT 1.8* 1.4* 0.8  PROT 5.9*  --  5.4*  ALBUMIN 3.1*  --  2.8*   No results for input(s):  LIPASE, AMYLASE in the last 168 hours. No results for input(s): AMMONIA in the last 168 hours. Coagulation Profile:  Recent Labs Lab 07/04/16 0952  INR 1.48   Cardiac Enzymes: No results for input(s): CKTOTAL, CKMB, CKMBINDEX, TROPONINI in the last 168 hours. BNP (last 3 results) No results for input(s): PROBNP in the last 8760 hours. HbA1C: No results for input(s): HGBA1C in the last 72 hours. CBG:  Recent Labs Lab 07/03/16 1516  GLUCAP 118*   Lipid Profile: No results for input(s): CHOL, HDL, LDLCALC, TRIG, CHOLHDL, LDLDIRECT in the last 72 hours. Thyroid Function Tests:  Recent Labs  07/04/16 0952  TSH 1.965   Anemia Panel:  Recent Labs  07/04/16 0958  VITAMINB12 <50*   Sepsis Labs: No results for input(s): PROCALCITON, LATICACIDVEN in the last 168 hours.  Recent Results (from the past 240 hour(s))  Urine culture     Status: Abnormal   Collection Time: 07/03/16  3:16 PM  Result Value Ref Range Status   Specimen Description URINE, RANDOM  Final   Special Requests NONE  Final   Culture <10,000 COLONIES/mL INSIGNIFICANT GROWTH (A)  Final   Report Status 07/04/2016 FINAL  Final  Culture, blood (Routine X 2) w Reflex to ID Panel     Status: None (Preliminary result)   Collection Time: 07/03/16  6:08 PM  Result Value Ref Range Status   Specimen Description BLOOD LEFT HAND  Final   Special Requests IN PEDIATRIC BOTTLE Blood Culture adequate volume  Final   Culture NO GROWTH 2 DAYS  Final   Report Status PENDING  Incomplete  Culture, blood (Routine X 2) w Reflex to ID Panel     Status: None (Preliminary result)   Collection Time: 07/03/16  6:13 PM  Result Value Ref Range Status   Specimen Description BLOOD RIGHT ANTECUBITAL  Final   Special Requests IN PEDIATRIC BOTTLE Blood Culture adequate volume  Final   Culture NO GROWTH 2 DAYS  Final   Report Status PENDING  Incomplete         Radiology Studies: Ct Head Wo Contrast  Result Date:  07/05/2016 CLINICAL DATA:  Altered mental status and slurred speech. Agitation, new confusion, in a patient with anticoagulation. Acute lower extremity weakness concerning for cord infarct. EXAM: CT HEAD WITHOUT CONTRAST TECHNIQUE: Contiguous axial images were obtained from the base of the skull through the vertex without intravenous contrast. COMPARISON:  07/03/2016. FINDINGS: Motion degradation due to snoring. Brain: No evidence for acute infarction, hemorrhage, mass lesion, hydrocephalus, or extra-axial fluid. Generalized atrophy. Chronic microvascular ischemic change. Vascular: No signs of proximal large vessel occlusion. Advanced atherosclerotic calcification of the  skull base internal carotid arteries and distal vertebral arteries. Skull: Normal. Negative for fracture or focal lesion. Sinuses/Orbits: No layering fluid in the sinuses. Unusual appearing banding devices are noted, surrounding both globes. The band on the RIGHT is partially metallic. The band on the LEFT is low attenuation, near fat density. BILATERAL cataract extraction. Other: None. Compared with 07/03/2016, similar appearance. IMPRESSION: In this patient who is anticoagulated, with altered mental status, there are no acute areas of parenchymal or subarachnoid hemorrhage. Stable appearing atrophy with extensive white matter disease. BILATERAL ocular banding procedures, as discussed above. Electronically Signed   By: Staci Righter M.D.   On: 07/05/2016 13:39   US Pelvis Limited  Result Date: 07/06/2016 CLINICAL DATA:  Hematuria.  Concern for blood clot or bladder mass. EXAM: LIMITED ULTRASOUND OF PELVIS TECHNIQUE: Limited transabdominal ultrasound examination of the pelvis was performed. COMPARISON:  CT 01/26/2016 FINDINGS: Urinary bladder is minimally distended. There is some heterogeneous intraluminal debris. Hyperdensity dependently measures 1.6 x 0.8 x 2.0 cm with no definite posterior shadowing. IMPRESSION: Minimally distended bladder  with some intraluminal debris. Dependent hyperdensity measuring 1.6 x 0.8 x 2.0 cm has no definite posterior shadowing. This may reflect small stones or clot. Electronically Signed   By: Jeb Levering M.D.   On: 07/06/2016 04:28   US Renal  Result Date: 07/04/2016 CLINICAL DATA:  83 year old male with acute renal failure. EXAM: RENAL / URINARY TRACT ULTRASOUND COMPLETE COMPARISON:  Abdominal CT dated 01/26/2016 FINDINGS: Right Kidney: Length: 10.1 cm. There is mild diffuse parenchymal atrophy and cortical thinning. Mild increased echogenicity. No hydronephrosis or echogenic stone. Left Kidney: Length: 10.8 cm. Mild diffuse parenchymal atrophy and cortical thinning. Mild increased echogenicity. No hydronephrosis or echogenic stone. Bladder: The urinary bladder is decompressed around a Foley catheter. Incidental note of an echogenic liver, likely related to fatty infiltration. IMPRESSION: 1. Mild renal atrophy, likely age-related. No hydronephrosis or echogenic stone. 2. Fatty liver. Electronically Signed   By: Anner Crete M.D.   On: 07/04/2016 23:36        Scheduled Meds: . atorvastatin  40 mg Oral q1800  . cyanocobalamin  1,000 mcg Intramuscular Daily  . dorzolamide-timolol  1 drop Right Eye BID  . fentaNYL (SUBLIMAZE) injection  50 mcg Intravenous Once  . latanoprost  1 drop Right Eye QHS  . mouth rinse  15 mL Mouth Rinse q12n4p   Continuous Infusions: . sodium chloride 150 mL/hr at 07/06/16 1122  . cefTRIAXone (ROCEPHIN)  IV Stopped (07/05/16 1900)  . heparin 800 Units/hr (07/05/16 1659)  . sodium chloride irrigation       LOS: 3 days    Time spent: [place], MD [facility] Pager [phone]  If 7PM-7AM, please contact night-coverage [email or web address] Password [email or web address] 07/06/2016, 12:14 PM

## 2016-07-06 NOTE — Evaluation (Signed)
Physical Therapy Evaluation Patient Details Name: Luis Pierce MRN: [id number] DOB: 1922/07/31 Today's Date: 07/06/2016   History of Present Illness  Pt adm with acute PE and no movement in LE's. Work up in progress. MRI pending. 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 Pt also with UTI, acute kidney injury, and low B12. PMH - glaucoma with very poor vision, colon CA, prostate CA,   Clinical Impression  Pt admitted with above diagnosis and presents to PT with functional limitations due to deficits listed below (See PT problem list). Pt needs skilled PT to maximize independence and safety to allow discharge to CIR for intensive rehab to address issues due to paraplegia. Pt motivated and will need significant therapy to maximize potential.     Follow Up Recommendations CIR    Equipment Recommendations  Other (comment) (To be assessed)    Recommendations for Other Services Rehab consult     Precautions / Restrictions Precautions Precautions: Fall Restrictions Weight Bearing Restrictions: No      Mobility  Bed Mobility Overal bed mobility: Needs Assistance Bed Mobility: Rolling Rolling: Total assist         General bed mobility comments: Assist to bring LE into flexion and pt able to assist by reaching with UE to hold and pull on the rail. Attempted to bring trunk forward into long sitting but only able to bring slightly forward without 2 person assist.  Transfers                    Ambulation/Gait                Stairs            Wheelchair Mobility    Modified Rankin (Stroke Patients Only)       Balance                                             Pertinent Vitals/Pain Pain Assessment: Faces Faces Pain Scale: Hurts little more Pain Location: Upper back with movement Pain Descriptors / Indicators: Grimacing Pain Intervention(s): Limited activity within patient's tolerance;Monitored during session;Repositioned    Home Living  Family/patient expects to be discharged to:: Private residence Living Arrangements: Spouse/significant other (spouse uses walker) Available Help at Discharge: Family;Available 24 hours/day (wife - not able to physically assist) Type of Home: House       Home Layout: Two level;Laundry or work area in basement Paramedic to basement)        Prior Function Level of Independence: Independent               Hand Dominance   Dominant Hand: Right    Extremity/Trunk Assessment   Upper Extremity Assessment Upper Extremity Assessment: Defer to OT evaluation    Lower Extremity Assessment Lower Extremity Assessment: RLE deficits/detail;LLE deficits/detail RLE Deficits / Details: no active movement, PROM is [facility] RLE Sensation: decreased light touch;decreased proprioception LLE Deficits / Details: no active movement, PROM is WFL LLE Sensation: decreased light touch;decreased proprioception       Communication   Communication: HOH  Cognition Arousal/Alertness: Awake/alert (when stimulated but back to sleep quickly) Behavior During Therapy: [facility] for tasks assessed/performed  General Comments      Exercises     Assessment/Plan    PT Assessment Patient needs continued PT services  PT Problem List Decreased strength;Decreased activity tolerance;Decreased balance;Decreased mobility;Impaired sensation       PT Treatment Interventions DME instruction;Functional mobility training;Therapeutic activities;Therapeutic exercise;Balance training;Neuromuscular re-education;Patient/family education    PT Goals (Current goals can be found in the Care Plan section)  Acute Rehab PT Goals Patient Stated Goal: return home PT Goal Formulation: With patient/family Time For Goal Achievement: 07/20/16 Potential to Achieve Goals: Good    Frequency Min 3X/week   Barriers to discharge Decreased caregiver support elderly wife can't  physically assist    Co-evaluation               AM-PAC PT "6 Clicks" Daily Activity  Outcome Measure Difficulty turning over in bed (including adjusting bedclothes, sheets and blankets)?: Total Difficulty moving from lying on back to sitting on the side of the bed? : Total Difficulty sitting down on and standing up from a chair with arms (e.g., wheelchair, bedside commode, etc,.)?: Total Help needed moving to and from a bed to chair (including a wheelchair)?: Total Help needed walking in hospital room?: Total Help needed climbing 3-5 steps with a railing? : Total 6 Click Score: 6    End of Session   Activity Tolerance: Patient tolerated treatment well Patient left: in bed;with call bell/phone within reach;with family/visitor present Nurse Communication: Mobility status;Need for lift equipment PT Visit Diagnosis: Muscle weakness (generalized) (M62.81);Difficulty in walking, not elsewhere classified (R26.2)    Time: 3435-6861 PT Time Calculation (min) (ACUTE ONLY): 21 min   Charges:   PT Evaluation $PT Eval Moderate Complexity: 1 Procedure     PT G CodesMarland Kitchen        [facility] PT [place] 07/06/2016, 4:08 PM

## 2016-07-06 NOTE — Progress Notes (Signed)
Subjective: Some improvement in mental status, less slurred speech.  Exam: Vitals:   07/05/16 2101 07/06/16 0547  BP: 99/74 (!) 101/47  Pulse: 81 95  Resp: 18 18  Temp: 98.2 F (36.8 C) 98 F (36.7 C)   Gen: In bed, NAD Resp: non-labored breathing, no acute distress Abd: soft, nt  Neuro: MS: Awake, Less dysarthric than yesterday, gives month as May and year as 2086, correctly gives age CN: Pupils are postsurgical bilaterally, eyes are disconjugate Motor: He moves bilateral upper extremities well, no voluntary movement of his lower extremities Sensory:triple  Flexion to noxious stimulus in the bilateral lower extremities Toes are up going  bilaterally  Impression: 83 year old male with acute lower extremity weakness concerning for cord infarct. He was also found to have severe B12 deficiency. I discussed with his daughter who is contacted his eye specialist and there is ongoing effort to see if his glaucoma device is safe for MRI.  Recommendations: 1) MRI brain and thoracic spine, would add cervical as well.  2) neurology will continue to follow  Roland Rack, MD [facility] [phone]  If 7pm- 7am, please page neurology on call as listed in [place].

## 2016-07-06 NOTE — Progress Notes (Signed)
OT Cancellation Note  Patient Details Name: Luis Pierce MRN: [id number] DOB: July 04, 1922   Cancelled Treatment:    Reason Eval/Treat Not Completed: Patient not medically ready. Awaiting MRI of brain, cervical and thoracic spine. Will follow.  Malka So 07/06/2016, 11:11 AM  [phone]

## 2016-07-06 NOTE — Progress Notes (Signed)
ANTICOAGULATION CONSULT NOTE - Follow Up Consult  Pharmacy Consult for Heparin Indication: pulmonary embolus  No Known Allergies  Patient Measurements: Height: 5\' 10"  (177.8 cm) Weight: 185 lb 11.2 oz (84.2 kg) IBW/kg (Calculated) : 73 Heparin Dosing Weight:  81.7 kg  Vital Signs: Temp: 98 F (36.7 C) (06/19 0547) Temp Source: Axillary (06/19 0547) BP: 101/47 (06/19 0547) Pulse Rate: 95 (06/19 0547)  Labs:  Recent Labs  07/04/16 0952  07/04/16 0958  07/05/16 0528 07/05/16 1535 07/06/16 0321  HGB  --   < > 13.8  --  11.5*  --  9.5*  HCT 40.3  --  40.2  --  34.5*  --  28.9*  PLT  --   --  123*  --  159  --  178  LABPROT 18.1*  --   --   --   --   --   --   INR 1.48  --   --   --   --   --   --   HEPARINUNFRC 0.84*  --   --   < > 0.66 0.64 0.54  CREATININE 2.24*  --   --   --  2.38*  --  2.61*  < > = values in this interval not displayed.  Estimated Creatinine Clearance: 19 mL/min (A) (by C-G formula based on SCr of 2.61 mg/dL (H)).  Assessment: 82 yom continuing on IV hep for acute PE. Hematuria reported but ok to continue heparin for now per Urology recommendations. CT head negative for bleed.  Heparin level remains therapeutic at 0.54 on 800 units/h. Hg down 9.5, plt up 178.   Goal of Therapy:  Heparin level 0.3-0.7 units/ml Monitor platelets by anticoagulation protocol: Yes   Plan:  Heparin at 800 units/hr Daily heparin level, CBC Monitor closely for increased s/sx bleeding   Elicia Lamp, PharmD, BCPS Clinical Pharmacist Rx Phone # for today: [phone] After 3:30PM, please call Main Rx: #[id number] 07/06/2016 9:12 AM

## 2016-07-07 DIAGNOSIS — I2699 Other pulmonary embolism without acute cor pulmonale: Secondary | ICD-10-CM

## 2016-07-07 DIAGNOSIS — R29898 Other symptoms and signs involving the musculoskeletal system: Secondary | ICD-10-CM

## 2016-07-07 DIAGNOSIS — O2230 Deep phlebothrombosis in pregnancy, unspecified trimester: Secondary | ICD-10-CM

## 2016-07-07 DIAGNOSIS — I6310 Cerebral infarction due to embolism of unspecified precerebral artery: Secondary | ICD-10-CM

## 2016-07-07 DIAGNOSIS — E538 Deficiency of other specified B group vitamins: Secondary | ICD-10-CM

## 2016-07-07 DIAGNOSIS — N170 Acute kidney failure with tubular necrosis: Secondary | ICD-10-CM

## 2016-07-07 DIAGNOSIS — I639 Cerebral infarction, unspecified: Secondary | ICD-10-CM | POA: Insufficient documentation

## 2016-07-07 DIAGNOSIS — L8990 Pressure ulcer of unspecified site, unspecified stage: Secondary | ICD-10-CM | POA: Insufficient documentation

## 2016-07-07 LAB — BASIC METABOLIC PANEL
Anion gap: 10 (ref 5–15)
BUN: 74 mg/dL — ABNORMAL HIGH (ref 6–20)
CHLORIDE: 110 mmol/L (ref 101–111)
CO2: 17 mmol/L — ABNORMAL LOW (ref 22–32)
Calcium: 8.2 mg/dL — ABNORMAL LOW (ref 8.9–10.3)
Creatinine, Ser: 2.09 mg/dL — ABNORMAL HIGH (ref 0.61–1.24)
GFR calc non Af Amer: 26 mL/min — ABNORMAL LOW (ref >60)
GFR, EST AFRICAN AMERICAN: 30 mL/min — AB (ref >60)
Glucose, Bld: 127 mg/dL — ABNORMAL HIGH (ref 65–99)
POTASSIUM: 4 mmol/L (ref 3.5–5.1)
Sodium: 137 mmol/L (ref 135–145)

## 2016-07-07 LAB — HEMOGLOBIN AND HEMATOCRIT, BLOOD
HEMATOCRIT: 27.1 % — AB (ref 39.0–52.0)
Hemoglobin: 8.8 g/dL — ABNORMAL LOW (ref 13.0–17.0)

## 2016-07-07 LAB — HEPARIN LEVEL (UNFRACTIONATED): Heparin Unfractionated: 0.56 IU/mL (ref 0.30–0.70)

## 2016-07-07 MED ORDER — SODIUM CHLORIDE 0.9 % IV SOLN
INTRAVENOUS | Status: DC
  Administered 2016-07-08 – 2016-07-09 (×2): via INTRAVENOUS

## 2016-07-07 NOTE — Care Management Important Message (Signed)
Important Message  Patient Details  Name: Luis Pierce MRN: [id number] Date of Birth: Apr 21, 1922   Medicare Important Message Given:  Yes    [name] Abena 07/07/2016, 10:07 AM

## 2016-07-07 NOTE — Evaluation (Signed)
Occupational Therapy Evaluation Patient Details Name: Luis Pierce MRN: [id number] DOB: 05/20/1922 Today's Date: 07/07/2016    History of Present Illness Pt adm with acute PE and no movement in LE's. Work up in progress. 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 MRI cervical spine-Abnormal thoracic spinal cord signal partially. 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 Pt also with UTI, acute kidney injury, and low B12. PMH - glaucoma with very poor vision, colon CA, prostate CA,    Clinical Impression   Pt was independent in self care and walked in his home without a device prior to admission. Pt has low vision due to glaucoma at baseline. He presents with flaccidity in his B LEs, poor trunk control, generalized weakness and impaired cognition. He demonstrated orthostatic hypotension, but was asymptomatic when seated EOB, ace wrap used prior to lift to chair. Pt is able to participate in grooming activities and self feeding. He requires extensive assist for bathing, dressing and toileting. Pt will need extensive rehab upon discharge.    Follow Up Recommendations  CIR    Equipment Recommendations  Wheelchair (measurements OT);Wheelchair cushion (measurements OT);Hospital bed    Recommendations for Other Services       Precautions / Restrictions Precautions Precautions: Fall Precaution Comments: watch BP; BLE wraps Restrictions Weight Bearing Restrictions: No      Mobility Bed Mobility Overal bed mobility: Needs Assistance Bed Mobility: Rolling;Sidelying to Sit;Sit to Sidelying Rolling: Max assist;+2 for physical assistance Sidelying to sit: Max assist;+2 for physical assistance     Sit to sidelying: +2 for safety/equipment;Max assist General bed mobility comments: Able to roll to left/right to position lift pad with Max A of 2 and cues to reach for rail. Able to use rail and assist with pulling self up but required assist with LEs and to elevate trunk.    Transfers Overall transfer level: Needs assistance               General transfer comment: Total A to transfer to chair using maximove. Bp stable.    Balance Overall balance assessment: Needs assistance Sitting-balance support: Feet supported;Single extremity supported Sitting balance-Leahy Scale: Zero Sitting balance - Comments: Requires Max A for sitting EOB, no balance reactions noted. LOB posteriorly without support. Sat EOB ~15 mins. Postural control: Posterior lean;Right lateral lean;Left lateral lean                                 ADL either performed or assessed with clinical judgement   ADL                                         General ADL Comments: Pt can self feed with set up and participate in grooming, dependent in bathing, dressing, toileting.     Vision Baseline Vision/History: Glaucoma;Legally blind Patient Visual Report: No change from baseline       Perception     Praxis      Pertinent Vitals/Pain Pain Assessment: No/denies pain Faces Pain Scale: No hurt     Hand Dominance Right   Extremity/Trunk Assessment Upper Extremity Assessment Upper Extremity Assessment: Generalized weakness   Lower Extremity Assessment RLE Deficits / Details: no active movement, PROM is WFL, flaccid B, impaired sensation RLE Sensation: decreased light touch;decreased proprioception LLE Deficits / Details: no active movement, PROM is WFL LLE Sensation: decreased light touch;decreased  proprioception       Communication Communication Communication: HOH   Cognition Arousal/Alertness: Awake/alert (falls asleep when not stimulated) Behavior During Therapy: WFL for tasks assessed/performed Overall Cognitive Status: Impaired/Different from baseline Area of Impairment: Attention;Following commands;Problem solving;Orientation                 Orientation Level: Time (month) Current Attention Level: Sustained   Following  Commands: Follows one step commands with increased time;Follows one step commands inconsistently     Problem Solving: Slow processing;Requires verbal cues General Comments: A&Ox3. Requires constant stimulus to stay awake. Pleasant.    General Comments       Exercises     Shoulder Instructions      Home Living Family/patient expects to be discharged to:: Private residence Living Arrangements: Spouse/significant other (spouse uses walker) Available Help at Discharge: Family;Available 24 hours/day (wife cannot physically assist) Type of Home: House       Home Layout: Two level;Laundry or work area in basement (stair lift to basement) Alternate Therapist, sports of Steps: flight                        Prior Functioning/Environment Level of Independence: Independent        Comments: does not drive, wife does meal prep, has housekeeper, pt does not walk with device inside home        OT Problem List: Decreased strength;Decreased activity tolerance;Impaired balance (sitting and/or standing);Impaired vision/perception;Decreased coordination;Decreased cognition;Decreased knowledge of use of DME or AE;Impaired sensation      OT Treatment/Interventions: Self-care/ADL training;Therapeutic activities;Therapeutic exercise;Patient/family education;Balance training;Cognitive remediation/compensation    OT Goals(Current goals can be found in the care plan section) Acute Rehab OT Goals Patient Stated Goal: return home OT Goal Formulation: With patient Time For Goal Achievement: 07/21/16 Potential to Achieve Goals: Fair ADL Goals Pt/caregiver will Perform Home Exercise Program: Increased strength;Both right and left upper extremity;With theraband;With minimal assist Additional ADL Goal #1: Pt will roll with max assistance to assist with pressure relief and ADL. Additional ADL Goal #2: Pt will sit EOB with B UE support and moderate assistance. Additional ADL Goal #3: Pt will  up in chair x 3 hours. Additional ADL Goal #4: Pt will perform pressure relief in sitting with moderate assist.  OT Frequency: Min 2X/week   Barriers to D/C: Decreased caregiver support          Co-evaluation PT/OT/SLP Co-Evaluation/Treatment: Yes Reason for Co-Treatment: For patient/therapist safety PT goals addressed during session: Mobility/safety with mobility;Balance OT goals addressed during session: Strengthening/ROM      AM-PAC PT "6 Clicks" Daily Activity     Outcome Measure Help from another person eating meals?: A Little Help from another person taking care of personal grooming?: A Little Help from another person toileting, which includes using toliet, bedpan, or urinal?: Total Help from another person bathing (including washing, rinsing, drying)?: Total Help from another person to put on and taking off regular upper body clothing?: A Lot Help from another person to put on and taking off regular lower body clothing?: Total 6 Click Score: 11   End of Session Nurse Communication: Need for lift equipment (RN to order geomat and air mattress)  Activity Tolerance: Patient tolerated treatment well (BP monitored throughout) Patient left: in chair;with call bell/phone within reach;with bed alarm set;with family/visitor present  OT Visit Diagnosis: Muscle weakness (generalized) (M62.81);History of falling (Z91.81);Other symptoms and signs involving cognitive function;Low vision, both eyes (H54.2)  Time: 7159-5396 OT Time Calculation (min): 38 min Charges:  OT General Charges $OT Visit: 1 Procedure OT Evaluation $OT Eval Moderate Complexity: 1 Procedure G-Codes:      Malka So 07/07/2016, 2:14 PM  [phone]

## 2016-07-07 NOTE — Progress Notes (Signed)
Physical Therapy Treatment Patient Details Name: Luis Pierce MRN: [id number] DOB: [birth year] Today's Date: 07/07/2016    History of Present Illness Pt adm with acute PE and no movement in LE's. Work up in progress. 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 MRI cervical spine-Abnormal thoracic spinal cord signal partially. 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 Pt also with UTI, acute kidney injury, and low B12. PMH - glaucoma with very poor vision, colon CA, prostate CA,     PT Comments    Patient progressing well towards PT goals. Session performed with OT for EOB tolerance and transfer to chair. Tolerated sitting EOB ~15 mins with Max A for truncal support. BP initially drops with change in position but tolerated it better with wrapping ace wraps on BLEs for upright tolerance. Supine BP 130/58 Sitting BP 112/65 Sitting BP after 3 mins 93/52 BP in chair post wrapping LEs 114/54 Pt asymptomatic throughout all transitions. Pt presents with decreased sensation and muscle strength below T4-5. Tolerated transfer to chair using maximove. Pt pleasant and cooperative with therapy. Will continue to follow.  Follow Up Recommendations  CIR     Equipment Recommendations  Other (comment) (TBA)    Recommendations for Other Services       Precautions / Restrictions Precautions Precautions: Fall Precaution Comments: watch BP; BLE wraps Restrictions Weight Bearing Restrictions: No    Mobility  Bed Mobility Overal bed mobility: Needs Assistance Bed Mobility: Rolling;Sidelying to Sit Rolling: Max assist;+2 for physical assistance Sidelying to sit: Max assist;+2 for physical assistance       General bed mobility comments: Able to roll to left/right to position lift pad with Max A of 2 and cues to reach for rail. Able to use rail and assist with pulling self up but required assist with LEs and to elevate trunk.   Transfers Overall transfer level: Needs assistance                General transfer comment: Total A to transfer to chair using maximove. Bp stable.  Ambulation/Gait                 Stairs            Wheelchair Mobility    Modified Rankin (Stroke Patients Only) Modified Rankin (Stroke Patients Only) Pre-Morbid Rankin Score: Moderate disability Modified Rankin: Severe disability     Balance Overall balance assessment: Needs assistance Sitting-balance support: Feet supported;Single extremity supported Sitting balance-Leahy Scale: Zero Sitting balance - Comments: Requires Max A for sitting EOB, no balance reactions noted. LOB posteriorly without support. Sat EOB ~15 mins. Postural control: Posterior lean;Right lateral lean;Left lateral lean                                  Cognition Arousal/Alertness: Awake/alert (falls asleep when not stimulated) Behavior During Therapy: WFL for tasks assessed/performed Overall Cognitive Status: Impaired/Different from baseline Area of Impairment: Attention;Following commands;Problem solving;Orientation                   Current Attention Level: Sustained   Following Commands: Follows one step commands with increased time;Follows one step commands inconsistently     Problem Solving: Slow processing;Requires verbal cues General Comments: A&Ox3. Requires constant stimulus to stay awake. Pleasant.       Exercises      General Comments        Pertinent Vitals/Pain Pain Assessment: Faces Faces Pain Scale: No hurt  Home Living                      Prior Function            PT Goals (current goals can now be found in the care plan section) Progress towards PT goals: Progressing toward goals    Frequency    Min 3X/week      PT Plan Current plan remains appropriate    Co-evaluation PT/OT/SLP Co-Evaluation/Treatment: Yes Reason for Co-Treatment: For patient/therapist safety;To address functional/ADL transfers;Complexity of the patient's  impairments (multi-system involvement) PT goals addressed during session: Mobility/safety with mobility;Balance        AM-PAC PT "6 Clicks" Daily Activity  Outcome Measure  Difficulty turning over in bed (including adjusting bedclothes, sheets and blankets)?: Total Difficulty moving from lying on back to sitting on the side of the bed? : Total Difficulty sitting down on and standing up from a chair with arms (e.g., wheelchair, bedside commode, etc,.)?: Total Help needed moving to and from a bed to chair (including a wheelchair)?: Total Help needed walking in hospital room?: Total Help needed climbing 3-5 steps with a railing? : Total 6 Click Score: 6    End of Session Equipment Utilized During Treatment: Other (comment) (wraps BLEs) Activity Tolerance: Patient tolerated treatment well;Treatment limited secondary to medical complications (Comment) (low BP initially) Patient left: in chair;with call bell/phone within reach;with chair alarm set;with family/visitor present Nurse Communication: Mobility status;Need for lift equipment PT Visit Diagnosis: Muscle weakness (generalized) (M62.81);Difficulty in walking, not elsewhere classified (R26.2)     Time: 7681-1572 PT Time Calculation (min) (ACUTE ONLY): 38 min  Charges:  $Therapeutic Activity: 23-37 mins                    G Codes:       Wray Kearns, PT, DPT [phone]     Marguarite Arbour A Chandlar Staebell 07/07/2016, 12:17 PM

## 2016-07-07 NOTE — Progress Notes (Signed)
PROGRESS NOTE    Luis Pierce  BPZ:[id number] DOB: 1922-12-27 DOA: 07/03/2016 PCP: Lajean Manes, MD    Brief Narrative: 83 y/o male with PMH of HTN, CAD h/o CABG, Glaucoma, retinal detachment with poor vision, h/o colon, prostate cancer, urinary incontinence s/p artifical sphincter presented with generalized weakness, midline back pains and inability to walk. He was found to have acute PE and right  lower extremity DVT ,probably provoked from recent trip. He was started on IV heparin. Meanwhile neurology consulted for his generalized weakness and unable to ambulate, MRI of the brain and C spine obtained showing a small left temporal lobe infarct. MRI of the C  SPINE shows mod to severe spinal stenosis. Awaiting further recommendations from neurology,.  Meanwhile PT eval recommended CIR, awaiting insurance authorization .    Assessment & Plan:   Principal Problem:   Bilateral leg weakness Active Problems:   Pulmonary embolism (HCC)   Generalized weakness   Hypertension   Hyperlipidemia   Gross hematuria   Hypothermia   Dehydration   [name]   Urinary incontinence, male, stress   Pulmonary embolism on right [facility])   Thoracic back pain   Acute lower UTI   B12 deficiency   ARF (acute renal failure) (HCC)   Leg weakness   Cerebral embolism with cerebral infarction   Pressure injury of skin   Bilateral leg weakness:  Possibly from stroke, PE, DVT, and possibly spinal infarction and vitamin b12 deficiency.  Get bubble study for evaluation of the PFOand possibly GET MRI of the thoracic spine with contrast once his renal function improves.  PT/OT evaluation, recommending CIR.    Acute PE/ dvt:  No right ventricular strain.  On IV heparin.    Gross Hematuria:  H/o stress urinary incontinence s/p artifical urinary sphincter placement.  H/o prostate cancer s/p prostatectomy. Urology consulted and foley catheter placed and the AUS deactivated.  Currently on  CBI, US pelvis on 6/19 shows persistent clots.  He will probably need outpatient cystoscopy.  Keep the foley in place.     Acute blood loss anemia:  From hematuria.  Baseline hemoglobin around 14 and dropped to 9.5.  Repeat H&H ordered this afternoon. Transfuse to keep hemoglobin greater than 8.     Hypertension; well controlled.    Acute renal failure:  ? Pre renal azotemia vs NSAID use,  Slowly improving. US renal neg for hydronephrosis.  Nephrology consulted with recommendations.        DVT prophylaxis: heparin.  Code Status: full code.  Family Communication:discussed with daughter at bedside.  Disposition Plan: pending further evaluation.    Consultants:   Neurology  Urology     Procedures: bubble study to be scheduled.    Antimicrobials: none.    Subjective: ROS:  No chest pain or sob.  No back pain or cramps.  No headache or dizziness.  No nausea, vomiting.  Wants to know when he can be discharged.   Objective: Vitals:   07/06/16 1322 07/06/16 2223 07/07/16 0500 07/07/16 0622  BP: 110/65 (!) 103/59  (!) 121/54  Pulse: 90 65  91  Resp: 17 18  18   Temp: 98.5 F (36.9 C) 97.5 F (36.4 C)  97.8 F (36.6 C)  TempSrc: Axillary Oral  Oral  SpO2: 91% 100%  100%  Weight:   86 kg (189 lb 9.6 oz)   Height:        Intake/Output Summary (Last 24 hours) at 07/07/16 1239 Last data filed at 07/07/16  1914  Gross per 24 hour  Intake              848 ml  Output             1750 ml  Net             -902 ml   Filed Weights   07/05/16 0420 07/06/16 0547 07/07/16 0500  Weight: 80.9 kg (178 lb 6.4 oz) 84.2 kg (185 lb 11.2 oz) 86 kg (189 lb 9.6 oz)    Examination:  General exam: Appears calm and comfortable  Respiratory system: Clear to auscultation. Respiratory effort normal. Cardiovascular system: S1 & S2 heard, RRR. No JVD, murmurs, rubs, gallops or clicks.  Gastrointestinal system: Abdomen is nondistended, soft and nontender. No organomegaly  or masses felt. Normal bowel sounds heard. Central nervous system: Alert and oriented. Able to move both extremities, but couldn't lift them against resistance.  Extremities: no pedal edema.  Skin: No rashes, lesions or ulcers Psychiatry: mood normal.  Genitourinary:  CBI running and urine is dark.     Data Reviewed: I have personally reviewed following labs and imaging studies  CBC:  Recent Labs Lab 07/03/16 0819 07/04/16 0952 07/04/16 0958 07/05/16 0528 07/06/16 0321  WBC 12.6*  --  14.1* 15.4* 14.3*  NEUTROABS 11.0*  --  11.7*  --   --   HGB 14.4  --  13.8 11.5* 9.5*  HCT 42.8 40.3 40.2 34.5* 28.9*  MCV 88.6  --  88.9 90.8 89.5  PLT 130*  --  123* 159 782   Basic Metabolic Panel:  Recent Labs Lab 07/03/16 0819 07/04/16 0952 07/05/16 0528 07/06/16 0321 07/07/16 0337  NA 138 139 135 137 137  K 3.9 4.2 4.3 4.3 4.0  CL 109 109 107 110 110  CO2 22 19* 18* 19* 17*  GLUCOSE 128* 103* 121* 110* 127*  BUN 24* 34* 48* 66* 74*  CREATININE 1.06 2.24* 2.38* 2.61* 2.09*  CALCIUM 8.9 8.8* 8.4* 7.9* 8.2*   GFR: Estimated Creatinine Clearance: 23.8 mL/min (A) (by C-G formula based on SCr of 2.09 mg/dL (H)). Liver Function Tests:  Recent Labs Lab 07/03/16 0819 07/03/16 1803 07/04/16 0952  AST 32  --  27  ALT 17  --  16*  ALKPHOS 191*  --  187*  BILITOT 1.8* 1.4* 0.8  PROT 5.9*  --  5.4*  ALBUMIN 3.1*  --  2.8*   No results for input(s): LIPASE, AMYLASE in the last 168 hours. No results for input(s): AMMONIA in the last 168 hours. Coagulation Profile:  Recent Labs Lab 07/04/16 0952  INR 1.48   Cardiac Enzymes: No results for input(s): CKTOTAL, CKMB, CKMBINDEX, TROPONINI in the last 168 hours. BNP (last 3 results) No results for input(s): PROBNP in the last 8760 hours. HbA1C: No results for input(s): HGBA1C in the last 72 hours. CBG:  Recent Labs Lab 07/03/16 1516  GLUCAP 118*   Lipid Profile: No results for input(s): CHOL, HDL, LDLCALC, TRIG,  CHOLHDL, LDLDIRECT in the last 72 hours. Thyroid Function Tests: No results for input(s): TSH, T4TOTAL, FREET4, T3FREE, THYROIDAB in the last 72 hours. Anemia Panel: No results for input(s): VITAMINB12, FOLATE, FERRITIN, TIBC, IRON, RETICCTPCT in the last 72 hours. Sepsis Labs: No results for input(s): PROCALCITON, LATICACIDVEN in the last 168 hours.  Recent Results (from the past 240 hour(s))  Urine culture     Status: Abnormal   Collection Time: 07/03/16  3:16 PM  Result Value Ref Range Status  Specimen Description URINE, RANDOM  Final   Special Requests NONE  Final   Culture <10,000 COLONIES/mL INSIGNIFICANT GROWTH (A)  Final   Report Status 07/04/2016 FINAL  Final  Culture, blood (Routine X 2) w Reflex to ID Panel     Status: None (Preliminary result)   Collection Time: 07/03/16  6:08 PM  Result Value Ref Range Status   Specimen Description BLOOD LEFT HAND  Final   Special Requests IN PEDIATRIC BOTTLE Blood Culture adequate volume  Final   Culture NO GROWTH 3 DAYS  Final   Report Status PENDING  Incomplete  Culture, blood (Routine X 2) w Reflex to ID Panel     Status: None (Preliminary result)   Collection Time: 07/03/16  6:13 PM  Result Value Ref Range Status   Specimen Description BLOOD RIGHT ANTECUBITAL  Final   Special Requests IN PEDIATRIC BOTTLE Blood Culture adequate volume  Final   Culture NO GROWTH 3 DAYS  Final   Report Status PENDING  Incomplete         Radiology Studies: Ct Head Wo Contrast  Result Date: 07/05/2016 CLINICAL DATA:  Altered mental status and slurred speech. Agitation, new confusion, in a patient with anticoagulation. Acute lower extremity weakness concerning for cord infarct. EXAM: CT HEAD WITHOUT CONTRAST TECHNIQUE: Contiguous axial images were obtained from the base of the skull through the vertex without intravenous contrast. COMPARISON:  07/03/2016. FINDINGS: Motion degradation due to snoring. Brain: No evidence for acute infarction,  hemorrhage, mass lesion, hydrocephalus, or extra-axial fluid. Generalized atrophy. Chronic microvascular ischemic change. Vascular: No signs of proximal large vessel occlusion. Advanced atherosclerotic calcification of the skull base internal carotid arteries and distal vertebral arteries. Skull: Normal. Negative for fracture or focal lesion. Sinuses/Orbits: No layering fluid in the sinuses. Unusual appearing banding devices are noted, surrounding both globes. The band on the RIGHT is partially metallic. The band on the LEFT is low attenuation, near fat density. BILATERAL cataract extraction. Other: None. Compared with 07/03/2016, similar appearance. IMPRESSION: In this patient who is anticoagulated, with altered mental status, there are no acute areas of parenchymal or subarachnoid hemorrhage. Stable appearing atrophy with extensive white matter disease. BILATERAL ocular banding procedures, as discussed above. Electronically Signed   By: Staci Righter M.D.   On: 07/05/2016 13:39   Mr Brain Wo Contrast  Result Date: 07/06/2016 CLINICAL DATA:  Acute lower extremity weakness, LEFT back pain and falls. History of prostate cancer, melanoma. EXAM: MRI HEAD WITHOUT CONTRAST MRI CERVICAL SPINE WITHOUT CONTRAST TECHNIQUE: Multiplanar, multiecho pulse sequences of the brain and surrounding structures, and cervical spine, to include the craniocervical junction and cervicothoracic junction, were obtained without intravenous contrast. Axial T2 sequence through cervical spine not obtained, patient was unable to tolerate further imaging. MRI thoracic spine performed at this time. COMPARISON:  CT HEAD July 05, 2016 FINDINGS: MRI HEAD FINDINGS- mildly motion degraded examination. BRAIN: Focal reduced diffusion LEFT temporal lobe/ hippocampus with low ADC values. No susceptibility artifact to suggest hemorrhage. The ventricles and sulci are normal for patient's age. Scattered subcentimeter supratentorial white matter FLAIR T2  hyperintensities are less than expected for age compatible with mild chronic small vessel ischemic disease. Old bilateral basal ganglia lacunar infarcts. No suspicious parenchymal signal, masses or mass effect. No abnormal extra-axial fluid collections. VASCULAR: Normal major intracranial vascular flow voids present at skull base. SKULL AND UPPER CERVICAL SPINE: No abnormal sellar expansion. No suspicious calvarial bone marrow signal. Craniocervical junction maintained. SINUSES/ORBITS: The mastoid air-cells and included paranasal sinuses  are well-aerated. The included ocular globes and orbital contents are non-suspicious. Status post bilateral ocular lens implants and scleral banding. OTHER: None. MRI CERVICAL SPINE FINDINGS- moderate to severely motion degraded sagittal STIR and axial T2 merge ALIGNMENT: Straightened cervical lordosis.  No malalignment. VERTEBRAE/DISCS: Vertebral bodies are intact. Moderate C4-5 through C6-7 disc height loss with proportional subacute on chronic discogenic endplate changes. Disc desiccation all cervical levels. No suspicious or acute bone marrow signal. CORD:Mildly expansile T2 bright signal at T2, incompletely evaluated. No abnormal cervical spine cord signal or syrinx. POSTERIOR FOSSA, VERTEBRAL ARTERIES, PARASPINAL TISSUES: Mild bright interstitial STIR signal upper thoracic paraspinal soft tissues. Limited overall assessment. DISC LEVELS: Limited assessment due to motion. Moderate to severe canal stenosis C4-5 due to broad-based disc bulge, moderate at C3-4. Nondiagnostic evaluation of the neural foramen. IMPRESSION: MRI HEAD: Mildly motion degraded examination. Acute small LEFT temporal lobe/ hippocampal infarct, less likely infectious etiology. Old basal ganglia lacunar infarcts and mild chronic small vessel ischemic disease. MRI CERVICAL SPINE: Abnormal thoracic spinal cord signal partially imaged at T2. Mild suspected paraspinal muscle strain thoracic spine. Recommend MRI  thoracic spine with and without contrast when patient is able to tolerate further imaging. Limited four sequence MRI, motion degraded. Moderate to severe canal stenosis C4-5 suspected, moderate at C3-4. Nondiagnostic assessment of the neural foramen. Electronically Signed   By: Elon Alas M.D.   On: 07/06/2016 19:33   Mr Cervical Spine Wo Contrast  Result Date: 07/06/2016 CLINICAL DATA:  Acute lower extremity weakness, LEFT back pain and falls. History of prostate cancer, melanoma. EXAM: MRI HEAD WITHOUT CONTRAST MRI CERVICAL SPINE WITHOUT CONTRAST TECHNIQUE: Multiplanar, multiecho pulse sequences of the brain and surrounding structures, and cervical spine, to include the craniocervical junction and cervicothoracic junction, were obtained without intravenous contrast. Axial T2 sequence through cervical spine not obtained, patient was unable to tolerate further imaging. MRI thoracic spine performed at this time. COMPARISON:  CT HEAD July 05, 2016 FINDINGS: MRI HEAD FINDINGS- mildly motion degraded examination. BRAIN: Focal reduced diffusion LEFT temporal lobe/ hippocampus with low ADC values. No susceptibility artifact to suggest hemorrhage. The ventricles and sulci are normal for patient's age. Scattered subcentimeter supratentorial white matter FLAIR T2 hyperintensities are less than expected for age compatible with mild chronic small vessel ischemic disease. Old bilateral basal ganglia lacunar infarcts. No suspicious parenchymal signal, masses or mass effect. No abnormal extra-axial fluid collections. VASCULAR: Normal major intracranial vascular flow voids present at skull base. SKULL AND UPPER CERVICAL SPINE: No abnormal sellar expansion. No suspicious calvarial bone marrow signal. Craniocervical junction maintained. SINUSES/ORBITS: The mastoid air-cells and included paranasal sinuses are well-aerated. The included ocular globes and orbital contents are non-suspicious. Status post bilateral ocular lens  implants and scleral banding. OTHER: None. MRI CERVICAL SPINE FINDINGS- moderate to severely motion degraded sagittal STIR and axial T2 merge ALIGNMENT: Straightened cervical lordosis.  No malalignment. VERTEBRAE/DISCS: Vertebral bodies are intact. Moderate C4-5 through C6-7 disc height loss with proportional subacute on chronic discogenic endplate changes. Disc desiccation all cervical levels. No suspicious or acute bone marrow signal. CORD:Mildly expansile T2 bright signal at T2, incompletely evaluated. No abnormal cervical spine cord signal or syrinx. POSTERIOR FOSSA, VERTEBRAL ARTERIES, PARASPINAL TISSUES: Mild bright interstitial STIR signal upper thoracic paraspinal soft tissues. Limited overall assessment. DISC LEVELS: Limited assessment due to motion. Moderate to severe canal stenosis C4-5 due to broad-based disc bulge, moderate at C3-4. Nondiagnostic evaluation of the neural foramen. IMPRESSION: MRI HEAD: Mildly motion degraded examination. Acute small LEFT temporal lobe/  hippocampal infarct, less likely infectious etiology. Old basal ganglia lacunar infarcts and mild chronic small vessel ischemic disease. MRI CERVICAL SPINE: Abnormal thoracic spinal cord signal partially imaged at T2. Mild suspected paraspinal muscle strain thoracic spine. Recommend MRI thoracic spine with and without contrast when patient is able to tolerate further imaging. Limited four sequence MRI, motion degraded. Moderate to severe canal stenosis C4-5 suspected, moderate at C3-4. Nondiagnostic assessment of the neural foramen. Electronically Signed   By: Elon Alas M.D.   On: 07/06/2016 19:33   US Pelvis Limited  Result Date: 07/06/2016 CLINICAL DATA:  Hematuria.  Concern for blood clot or bladder mass. EXAM: LIMITED ULTRASOUND OF PELVIS TECHNIQUE: Limited transabdominal ultrasound examination of the pelvis was performed. COMPARISON:  CT 01/26/2016 FINDINGS: Urinary bladder is minimally distended. There is some  heterogeneous intraluminal debris. Hyperdensity dependently measures 1.6 x 0.8 x 2.0 cm with no definite posterior shadowing. IMPRESSION: Minimally distended bladder with some intraluminal debris. Dependent hyperdensity measuring 1.6 x 0.8 x 2.0 cm has no definite posterior shadowing. This may reflect small stones or clot. Electronically Signed   By: Jeb Levering M.D.   On: 07/06/2016 04:28        Scheduled Meds: . atorvastatin  40 mg Oral q1800  . cyanocobalamin  1,000 mcg Intramuscular Daily  . dorzolamide-timolol  1 drop Right Eye BID  . fentaNYL (SUBLIMAZE) injection  50 mcg Intravenous Once  . latanoprost  1 drop Right Eye QHS  . mouth rinse  15 mL Mouth Rinse q12n4p   Continuous Infusions: . heparin 800 Units/hr (07/06/16 2116)  . sodium chloride irrigation       LOS: 4 days    Time spent: 45 minutes.     Hosie Poisson, MD [facility] Pager [phone]   If 7PM-7AM, please contact night-coverage [email or web address] Password [facility] 07/07/2016, 12:39 PM

## 2016-07-07 NOTE — Progress Notes (Signed)
I met with pt's wife and daughter at bedside to discuss a possible inpt rehab admit pending insurance approval when pt medically ready. They both prefer an inpt rehab stay and hopeful for home at wheelchair level with hired caregivers and family support. I will begin insurance authorization. Please clarify when pt felt medically ready for d/c. [phone]

## 2016-07-07 NOTE — Progress Notes (Signed)
[facility] Progress Note    Assessment/ Plan:   1.  Acute kidney injury superimposed on chronic kidney disease: I suspect CKD secondary to HTN.  Renal US with cortical thinning.  I don't think a large diagnostic workup is necessary.  UP/C won't be accurate because of CBI.  AKI likely due to relative hypotension in setting of acute illness and some NSAID use.  [place] doesn't indicate renal infarct.  Creatinine is downtrending now, urine output reflects a mild post-ATN diuresis.  No need for restarting fluids now.  Avoid IV contrast, fleets enemas, NSAIDs, mag citrate, PPIs.   2.  Bilateral LE weakness: ? suspected spinal cord infarct, need to repeat thoracic spine MRI  3.  DVT/PE: on heparin gtt; in setting of AKI don't recommend NOAC at this time --> if creatinine returns back to baseline, we could reconsider dose-reduced Eliquis (2.5 mg BID) but note that eGFR will not reflect true CrCl at extremes of age  27.  Gross hematuria: Likely due to known bladder lesion, don't suspect this is related to GN.  Per urology.  CBI stopped.  5.  HTN: hold BP meds in setting of soft BP s which are now trending more toward the normotensive side.  6.  CVA: L hippocampal infarct.  TCDs with bubbles pending.  Subjective:    Had MRI brain yesterday showing a Left hippocampal infarct, C4-5 disc bulge.  Thoracic spine degraded by artifact but it was noted that there is a T2 bright signal at T2, incompletely imaged.   Pt in chair surrounded by family today.  Falls asleep in chair.     Objective:   BP (!) 121/54 (BP Location: Right Arm)   Pulse 91   Temp 97.8 F (36.6 C) (Oral)   Resp 18   Ht '5\' 10"'$  (1.778 m)   Wt 86 kg (189 lb 9.6 oz)   SpO2 100%   BMI 27.20 kg/m   Intake/Output Summary (Last 24 hours) at 07/07/16 1341 Last data filed at 07/07/16 0944  Gross per 24 hour  Intake              590 ml  Output             1050 ml  Net             -460 ml   Weight change: 1.769 kg (3  lb 14.4 oz)  Physical Exam:  GEN elderly, NAD HEENT eyes closed, occasionally opens them NECK no JVD PULM normal WOB, improved from yesterday CV III/VI systolic murmur no r/g ABD nontender, nondistended, well-healed infra-umbilical surgical scar and in the RLQ.  No hepatosplenomegaly. EXT no LE edema NEURO bilateral leg weakness. SKIN + small hematoma L antecubital space MSK no effusions  Imaging: Mr Brain Wo Contrast  Result Date: 07/06/2016 CLINICAL DATA:  Acute lower extremity weakness, LEFT back pain and falls. History of prostate cancer, melanoma. EXAM: MRI HEAD WITHOUT CONTRAST MRI CERVICAL SPINE WITHOUT CONTRAST TECHNIQUE: Multiplanar, multiecho pulse sequences of the brain and surrounding structures, and cervical spine, to include the craniocervical junction and cervicothoracic junction, were obtained without intravenous contrast. Axial T2 sequence through cervical spine not obtained, patient was unable to tolerate further imaging. MRI thoracic spine performed at this time. COMPARISON:  CT HEAD July 05, 2016 FINDINGS: MRI HEAD FINDINGS- mildly motion degraded examination. BRAIN: Focal reduced diffusion LEFT temporal lobe/ hippocampus with low ADC values. No susceptibility artifact to suggest hemorrhage. The ventricles and sulci are normal for patient's age.  Scattered subcentimeter supratentorial white matter FLAIR T2 hyperintensities are less than expected for age compatible with mild chronic small vessel ischemic disease. Old bilateral basal ganglia lacunar infarcts. No suspicious parenchymal signal, masses or mass effect. No abnormal extra-axial fluid collections. VASCULAR: Normal major intracranial vascular flow voids present at skull base. SKULL AND UPPER CERVICAL SPINE: No abnormal sellar expansion. No suspicious calvarial bone marrow signal. Craniocervical junction maintained. SINUSES/ORBITS: The mastoid air-cells and included paranasal sinuses are well-aerated. The included ocular  globes and orbital contents are non-suspicious. Status post bilateral ocular lens implants and scleral banding. OTHER: None. MRI CERVICAL SPINE FINDINGS- moderate to severely motion degraded sagittal STIR and axial T2 merge ALIGNMENT: Straightened cervical lordosis.  No malalignment. VERTEBRAE/DISCS: Vertebral bodies are intact. Moderate C4-5 through C6-7 disc height loss with proportional subacute on chronic discogenic endplate changes. Disc desiccation all cervical levels. No suspicious or acute bone marrow signal. CORD:Mildly expansile T2 bright signal at T2, incompletely evaluated. No abnormal cervical spine cord signal or syrinx. POSTERIOR FOSSA, VERTEBRAL ARTERIES, PARASPINAL TISSUES: Mild bright interstitial STIR signal upper thoracic paraspinal soft tissues. Limited overall assessment. DISC LEVELS: Limited assessment due to motion. Moderate to severe canal stenosis C4-5 due to broad-based disc bulge, moderate at C3-4. Nondiagnostic evaluation of the neural foramen. IMPRESSION: MRI HEAD: Mildly motion degraded examination. Acute small LEFT temporal lobe/ hippocampal infarct, less likely infectious etiology. Old basal ganglia lacunar infarcts and mild chronic small vessel ischemic disease. MRI CERVICAL SPINE: Abnormal thoracic spinal cord signal partially imaged at T2. Mild suspected paraspinal muscle strain thoracic spine. Recommend MRI thoracic spine with and without contrast when patient is able to tolerate further imaging. Limited four sequence MRI, motion degraded. Moderate to severe canal stenosis C4-5 suspected, moderate at C3-4. Nondiagnostic assessment of the neural foramen. Electronically Signed   By: Awilda Metro M.D.   On: 07/06/2016 19:33   Mr Cervical Spine Wo Contrast  Result Date: 07/06/2016 CLINICAL DATA:  Acute lower extremity weakness, LEFT back pain and falls. History of prostate cancer, melanoma. EXAM: MRI HEAD WITHOUT CONTRAST MRI CERVICAL SPINE WITHOUT CONTRAST TECHNIQUE:  Multiplanar, multiecho pulse sequences of the brain and surrounding structures, and cervical spine, to include the craniocervical junction and cervicothoracic junction, were obtained without intravenous contrast. Axial T2 sequence through cervical spine not obtained, patient was unable to tolerate further imaging. MRI thoracic spine performed at this time. COMPARISON:  CT HEAD July 05, 2016 FINDINGS: MRI HEAD FINDINGS- mildly motion degraded examination. BRAIN: Focal reduced diffusion LEFT temporal lobe/ hippocampus with low ADC values. No susceptibility artifact to suggest hemorrhage. The ventricles and sulci are normal for patient's age. Scattered subcentimeter supratentorial white matter FLAIR T2 hyperintensities are less than expected for age compatible with mild chronic small vessel ischemic disease. Old bilateral basal ganglia lacunar infarcts. No suspicious parenchymal signal, masses or mass effect. No abnormal extra-axial fluid collections. VASCULAR: Normal major intracranial vascular flow voids present at skull base. SKULL AND UPPER CERVICAL SPINE: No abnormal sellar expansion. No suspicious calvarial bone marrow signal. Craniocervical junction maintained. SINUSES/ORBITS: The mastoid air-cells and included paranasal sinuses are well-aerated. The included ocular globes and orbital contents are non-suspicious. Status post bilateral ocular lens implants and scleral banding. OTHER: None. MRI CERVICAL SPINE FINDINGS- moderate to severely motion degraded sagittal STIR and axial T2 merge ALIGNMENT: Straightened cervical lordosis.  No malalignment. VERTEBRAE/DISCS: Vertebral bodies are intact. Moderate C4-5 through C6-7 disc height loss with proportional subacute on chronic discogenic endplate changes. Disc desiccation all cervical levels. No suspicious or acute  bone marrow signal. CORD:Mildly expansile T2 bright signal at T2, incompletely evaluated. No abnormal cervical spine cord signal or syrinx. POSTERIOR  FOSSA, VERTEBRAL ARTERIES, PARASPINAL TISSUES: Mild bright interstitial STIR signal upper thoracic paraspinal soft tissues. Limited overall assessment. DISC LEVELS: Limited assessment due to motion. Moderate to severe canal stenosis C4-5 due to broad-based disc bulge, moderate at C3-4. Nondiagnostic evaluation of the neural foramen. IMPRESSION: MRI HEAD: Mildly motion degraded examination. Acute small LEFT temporal lobe/ hippocampal infarct, less likely infectious etiology. Old basal ganglia lacunar infarcts and mild chronic small vessel ischemic disease. MRI CERVICAL SPINE: Abnormal thoracic spinal cord signal partially imaged at T2. Mild suspected paraspinal muscle strain thoracic spine. Recommend MRI thoracic spine with and without contrast when patient is able to tolerate further imaging. Limited four sequence MRI, motion degraded. Moderate to severe canal stenosis C4-5 suspected, moderate at C3-4. Nondiagnostic assessment of the neural foramen. Electronically Signed   By: Elon Alas M.D.   On: 07/06/2016 19:33   US Pelvis Limited  Result Date: 07/06/2016 CLINICAL DATA:  Hematuria.  Concern for blood clot or bladder mass. EXAM: LIMITED ULTRASOUND OF PELVIS TECHNIQUE: Limited transabdominal ultrasound examination of the pelvis was performed. COMPARISON:  CT 01/26/2016 FINDINGS: Urinary bladder is minimally distended. There is some heterogeneous intraluminal debris. Hyperdensity dependently measures 1.6 x 0.8 x 2.0 cm with no definite posterior shadowing. IMPRESSION: Minimally distended bladder with some intraluminal debris. Dependent hyperdensity measuring 1.6 x 0.8 x 2.0 cm has no definite posterior shadowing. This may reflect small stones or clot. Electronically Signed   By: Jeb Levering M.D.   On: 07/06/2016 04:28    Labs: BMET  Recent Labs Lab 07/03/16 0819 07/04/16 0952 07/05/16 0528 07/06/16 0321 07/07/16 0337  NA 138 139 135 137 137  K 3.9 4.2 4.3 4.3 4.0  CL 109 109 107 110  110  CO2 22 19* 18* 19* 17*  GLUCOSE 128* 103* 121* 110* 127*  BUN 24* 34* 48* 66* 74*  CREATININE 1.06 2.24* 2.38* 2.61* 2.09*  CALCIUM 8.9 8.8* 8.4* 7.9* 8.2*   CBC  Recent Labs Lab 07/03/16 0819  07/04/16 0958 07/05/16 0528 07/06/16 0321 07/07/16 1308  WBC 12.6*  --  14.1* 15.4* 14.3*  --   NEUTROABS 11.0*  --  11.7*  --   --   --   HGB 14.4  --  13.8 11.5* 9.5* 8.8*  HCT 42.8  < > 40.2 34.5* 28.9* 27.1*  MCV 88.6  --  88.9 90.8 89.5  --   PLT 130*  --  123* 159 178  --   < > = values in this interval not displayed.  Medications:    . atorvastatin  40 mg Oral q1800  . cyanocobalamin  1,000 mcg Intramuscular Daily  . dorzolamide-timolol  1 drop Right Eye BID  . fentaNYL (SUBLIMAZE) injection  50 mcg Intravenous Once  . latanoprost  1 drop Right Eye QHS  . mouth rinse  15 mL Mouth Rinse q12n4p      Madelon Lips, MD [facility] pgr [phone] 07/07/2016, 1:41 PM

## 2016-07-07 NOTE — Progress Notes (Signed)
STROKE TEAM PROGRESS NOTE   HISTORY OF PRESENT ILLNESS (per record) Luis Dessert. Speciale is an 83 y.o. male who presented via EMS from home for weakness first noticed at 0545 on awakening on 07/03/2016. The patient stated that he could not feel his legs and slid to the floor. He also felt that his speech was slurred during the episode. He was assisted to his bed by his daughter and had a brief moment of chest pain during the incident.He also had chest pain on the night of 07/02/2016. His LKN in the evening of 07/02/2016.  Per ED note, at the time of their evaluation he was still unable to lift legs off bed, but was able to move his left knee. Per patient his speech was at baseline and he was alert and oriented x 4. Of note, he has a history of glaucoma and is legally blind.   CT head: 1. No evidence for acute abnormality. 2. Atrophy and small vessel disease CT thoracic spine: Advanced degenerative changes involving the thoracic spine but no fracture or bone lesion. CT chest revealed a PE: 1. Right-sided pulmonary embolism without findings for right heart strain.2. Tortuosity, ectasia and atherosclerotic calcifications involving the thoracic aorta but no dissection. 3. Three-vessel coronary artery calcifications and surgical changes from coronary artery bypass surgery. 4. Bibasilar atelectasis but no definite infiltrates, edema or effusions. EKG: No atrial fibrillation  MRI initially held in order to clarify whether or not the patient's ocular prosthesis is safe for MRI.  Limited study due to motion artifact, with no MR thoracic spine images. Home medications include ASA and atorvastatin Patient was not administered IV t-PA secondary to arriving outside of the treatment window. He was admitted to General Neurology for further evaluation and treatment.   SUBJECTIVE (INTERVAL HISTORY) His daughter is at the bedside.  The patient is drowsy and mildly confused, but rouses to voice and follows commands.   He had  acute onset paraplegia concerning for spinal cord infarct, especially in the setting of DVT/PE and cerebral embolic infarcts. However, given his extensive and multiple cancer history, hypercoagulable state cannot be ruled out at this time. Also, neuro exam showed T4-T6 sensory levels with loss of vibration and the proprioception sensation, not consistent with ASA infarct.  Discussed with daughter, Dr. Tamala Julian, at bedside. Although further testing would not necessarily change management, however, it may provide helpful information for prognostication.    OBJECTIVE Temp:  [97.5 F (36.4 C)-98.5 F (36.9 C)] 97.8 F (36.6 C) (06/20 0622) Pulse Rate:  [65-91] 91 (06/20 0622) Cardiac Rhythm: Normal sinus rhythm (06/20 0700) Resp:  [17-18] 18 (06/20 0622) BP: (103-121)/(54-65) 121/54 (06/20 0622) SpO2:  [91 %-100 %] 100 % (06/20 0622) Weight:  [86 kg (189 lb 9.6 oz)] 86 kg (189 lb 9.6 oz) (06/20 0500)  CBC:  Recent Labs Lab 07/03/16 0819  07/04/16 0958 07/05/16 0528 07/06/16 0321  WBC 12.6*  --  14.1* 15.4* 14.3*  NEUTROABS 11.0*  --  11.7*  --   --   HGB 14.4  --  13.8 11.5* 9.5*  HCT 42.8  < > 40.2 34.5* 28.9*  MCV 88.6  --  88.9 90.8 89.5  PLT 130*  --  123* 159 178  < > = values in this interval not displayed.  Basic Metabolic Panel:  Recent Labs Lab 07/06/16 0321 07/07/16 0337  NA 137 137  K 4.3 4.0  CL 110 110  CO2 19* 17*  GLUCOSE 110* 127*  BUN 66* 74*  CREATININE 2.61* 2.09*  CALCIUM 7.9* 8.2*    Lipid Panel: No results found for: CHOL, TRIG, HDL, CHOLHDL, VLDL, LDLCALC HgbA1c: No results found for: HGBA1C Urine Drug Screen: No results found for: LABOPIA, COCAINSCRNUR, LABBENZ, AMPHETMU, THCU, LABBARB  Alcohol Level No results found for: [place] I have personally reviewed the radiological images below and agree with the radiology interpretations.  CT Angio Chest Aorta W and/or Wo Contrast 07/03/2016 IMPRESSION: 1. Right-sided pulmonary embolism without  findings for right heartstrain. 2. Tortuosity, ectasia and atherosclerotic calcifications involving the thoracic aorta but no dissection. 3. Three-vessel coronary artery calcifications and surgical changes from coronary artery bypass surgery. 4. Bibasilar atelectasis but no definite infiltrates, edema or effusions. Aortic Atherosclerosis (ICD10-I70.0).  Vas [place] Lower Extremity Venous Duplex 07/04/2016 Summary:- Findings consistent with acute deep vein thrombosis involving the right peroneal vein. - No evidence of deep vein thrombosis involving the left lower extremity.  2D Echo 07/04/2016 Impressions: - LVEF 60-65%, moderate LVH, normal wall motion, grade 1 DD with normal LV filling pressure, dilated aortic root and ascending aorta, normal LA size, trivial TR, RVSP 18 mmHg, normal IVC.  Ct Head Wo Contrast 07/05/2016 IMPRESSION: In this patient who is anticoagulated, with altered mental status, there are no acute areas of parenchymal or subarachnoid hemorrhage. Stable appearing atrophy with extensive white matter disease. BILATERAL ocular banding procedures, as discussed above.   Mr Brain 63 Contrast Mr Cervical Spine Wo Contrast 07/06/2016 IMPRESSION: MRI HEAD: Mildly motion degraded examination. Acute small LEFT temporal lobe/ hippocampal infarct, less likely infectious etiology. Old basal ganglia lacunar infarcts and mild chronic small vessel ischemic disease.  MRI CERVICAL SPINE: Abnormal thoracic spinal cord signal partially imaged at T2. Mild suspected paraspinal muscle strain thoracic spine. Recommend MRI thoracic spine with and without contrast when patient is able to tolerate further imaging. Limited four sequence MRI, motion degraded. Moderate to severe canal stenosis C4-5 suspected, moderate at C3-4. Nondiagnostic assessment of the neural foramen.  US Pelvis Limited 07/06/2016 IMPRESSION: Minimally distended bladder with some intraluminal debris. Dependent hyperdensity measuring  1.6 x 0.8 x 2.0 cm has no definite posterior shadowing. This may reflect small stones or clot.  Transcranial Doppler w Bubbles pending    PHYSICAL EXAM  Temp:  [97.5 F (36.4 C)-98 F (36.7 C)] 98 F (36.7 C) (06/20 1419) Pulse Rate:  [65-91] 90 (06/20 1419) Resp:  [17-18] 17 (06/20 1419) BP: (103-121)/(54-59) 119/57 (06/20 1419) SpO2:  [96 %-100 %] 96 % (06/20 1419) Weight:  [189 lb 9.6 oz (86 kg)] 189 lb 9.6 oz (86 kg) (06/20 0500)  General - Well nourished, well developed, drowsy sleepy but arousable.  Ophthalmologic - Fundi not visualized due to noncooperation.  Cardiovascular - Regular rate and rhythm.  Neuro - drowsy sleepy but arousable, orientated to place and people and year, however not orientated to month. Mild dysarthria, no aphasia or neglect. PERRL, left eye total blindness, right eye decreased visual acuity, no significant facial asymmetry, tongue in midline. Bilateral upper extremity equal strength with normal sensation. However, patient does have a T4-T6 sensory level. Bilateral lower extremity 0/5, hypotonia, with left positive Babinski and triple reflex. Right Babinski negative. Bilateral lower extremity diminished aberration and proprioception. Finger-to-nose grossly intact bilaterally. Gait not tested.   ASSESSMENT/PLAN Mr. GRIFF BADLEY is a 83 y.o. male with history of left-eye blindness, chronic kidney disease, kidney stones and urinary incontinence, colon cancer ([place]), coronary artery disease, diverticulosis, DJD (degenerative joint disease), glaucoma, hyperlipemia, hypertension, melanoma of back ([place]), mild  mitral regurgitation, prostate cancer ([place]), and urinary incontinence presenting with bilateral lower extremity weakness and numbness, slurred speech, and chest pain. He did not receive IV t-PA due to arriving outside of the treatment window.  Stroke: Acute small left temporal/hippocampal and left posterior frontal punctate infarcts, emboli pattern. DDx  includes: 1. paradoxical emboli due to right DVT.  However, PFO has to be present 2. Hypercoagulable state due to malignancy. Patient had multiple extensive cancer history, may need to do pan CT to rule out metastasis 3. cardioembolic - such as afib  CT head: no stroke  MRI head - Acute small LEFT temporal lobe/ hippocampal infarct as well as left posterior frontal infact  Carotid Doppler - not ordered as it will not change management.  2D Echo: EF 60-65%. No source of embolus.  TCD bubble study pending to evaluate PFO  Will consider pan-CT once pt Cre normalized  LDL pending  Heparin IV for VTE prophylaxis  Diet regular Room service appropriate? Yes; Fluid consistency: Thin  aspirin 81 mg daily prior to admission, now on heparin IV.  Patient counseled to be compliant with his antithrombotic medications  Ongoing aggressive stroke risk factor management  Therapy recommendations: CIR  Disposition:  pending  Paraplegia, acute onset, DDx including: 1. Spinal cord infarct - acute onset, profound weakness, MRI abnormal signal. However, pt does have decreased b/l vibration and proprioception, not consistent with ASA infarct. 2.  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   Will repeat MRI C-spine and do MRI T-spine with contrast once Cre normalized  DVT and PE  LE venous doppler confirmed right DVT  CTA chest confirmed right PE  On heparin drip  Hematuria   Gross hematuria  Still on heparin IV  On bladder irrigation   B12 deficiency  B12 level < 50  On B12 injection daily  Can not explain pt profound leg weakness  AKI   Cre 1.06->2.38->2.61->2.09  On IVF @ 75cc/h  Close monitoring  Extensive hx of cancer  Colon cancer s/p colectomy, stage I  Melanoma of the  back s/p resection  Prostate cancer s/p TRUP  ? Bladder cancer   Will consider pan CT to rule out metastasis once Cre normalized  Hyperlipidemia  Home meds: atorvastatin 40mg  PO daily, resumed in hospital  LDL pending, goal < 70  Continue statin at discharge  Other Stroke Risk Factors  Advanced age  ETOH use, advised to drink no more than 2 drink(s) a day  Coronary artery disease  Other Active Problems  Blind left eye    Urinary incontinence   Hospital day # 4   This patient is medically critical due to acute DVT/PE, paraplegia, hematuria on heparin IV. Patient is at high risk for bleeding, recurrent strokes and TIAs and neurological worsening. This patient's care requires constant monitoring of vital signs, hemodynamics, respiratory and cardiac monitoring, review of multiple databases, neurological assessment, discussion with family, other specialists and medical decision making of high complexity. I had long discussion with daughter Dr. Tamala Julian at bedside, updated pt current condition, treatment plan and potential prognosis.    Rosalin Hawking, MD PhD Stroke Neurology 07/07/2016 11:55 PM  To contact Stroke Continuity provider, please refer to [email or web address] After hours, contact General Neurology

## 2016-07-07 NOTE — Progress Notes (Signed)
ANTICOAGULATION CONSULT NOTE - Follow Up Consult  Pharmacy Consult for Heparin Indication: pulmonary embolus  No Known Allergies  Patient Measurements: Height: 5\' 10"  (177.8 cm) Weight: 189 lb 9.6 oz (86 kg) IBW/kg (Calculated) : 73 Heparin Dosing Weight:  81.7 kg  Vital Signs: Temp: 97.8 F (36.6 C) (06/20 0622) Temp Source: Oral (06/20 0622) BP: 121/54 (06/20 0622) Pulse Rate: 91 (06/20 0622)  Labs:  Recent Labs  07/04/16 4166  07/04/16 0958  07/05/16 0528 07/05/16 1535 07/06/16 0321 07/07/16 0337  HGB  --   < > 13.8  --  11.5*  --  9.5*  --   HCT 40.3  --  40.2  --  34.5*  --  28.9*  --   PLT  --   --  123*  --  159  --  178  --   LABPROT 18.1*  --   --   --   --   --   --   --   INR 1.48  --   --   --   --   --   --   --   HEPARINUNFRC 0.84*  --   --   < > 0.66 0.64 0.54 0.56  CREATININE 2.24*  --   --   --  2.38*  --  2.61* 2.09*  < > = values in this interval not displayed.  Estimated Creatinine Clearance: 23.8 mL/min (A) (by C-G formula based on SCr of 2.09 mg/dL (H)).  Assessment: 48 yom continuing on IV hep for acute PE. Hematuria reported this admit (now resolving) but ok to continue heparin for now per Urology recommendations. CT head negative for bleed.  Heparin level remains therapeutic at 0.56 on 800 units/h. Hg down 9.5, plt up 178. AKI - SCr trending back down today.  Goal of Therapy:  Heparin level 0.3-0.7 units/ml Monitor platelets by anticoagulation protocol: Yes   Plan:  Heparin at 800 units/hr Daily heparin level, CBC Monitor closely for increased s/sx bleeding   Elicia Lamp, PharmD, BCPS Clinical Pharmacist Rx Phone # for today: [phone] After 3:30PM, please call Main Rx: #[id number] 07/07/2016 8:44 AM

## 2016-07-07 NOTE — Consult Note (Signed)
Physical Medicine and Rehabilitation Consult Reason for Consult: Acute lower extremity weakness Referring Physician: [facility]   HPI: Luis Pierce is a 83 y.o. 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 Per chart review patient lives with spouse. Reported to be independent prior to admission. Wife not able to physically assist. His daughter/Dr. Carol Ada lives in the area. Presented 07/03/2016 with acute lower extremity weakness as well as questionable slurred speech. Troponin negative. CT of the head negative for acute changes. MRI of the head showed old basal ganglia lacunar infarct mild chronic small vessel disease. 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 MRI cervical spine showed abnormal thoracic spinal cord signal partially imaged at T2. Moderate to severe canal stenosis C4-5 and moderate C3-4. CT of the chest showed right-sided pulmonary embolism without findings for right heart strain. Venous Dopplers lower extremities showed DVT right peroneal vein. MRI thoracic spine pending with poor patient cooperation. Echocardiogram with ejection fraction 06% grade 1 diastolic dysfunction. Currently maintained on intravenous heparin.AKI from baseline and 1.00 elevated at 2.61 with renal service is consulted. Renal ultrasound showed no hydronephrosis. Felt AKI likely due to relative hypotension maintained on IV fluids. Urology follow-up for gross hematuria monitored closely with history of artificial sphincter. Acute blood loss anemia 9.5 and monitored. Neurology continues to follow for lower extremity weakness suspect spinal cord infarction. Physical therapy evaluation completed 07/06/2016 with  recommendations of physical medicine rehabilitation consult.  Daughter at bedside. States that patient has had poor appetite but picked out menu for lunch today. According to nursing and "blow out" yesterday after Senokot   Review of Systems  Constitutional: Negative for chills and fever.  Eyes:       Left eye blindness  Respiratory: Positive for shortness of breath. Negative for cough.   Cardiovascular: Positive for chest pain and leg swelling. Negative for palpitations.  Gastrointestinal: Positive for constipation. Negative for nausea and vomiting.  Genitourinary: Positive for hematuria.       Urinary incontinence  Musculoskeletal: Positive for back pain, joint pain and myalgias.  Skin: Negative for rash.  Neurological: Positive for weakness. Negative for seizures.  Psychiatric/Behavioral: Positive for memory loss.  All other systems reviewed and are negative.  Past Medical History:  Diagnosis Date  . Blind left eye    retinal detached retina  . 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 Chronic kidney disease    hx kidney stones and urinary incontinence  . Colon cancer ([place])   . Coronary artery disease   . Diverticulosis   . DJD (degenerative joint disease)   . Glaucoma   . Hyperlipemia   . Hypertension   . Lipoma of back   . Melanoma of back ([place])   . Mild mitral regurgitation   . Prostate cancer ([place])   . Retinal detachment   . Urinary incontinence   . Urinary incontinence, male, stress 07/04/2016   Past Surgical History:  Procedure Laterality Date  . APPENDECTOMY    . CARDIAC CATHETERIZATION    . CHOLECYSTECTOMY    . CORONARY ARTERY BYPASS GRAFT  1982   1 vessel  . CYSTOSCOPY  09/21/2011   Procedure: CYSTOSCOPY;  Surgeon: Reece Packer, MD;  Location: [facility];  Service: Urology;  Laterality: N/A;  . EYE SURGERY     detached retinas bilateral,bil.cataract  . TONSILLECTOMY     as child  . URINARY SPHINCTER IMPLANT  09/21/2011   Procedure: ARTIFICIAL URINARY  SPHINCTER;  Surgeon: Reece Packer, MD;  Location: [facility];  Service: Urology;  Laterality: N/A;   History reviewed. No pertinent family history. Social History:  reports that he has never smoked. He has never used smokeless tobacco. He reports that he drinks about 0.6 oz of alcohol per week . He reports that he does not use drugs. Allergies: No Known Allergies Medications Prior to Admission  Medication Sig Dispense Refill  . aspirin EC 81 MG tablet Take 81 mg by mouth daily.    Marland Kitchen atorvastatin (LIPITOR) 40 MG tablet Take 40 mg by mouth daily.     . dorzolamide-timolol (COSOPT) 22.3-6.8 MG/ML ophthalmic solution Place 1 drop into the right eye 2 (two) times daily.    Marland Kitchen latanoprost (XALATAN) 0.005 % ophthalmic solution Place 1 drop into the right eye at bedtime.    . metoprolol succinate (TOPROL-XL) 50 MG 24 hr tablet Take 50 mg by mouth daily after breakfast. Take with or immediately following a meal.      Home: Home Living Family/patient expects to be discharged to:: Private residence Living Arrangements: Spouse/significant other (spouse uses walker) Available Help at Discharge: Family, Available 24 hours/day (wife - not able to physically assist) Type of Home: House Home Layout: Two level, Laundry or work area in basement (Stair lift to basement) Alternate Therapist, sports of Steps: flight  Functional History: Prior Function Level of Independence: Independent Functional Status:  Mobility: Bed Mobility Overal bed mobility: Needs Assistance Bed Mobility: Rolling Rolling: Total assist General bed mobility comments: Assist to bring LE into flexion and pt able to assist by reaching with UE to hold and pull on the rail. Attempted to bring trunk forward into long sitting but only able to bring slightly forward without 2 person assist.        ADL:    Cognition: Cognition Orientation Level: Oriented to place, Oriented to person Cognition Arousal/Alertness: Awake/alert (when  stimulated but back to sleep quickly) Behavior During Therapy: [facility] for tasks assessed/performed  Blood pressure (!) 103/59, pulse 65, temperature 97.5 F (36.4 C), temperature source Oral, resp. rate 18, height 5\' 10"  (1.778 m), weight 84.2 kg (185 lb 11.2 oz), SpO2 100 %. Physical Exam  Vitals reviewed. HENT:  Head: Normocephalic.  Eyes:  Pupils sluggish to light  Neck: Normal range of motion. Neck supple. No thyromegaly present.  Cardiovascular:  Cardiac rate control  Respiratory: Effort normal and breath sounds normal. No respiratory distress.  GI: Soft. Bowel sounds are normal. He exhibits no distension.  Neurological: He is alert.  Alert provides name and age. Limited medical historian. Follows simple commands  Motor strength is 5/5 bilateral biceps, triceps, grip, 4 in the right deltoid, 5 in the left deltoid. Lower extremity strength 0/5 in hip flexors, knee extensors, ankle dorsi flexors, plantar flexor. There is hyperactive. Babinski in the left foot, decreased tone. Absent deep tendon reflexes bilateral lower limbs. Absent sensation to light touch and proprioception bilateral lower limbs  Results for orders placed or performed during the hospital encounter of 07/03/16 (from the past 24 hour(s))  Heparin level (unfractionated)     Status: None   Collection Time: 07/07/16  3:37 AM  Result Value Ref Range   Heparin Unfractionated 0.56 0.30 - 0.70 IU/mL  Basic metabolic panel  Status: Abnormal   Collection Time: 07/07/16  3:37 AM  Result Value Ref Range   Sodium 137 135 - 145 mmol/L   Potassium 4.0 3.5 - 5.1 mmol/L   Chloride 110 101 - 111 mmol/L   CO2 17 (L) 22 - 32 mmol/L   Glucose, Bld 127 (H) 65 - 99 mg/dL   BUN 74 (H) 6 - 20 mg/dL   Creatinine, Ser 2.09 (H) 0.61 - 1.24 mg/dL   Calcium 8.2 (L) 8.9 - 10.3 mg/dL   GFR calc non Af Amer 26 (L) >60 mL/min   GFR calc Af Amer 30 (L) >60 mL/min   Anion gap 10 5 - 15   Ct Head Wo Contrast  Result Date:  07/05/2016 CLINICAL DATA:  Altered mental status and slurred speech. Agitation, new confusion, in a patient with anticoagulation. Acute lower extremity weakness concerning for cord infarct. EXAM: CT HEAD WITHOUT CONTRAST TECHNIQUE: Contiguous axial images were obtained from the base of the skull through the vertex without intravenous contrast. COMPARISON:  07/03/2016. FINDINGS: Motion degradation due to snoring. Brain: No evidence for acute infarction, hemorrhage, mass lesion, hydrocephalus, or extra-axial fluid. Generalized atrophy. Chronic microvascular ischemic change. Vascular: No signs of proximal large vessel occlusion. Advanced atherosclerotic calcification of the skull base internal carotid arteries and distal vertebral arteries. Skull: Normal. Negative for fracture or focal lesion. Sinuses/Orbits: No layering fluid in the sinuses. Unusual appearing banding devices are noted, surrounding both globes. The band on the RIGHT is partially metallic. The band on the LEFT is low attenuation, near fat density. BILATERAL cataract extraction. Other: None. Compared with 07/03/2016, similar appearance. IMPRESSION: In this patient who is anticoagulated, with altered mental status, there are no acute areas of parenchymal or subarachnoid hemorrhage. Stable appearing atrophy with extensive white matter disease. BILATERAL ocular banding procedures, as discussed above. Electronically Signed   By: Staci Righter M.D.   On: 07/05/2016 13:39   Mr Brain Wo Contrast  Result Date: 07/06/2016 CLINICAL DATA:  Acute lower extremity weakness, LEFT back pain and falls. History of prostate cancer, melanoma. EXAM: MRI HEAD WITHOUT CONTRAST MRI CERVICAL SPINE WITHOUT CONTRAST TECHNIQUE: Multiplanar, multiecho pulse sequences of the brain and surrounding structures, and cervical spine, to include the craniocervical junction and cervicothoracic junction, were obtained without intravenous contrast. Axial T2 sequence through cervical spine  not obtained, patient was unable to tolerate further imaging. MRI thoracic spine performed at this time. COMPARISON:  CT HEAD July 05, 2016 FINDINGS: MRI HEAD FINDINGS- mildly motion degraded examination. BRAIN: Focal reduced diffusion LEFT temporal lobe/ hippocampus with low ADC values. No susceptibility artifact to suggest hemorrhage. The ventricles and sulci are normal for patient's age. Scattered subcentimeter supratentorial white matter FLAIR T2 hyperintensities are less than expected for age compatible with mild chronic small vessel ischemic disease. Old bilateral basal ganglia lacunar infarcts. No suspicious parenchymal signal, masses or mass effect. No abnormal extra-axial fluid collections. VASCULAR: Normal major intracranial vascular flow voids present at skull base. SKULL AND UPPER CERVICAL SPINE: No abnormal sellar expansion. No suspicious calvarial bone marrow signal. Craniocervical junction maintained. SINUSES/ORBITS: The mastoid air-cells and included paranasal sinuses are well-aerated. The included ocular globes and orbital contents are non-suspicious. Status post bilateral ocular lens implants and scleral banding. OTHER: None. MRI CERVICAL SPINE FINDINGS- moderate to severely motion degraded sagittal STIR and axial T2 merge ALIGNMENT: Straightened cervical lordosis.  No malalignment. VERTEBRAE/DISCS: Vertebral bodies are intact. Moderate C4-5 through C6-7 disc height loss with proportional subacute on chronic discogenic endplate changes. Disc desiccation  all cervical levels. No suspicious or acute bone marrow signal. CORD:Mildly expansile T2 bright signal at T2, incompletely evaluated. No abnormal cervical spine cord signal or syrinx. POSTERIOR FOSSA, VERTEBRAL ARTERIES, PARASPINAL TISSUES: Mild bright interstitial STIR signal upper thoracic paraspinal soft tissues. Limited overall assessment. DISC LEVELS: Limited assessment due to motion. Moderate to severe canal stenosis C4-5 due to broad-based  disc bulge, moderate at C3-4. Nondiagnostic evaluation of the neural foramen. IMPRESSION: MRI HEAD: Mildly motion degraded examination. Acute small LEFT temporal lobe/ hippocampal infarct, less likely infectious etiology. Old basal ganglia lacunar infarcts and mild chronic small vessel ischemic disease. MRI CERVICAL SPINE: Abnormal thoracic spinal cord signal partially imaged at T2. Mild suspected paraspinal muscle strain thoracic spine. Recommend MRI thoracic spine with and without contrast when patient is able to tolerate further imaging. Limited four sequence MRI, motion degraded. Moderate to severe canal stenosis C4-5 suspected, moderate at C3-4. Nondiagnostic assessment of the neural foramen. Electronically Signed   By: Elon Alas M.D.   On: 07/06/2016 19:33   Mr Cervical Spine Wo Contrast  Result Date: 07/06/2016 CLINICAL DATA:  Acute lower extremity weakness, LEFT back pain and falls. History of prostate cancer, melanoma. EXAM: MRI HEAD WITHOUT CONTRAST MRI CERVICAL SPINE WITHOUT CONTRAST TECHNIQUE: Multiplanar, multiecho pulse sequences of the brain and surrounding structures, and cervical spine, to include the craniocervical junction and cervicothoracic junction, were obtained without intravenous contrast. Axial T2 sequence through cervical spine not obtained, patient was unable to tolerate further imaging. MRI thoracic spine performed at this time. COMPARISON:  CT HEAD July 05, 2016 FINDINGS: MRI HEAD FINDINGS- mildly motion degraded examination. BRAIN: Focal reduced diffusion LEFT temporal lobe/ hippocampus with low ADC values. No susceptibility artifact to suggest hemorrhage. The ventricles and sulci are normal for patient's age. Scattered subcentimeter supratentorial white matter FLAIR T2 hyperintensities are less than expected for age compatible with mild chronic small vessel ischemic disease. Old bilateral basal ganglia lacunar infarcts. No suspicious parenchymal signal, masses or mass  effect. No abnormal extra-axial fluid collections. VASCULAR: Normal major intracranial vascular flow voids present at skull base. SKULL AND UPPER CERVICAL SPINE: No abnormal sellar expansion. No suspicious calvarial bone marrow signal. Craniocervical junction maintained. SINUSES/ORBITS: The mastoid air-cells and included paranasal sinuses are well-aerated. The included ocular globes and orbital contents are non-suspicious. Status post bilateral ocular lens implants and scleral banding. OTHER: None. MRI CERVICAL SPINE FINDINGS- moderate to severely motion degraded sagittal STIR and axial T2 merge ALIGNMENT: Straightened cervical lordosis.  No malalignment. VERTEBRAE/DISCS: Vertebral bodies are intact. Moderate C4-5 through C6-7 disc height loss with proportional subacute on chronic discogenic endplate changes. Disc desiccation all cervical levels. No suspicious or acute bone marrow signal. CORD:Mildly expansile T2 bright signal at T2, incompletely evaluated. No abnormal cervical spine cord signal or syrinx. POSTERIOR FOSSA, VERTEBRAL ARTERIES, PARASPINAL TISSUES: Mild bright interstitial STIR signal upper thoracic paraspinal soft tissues. Limited overall assessment. DISC LEVELS: Limited assessment due to motion. Moderate to severe canal stenosis C4-5 due to broad-based disc bulge, moderate at C3-4. Nondiagnostic evaluation of the neural foramen. IMPRESSION: MRI HEAD: Mildly motion degraded examination. Acute small LEFT temporal lobe/ hippocampal infarct, less likely infectious etiology. Old basal ganglia lacunar infarcts and mild chronic small vessel ischemic disease. MRI CERVICAL SPINE: Abnormal thoracic spinal cord signal partially imaged at T2. Mild suspected paraspinal muscle strain thoracic spine. Recommend MRI thoracic spine with and without contrast when patient is able to tolerate further imaging. Limited four sequence MRI, motion degraded. Moderate to severe canal stenosis C4-5 suspected,  moderate at C3-4.  Nondiagnostic assessment of the neural foramen. Electronically Signed   By: Elon Alas M.D.   On: 07/06/2016 19:33   US Pelvis Limited  Result Date: 07/06/2016 CLINICAL DATA:  Hematuria.  Concern for blood clot or bladder mass. EXAM: LIMITED ULTRASOUND OF PELVIS TECHNIQUE: Limited transabdominal ultrasound examination of the pelvis was performed. COMPARISON:  CT 01/26/2016 FINDINGS: Urinary bladder is minimally distended. There is some heterogeneous intraluminal debris. Hyperdensity dependently measures 1.6 x 0.8 x 2.0 cm with no definite posterior shadowing. IMPRESSION: Minimally distended bladder with some intraluminal debris. Dependent hyperdensity measuring 1.6 x 0.8 x 2.0 cm has no definite posterior shadowing. This may reflect small stones or clot. Electronically Signed   By: Jeb Levering M.D.   On: 07/06/2016 04:28    Assessment/Plan: Diagnosis: Paraparesis. 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 1. Does the need for close, 24 hr/day medical supervision in concert with the patient's rehab needs make it unreasonable for this patient to be served in a less intensive setting? Yes 2. Co-Morbidities requiring supervision/potential complications: DVT/PE on IV heparin, history of blindness, chronic left eye, subacute, right eye, chronic bladder incontinence with artificial urinary sphincter, now having hematuria requiring bladder irrigation and deactivation of urinary sphincter. 3. Due to bladder management, bowel management, safety, skin/wound care, disease management, medication administration, pain management and patient education, does the patient require 24 hr/day rehab nursing? Yes 4. Does the patient require coordinated care of a physician, rehab nurse, PT (1-2 hrs/day, 5 days/week), OT (1-2 hrs/day, 5 days/week) and SLP (.5-1 hrs/day, 5 days/week) to address physical and functional deficits in the context of the above medical  diagnosis(es)? Yes Addressing deficits in the following areas: balance, endurance, locomotion, strength, transferring, bowel/bladder control, bathing, dressing, feeding, grooming, toileting, cognition, speech, language, swallowing and psychosocial support 5. Can the patient actively participate in an intensive therapy program of at least 3 hrs of therapy per day at least 5 days per week? Yes 6. The potential for patient to make measurable gains while on inpatient rehab is fair 7. Anticipated functional outcomes upon discharge from inpatient rehab are mod assist  with PT, mod assist with OT, mod assist with SLP. 8. Estimated rehab length of stay to reach the above functional goals is: 2-3 weeks 9. Anticipated D/C setting: Other 10. Anticipated post D/C treatments: SNF rehabilitation 11. Overall Rehab/Functional Prognosis: fair  RECOMMENDATIONS: This patient's condition is appropriate for continued rehabilitative care in the following setting: CIR Patient has agreed to participate in recommended program. Potentially Note that insurance prior authorization may be required for reimbursement for recommended care.  Comment: Reduce Burden of Care  admission, no caregivers at home considering hired caregivers versus SNF post rehabilitation  Charlett Blake M.D. [facility] FAAPM&R (Sports Med, Neuromuscular Med) Diplomate Am Board of Electrodiagnostic Med  Cathlyn Parsons., PA-C 07/07/2016

## 2016-07-08 ENCOUNTER — Inpatient Hospital Stay (HOSPITAL_COMMUNITY): Payer: Medicare Other

## 2016-07-08 ENCOUNTER — Encounter (HOSPITAL_COMMUNITY): Payer: Self-pay | Admitting: *Deleted

## 2016-07-08 DIAGNOSIS — R29898 Other symptoms and signs involving the musculoskeletal system: Secondary | ICD-10-CM

## 2016-07-08 DIAGNOSIS — I82409 Acute embolism and thrombosis of unspecified deep veins of unspecified lower extremity: Secondary | ICD-10-CM

## 2016-07-08 DIAGNOSIS — I6340 Cerebral infarction due to embolism of unspecified cerebral artery: Secondary | ICD-10-CM

## 2016-07-08 DIAGNOSIS — I2699 Other pulmonary embolism without acute cor pulmonale: Secondary | ICD-10-CM

## 2016-07-08 LAB — BASIC METABOLIC PANEL
Anion gap: 5 (ref 5–15)
BUN: 59 mg/dL — ABNORMAL HIGH (ref 6–20)
CHLORIDE: 112 mmol/L — AB (ref 101–111)
CO2: 21 mmol/L — ABNORMAL LOW (ref 22–32)
CREATININE: 1.17 mg/dL (ref 0.61–1.24)
Calcium: 8.2 mg/dL — ABNORMAL LOW (ref 8.9–10.3)
GFR, EST NON AFRICAN AMERICAN: 53 mL/min — AB (ref >60)
Glucose, Bld: 104 mg/dL — ABNORMAL HIGH (ref 65–99)
Potassium: 3.9 mmol/L (ref 3.5–5.1)
SODIUM: 138 mmol/L (ref 135–145)

## 2016-07-08 LAB — LIPID PANEL
Cholesterol: 102 mg/dL (ref 0–200)
HDL: 24 mg/dL — ABNORMAL LOW (ref >40)
LDL Cholesterol: 52 mg/dL (ref 0–99)
TRIGLYCERIDES: 128 mg/dL (ref <150)
Total CHOL/HDL Ratio: 4.3 RATIO
VLDL: 26 mg/dL (ref 0–40)

## 2016-07-08 LAB — CULTURE, BLOOD (ROUTINE X 2)
Culture: NO GROWTH
Culture: NO GROWTH
SPECIAL REQUESTS: ADEQUATE
SPECIAL REQUESTS: ADEQUATE

## 2016-07-08 LAB — HEPARIN LEVEL (UNFRACTIONATED): HEPARIN UNFRACTIONATED: 0.31 [IU]/mL (ref 0.30–0.70)

## 2016-07-08 LAB — CBC
HEMATOCRIT: 25.4 % — AB (ref 39.0–52.0)
HEMOGLOBIN: 8.2 g/dL — AB (ref 13.0–17.0)
MCH: 29.6 pg (ref 26.0–34.0)
MCHC: 32.3 g/dL (ref 30.0–36.0)
MCV: 91.7 fL (ref 78.0–100.0)
Platelets: 224 10*3/uL (ref 150–400)
RBC: 2.77 MIL/uL — ABNORMAL LOW (ref 4.22–5.81)
RDW: 14.5 % (ref 11.5–15.5)
WBC: 12.7 10*3/uL — AB (ref 4.0–10.5)

## 2016-07-08 LAB — HEMOGLOBIN AND HEMATOCRIT, BLOOD
HCT: 23.3 % — ABNORMAL LOW (ref 39.0–52.0)
HEMOGLOBIN: 7.7 g/dL — AB (ref 13.0–17.0)

## 2016-07-08 LAB — VITAMIN D 25 HYDROXY (VIT D DEFICIENCY, FRACTURES): VIT D 25 HYDROXY: 8.2 ng/mL — AB (ref 30.0–100.0)

## 2016-07-08 MED ORDER — IOPAMIDOL (ISOVUE-300) INJECTION 61%
INTRAVENOUS | Status: AC
  Filled 2016-07-08: qty 30

## 2016-07-08 MED ORDER — DEXAMETHASONE SODIUM PHOSPHATE 10 MG/ML IJ SOLN
10.0000 mg | Freq: Four times a day (QID) | INTRAMUSCULAR | Status: DC
  Administered 2016-07-08 – 2016-07-09 (×5): 10 mg via INTRAVENOUS
  Filled 2016-07-08 (×4): qty 1

## 2016-07-08 MED ORDER — IOPAMIDOL (ISOVUE-300) INJECTION 61%
INTRAVENOUS | Status: AC
  Administered 2016-07-08: 80 mL
  Filled 2016-07-08: qty 100

## 2016-07-08 MED ORDER — VITAMIN D (ERGOCALCIFEROL) 1.25 MG (50000 UNIT) PO CAPS
50000.0000 [IU] | ORAL_CAPSULE | ORAL | Status: DC
  Administered 2016-07-08: 50000 [IU] via ORAL
  Filled 2016-07-08: qty 1

## 2016-07-08 MED ORDER — APIXABAN 2.5 MG PO TABS
2.5000 mg | ORAL_TABLET | Freq: Two times a day (BID) | ORAL | Status: DC
  Administered 2016-07-08 – 2016-07-09 (×4): 2.5 mg via ORAL
  Filled 2016-07-08 (×4): qty 1

## 2016-07-08 NOTE — Consult Note (Signed)
Reason for Consult: Paraplegia Referring Physician: Dr. [name] is an 83 y.o. male.  HPI: The patient is a 83 year old white male with a remote history of 3 different cancers. He has had melanoma which was completely excised many years ago. He had prostate cancer which was treated with prostatectomy many years ago. He is also had colon cancer which was treated with resection years ago.  The patient began having upper thoracic pain late last week. The patient legs became numb and weak and he lost the ability to ambulate on 07/03/2016. The patient was admitted for further workup. He was noted to have a deep venous thrombosis and pulmonary embolism and he was started on heparin. The plan was to work him up with spinal MRIs but there was some concern about ocular implants. He subsequently got clearance to get MRIs and had a MRI of the cervical thoracic spine today. The cervical spine demonstrated age-appropriate degenerative changes. His thoracic MRI demonstrated epidural metastasis from approximately T1-T3. A neurosurgical consultation was requested. I attempted to evaluate the patient this afternoon but he was getting a CAT scan. The CT scan of his abdomen and pelvis demonstrated the patient has lytic pelvic metastasis and a bladder mass concerning for prostate cancer versus bladder cancer. The patient has seen his urologist, Dr. Arther Dames this afternoon.  Presently the patient is accompanied by his daughter, Dr. Carol Ada and his granddaughter and grandson. He denies pain. He says he cannot move or feel his legs. He has not been able to move his legs since 07/03/2016.    Past Medical History:  Diagnosis Date  . Blind left eye    retinal detached retina  . 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 Chronic kidney disease    hx kidney stones and urinary incontinence  . Colon cancer ([place])   . Coronary artery disease   . Diverticulosis   . DJD (degenerative joint disease)   . Glaucoma   .  Hyperlipemia   . Hypertension   . Lipoma of back   . Melanoma of back ([place])   . Mild mitral regurgitation   . Prostate cancer ([place])   . Retinal detachment   . Urinary incontinence   . Urinary incontinence, male, stress 07/04/2016    Past Surgical History:  Procedure Laterality Date  . APPENDECTOMY    . CARDIAC CATHETERIZATION    . CHOLECYSTECTOMY    . CORONARY ARTERY BYPASS GRAFT  1982   1 vessel  . CYSTOSCOPY  09/21/2011   Procedure: CYSTOSCOPY;  Surgeon: Reece Packer, MD;  Location: [facility];  Service: Urology;  Laterality: N/A;  . EYE SURGERY     detached retinas bilateral,bil.cataract  . TONSILLECTOMY     as child  . URINARY SPHINCTER IMPLANT  09/21/2011   Procedure: ARTIFICIAL URINARY SPHINCTER;  Surgeon: Reece Packer, MD;  Location: [facility];  Service: Urology;  Laterality: N/A;    History reviewed. No pertinent family history.  Social History:  reports that he has never smoked. He has never used smokeless tobacco. He reports that he drinks about 0.6 oz of alcohol per week . He reports that he does not use drugs.  Allergies: No Known Allergies  Medications:  I have reviewed the patient's current medications. Prior to Admission:  Prescriptions Prior to Admission  Medication Sig Dispense Refill Last Dose  . aspirin EC 81 MG tablet Take 81 mg by mouth daily.   07/02/2016 at Unknown time  . atorvastatin (LIPITOR) 40 MG  tablet Take 40 mg by mouth daily.    07/02/2016 at Unknown time  . dorzolamide-timolol (COSOPT) 22.3-6.8 MG/ML ophthalmic solution Place 1 drop into the right eye 2 (two) times daily.   07/02/2016 at Unknown time  . latanoprost (XALATAN) 0.005 % ophthalmic solution Place 1 drop into the right eye at bedtime.   07/02/2016 at Unknown time  . metoprolol succinate (TOPROL-XL) 50 MG 24 hr tablet Take 50 mg by mouth daily after breakfast. Take with or immediately following a meal.   07/02/2016 at 2100   Scheduled: . apixaban  2.5 mg Oral BID  . atorvastatin  40  mg Oral q1800  . cyanocobalamin  1,000 mcg Intramuscular Daily  . dexamethasone  10 mg Intravenous Q6H  . dorzolamide-timolol  1 drop Right Eye BID  . fentaNYL (SUBLIMAZE) injection  50 mcg Intravenous Once  . iopamidol      . latanoprost  1 drop Right Eye QHS  . mouth rinse  15 mL Mouth Rinse q12n4p  . Vitamin D (Ergocalciferol)  50,000 Units Oral Q7 days   Continuous: . sodium chloride 75 mL/hr at 07/08/16 0148  . sodium chloride irrigation     WUJ:[id number] **OR** acetaminophen, bisacodyl, HYDROcodone-acetaminophen, LORazepam, ondansetron **OR** ondansetron (ZOFRAN) IV, senna-docusate Anti-infectives    Start     Dose/Rate Route Frequency Ordered Stop   07/03/16 1800  cefTRIAXone (ROCEPHIN) 1 g in dextrose 5 % 50 mL IVPB  Status:  Discontinued     1 g 100 mL/hr over 30 Minutes Intravenous Every 24 hours 07/03/16 1734 07/06/16 1702       Results for orders placed or performed during the hospital encounter of 07/03/16 (from the past 48 hour(s))  Heparin level (unfractionated)     Status: None   Collection Time: 07/07/16  3:37 AM  Result Value Ref Range   Heparin Unfractionated 0.56 0.30 - 0.70 IU/mL    Comment:        IF HEPARIN RESULTS ARE BELOW EXPECTED VALUES, AND PATIENT DOSAGE HAS BEEN CONFIRMED, SUGGEST FOLLOW UP TESTING OF ANTITHROMBIN III LEVELS.   VITAMIN D 25 Hydroxy (Vit-D Deficiency, Fractures)     Status: Abnormal   Collection Time: 07/07/16  3:37 AM  Result Value Ref Range   Vit D, 25-Hydroxy 8.2 (L) 30.0 - 100.0 ng/mL    Comment: (NOTE) Vitamin D deficiency has been defined by the [place] practice guideline as a level of serum 25-OH vitamin D less than 20 ng/mL (1,2). The Endocrine Society went on to further define vitamin D insufficiency as a level between 21 and 29 ng/mL (2). 1. IOM (Institute of Medicine). 2010. Dietary reference   intakes for calcium and D. [place]: The   [employer]. 2.  Holick MF, [place], Bischoff-Ferrari HA, et al.   Evaluation, treatment, and prevention of vitamin D   deficiency: an Endocrine Society clinical practice   guideline. JCEM. 2011 Jul; 96(7):1911-30. Performed At: [facility] [place] [place], [place] [id number] Lindon Romp MD ON:[id number]   Basic metabolic panel     Status: Abnormal   Collection Time: 07/07/16  3:37 AM  Result Value Ref Range   Sodium 137 135 - 145 mmol/L   Potassium 4.0 3.5 - 5.1 mmol/L   Chloride 110 101 - 111 mmol/L   CO2 17 (L) 22 - 32 mmol/L   Glucose, Bld 127 (H) 65 - 99 mg/dL   BUN 74 (H) 6 - 20 mg/dL  Creatinine, Ser 2.09 (H) 0.61 - 1.24 mg/dL   Calcium 8.2 (L) 8.9 - 10.3 mg/dL   GFR calc non Af Amer 26 (L) >60 mL/min   GFR calc Af Amer 30 (L) >60 mL/min    Comment: (NOTE) The eGFR has been calculated using the CKD EPI equation. This calculation has not been validated in all clinical situations. eGFR's persistently <60 mL/min signify possible Chronic Kidney Disease.    Anion gap 10 5 - 15  Hemoglobin and hematocrit, blood     Status: Abnormal   Collection Time: 07/07/16  1:08 PM  Result Value Ref Range   Hemoglobin 8.8 (L) 13.0 - 17.0 g/dL   HCT 27.1 (L) 39.0 - 52.0 %  CBC     Status: Abnormal   Collection Time: 07/08/16  3:34 AM  Result Value Ref Range   WBC 12.7 (H) 4.0 - 10.5 K/uL   RBC 2.77 (L) 4.22 - 5.81 MIL/uL   Hemoglobin 8.2 (L) 13.0 - 17.0 g/dL   HCT 25.4 (L) 39.0 - 52.0 %   MCV 91.7 78.0 - 100.0 fL   MCH 29.6 26.0 - 34.0 pg   MCHC 32.3 30.0 - 36.0 g/dL   RDW 14.5 11.5 - 15.5 %   Platelets 224 150 - 400 K/uL  Heparin level (unfractionated)     Status: None   Collection Time: 07/08/16  3:34 AM  Result Value Ref Range   Heparin Unfractionated 0.31 0.30 - 0.70 IU/mL    Comment:        IF HEPARIN RESULTS ARE BELOW EXPECTED VALUES, AND PATIENT DOSAGE HAS BEEN CONFIRMED, SUGGEST FOLLOW UP TESTING OF ANTITHROMBIN III LEVELS.   Basic metabolic panel     Status:  Abnormal   Collection Time: 07/08/16  3:34 AM  Result Value Ref Range   Sodium 138 135 - 145 mmol/L   Potassium 3.9 3.5 - 5.1 mmol/L   Chloride 112 (H) 101 - 111 mmol/L   CO2 21 (L) 22 - 32 mmol/L   Glucose, Bld 104 (H) 65 - 99 mg/dL   BUN 59 (H) 6 - 20 mg/dL   Creatinine, Ser 1.17 0.61 - 1.24 mg/dL   Calcium 8.2 (L) 8.9 - 10.3 mg/dL   GFR calc non Af Amer 53 (L) >60 mL/min   GFR calc Af Amer >60 >60 mL/min    Comment: (NOTE) The eGFR has been calculated using the CKD EPI equation. This calculation has not been validated in all clinical situations. eGFR's persistently <60 mL/min signify possible Chronic Kidney Disease.    Anion gap 5 5 - 15  Lipid panel     Status: Abnormal   Collection Time: 07/08/16  3:34 AM  Result Value Ref Range   Cholesterol 102 0 - 200 mg/dL   Triglycerides 128 <150 mg/dL   HDL 24 (L) >40 mg/dL   Total CHOL/HDL Ratio 4.3 RATIO   VLDL 26 0 - 40 mg/dL   LDL Cholesterol 52 0 - 99 mg/dL    Comment:        Total Cholesterol/HDL:CHD Risk Coronary Heart Disease Risk Table                     Men   Women  1/2 Average Risk   3.4   3.3  Average Risk       5.0   4.4  2 X Average Risk   9.6   7.1  3 X Average Risk  23.4   11.0  Use the calculated Patient Ratio above and the CHD Risk Table to determine the patient's CHD Risk.        ATP III CLASSIFICATION (LDL):  <100     mg/dL   Optimal  100-129  mg/dL   Near or Above                    Optimal  130-159  mg/dL   Borderline  160-189  mg/dL   High  >190     mg/dL   Very High     Mr Cervical Spine Wo Contrast  Result Date: 07/08/2016 CLINICAL DATA:  Episode of weakness and numbness in the lower extremities bilaterally 07/03/2016. Acute/subacute left temporal lobe infarct. Abnormal signal within the upper thoracic spinal cord on the cervical spine MRI. The examination had to be discontinued prior to completion due to patient refusal for further imaging. EXAM: MRI CERVICAL AND THORACIC SPINE WITHOUT  CONTRAST TECHNIQUE: Multiplanar and multiecho pulse sequences of the cervical spine, to include the craniocervical junction and cervicothoracic junction, and thoracic spine, were obtained without intravenous contrast. COMPARISON:  MRI of cervical spine 07/06/2016. FINDINGS: MRI CERVICAL SPINE FINDINGS Alignment: AP alignment is anatomic. There straightening of the normal cervical lordosis. Vertebrae: Chronic endplate marrow changes are again noted at C4-5 and on the right at C6-7. Vertebral body heights are maintained. Cord: Cervical spinal cord signal is normal. Posterior Fossa, vertebral arteries, paraspinal tissues: The craniocervical junction is normal. The visualized intracranial contents are normal. Flow is present in the vertebral arteries bilaterally. Disc levels: Disc disease is not significantly changed. C2-3: Moderate right foraminal narrowing is due to uncovertebral and facet spurring. C3-4: A broad-based disc osteophyte complex and facet hypertrophy contributes to moderate left greater than right foraminal narrowing and moderate right central canal stenosis. C4-5: Moderate to severe central canal stenosis is present. There is distortion of the spinal cord. The canal is narrowed to 7 mm. Severe foraminal stenosis is worse on the left. C5-6: A rightward disc osteophyte complex is present. There is partial effacement of the ventral CSF. Moderate right and mild left foraminal narrowing is present. C6-7: Uncovertebral spurring bilaterally is stable. Mild foraminal narrowing is worse on the left. C7-T1: Moderate facet hypertrophy is present bilaterally without significant stenosis. MRI THORACIC SPINE FINDINGS Alignment:  AP alignment is anatomic. Vertebrae: There is diffuse heterogeneous marrow signal. Abnormal signal is present diffusely within the T3 vertebral body involving the posterior elements bilaterally. There is abnormal signal on the right at T4 extending into the posterior elements. Epidural soft  tissue extends cephalo caudad 4.3 cm from the mid body aches T2-3 the midbody of T4. This results in significant central canal stenosis. There is abnormal signal within the spinal cord at this level. Extensive T2 signal changes are present within the soft tissues posterior to the thoracic spine at this level as well. The lesion at T9 measures 3.1 x 1.9 cm on the right. A smaller superior right-sided lesion measures 9 mm. A 10.5 mm lesion is evident posteriorly and inferiorly on the left at T11. No other definite involvement of posterior elements is noted. There is also abnormal signal in the right pedicle of T4. Cord: Abnormal cord signal is present from the midbody of T2 through T4 associated with cord compression by extraosseous soft tissue mass. Paraspinal and other soft tissues: Abnormal signal is present within the posterior paraspinal soft tissues T2-4. Disc levels: No significant focal disc protrusion is present. Facet hypertrophy contributes to mild right foraminal  narrowing at T2-3. The foramina are otherwise patent. No other significant central canal stenosis is present. IMPRESSION: 1. Multiple osseous lesions within the thoracic spine, most notably at T3, T4, T9, and T11, suggesting metastatic disease. Multiple myeloma is also considered. 2. Extraosseous soft tissue mass likely reflecting tumor extension at the T3 level results in severe central canal stenosis with mass effect on the spinal cord. Abnormal cord signal a a mix extends from the midbody of T2 through the midbody of T4 . Central cord tumor is considered less likely. 3. Soft tissue signal changes posteriorly at the T2 level and extending above. This may reflect tumor or infection. 4. No significant extraosseous extension at the T9 or T10 levels. 5. Stable multilevel spondylosis in the cervical spine. 6. Heterogeneous marrow signal of the cervical spine appears be mostly discogenic. Additional sites of metastatic disease are not excluded. 7. The  patient refused further imaging with contrast. Critical Value/emergent results were called by telephone at the time of interpretation on 07/08/2016 at 11:51 am to Dr. Rosalin Hawking , who verbally acknowledged these results. Electronically Signed   By: San Morelle M.D.   On: 07/08/2016 12:23   Mr Thoracic Spine Wo Contrast  Result Date: 07/08/2016 CLINICAL DATA:  Episode of weakness and numbness in the lower extremities bilaterally 07/03/2016. Acute/subacute left temporal lobe infarct. Abnormal signal within the upper thoracic spinal cord on the cervical spine MRI. The examination had to be discontinued prior to completion due to patient refusal for further imaging. EXAM: MRI CERVICAL AND THORACIC SPINE WITHOUT CONTRAST TECHNIQUE: Multiplanar and multiecho pulse sequences of the cervical spine, to include the craniocervical junction and cervicothoracic junction, and thoracic spine, were obtained without intravenous contrast. COMPARISON:  MRI of cervical spine 07/06/2016. FINDINGS: MRI CERVICAL SPINE FINDINGS Alignment: AP alignment is anatomic. There straightening of the normal cervical lordosis. Vertebrae: Chronic endplate marrow changes are again noted at C4-5 and on the right at C6-7. Vertebral body heights are maintained. Cord: Cervical spinal cord signal is normal. Posterior Fossa, vertebral arteries, paraspinal tissues: The craniocervical junction is normal. The visualized intracranial contents are normal. Flow is present in the vertebral arteries bilaterally. Disc levels: Disc disease is not significantly changed. C2-3: Moderate right foraminal narrowing is due to uncovertebral and facet spurring. C3-4: A broad-based disc osteophyte complex and facet hypertrophy contributes to moderate left greater than right foraminal narrowing and moderate right central canal stenosis. C4-5: Moderate to severe central canal stenosis is present. There is distortion of the spinal cord. The canal is narrowed to 7 mm.  Severe foraminal stenosis is worse on the left. C5-6: A rightward disc osteophyte complex is present. There is partial effacement of the ventral CSF. Moderate right and mild left foraminal narrowing is present. C6-7: Uncovertebral spurring bilaterally is stable. Mild foraminal narrowing is worse on the left. C7-T1: Moderate facet hypertrophy is present bilaterally without significant stenosis. MRI THORACIC SPINE FINDINGS Alignment:  AP alignment is anatomic. Vertebrae: There is diffuse heterogeneous marrow signal. Abnormal signal is present diffusely within the T3 vertebral body involving the posterior elements bilaterally. There is abnormal signal on the right at T4 extending into the posterior elements. Epidural soft tissue extends cephalo caudad 4.3 cm from the mid body aches T2-3 the midbody of T4. This results in significant central canal stenosis. There is abnormal signal within the spinal cord at this level. Extensive T2 signal changes are present within the soft tissues posterior to the thoracic spine at this level as well. The lesion at  T9 measures 3.1 x 1.9 cm on the right. A smaller superior right-sided lesion measures 9 mm. A 10.5 mm lesion is evident posteriorly and inferiorly on the left at T11. No other definite involvement of posterior elements is noted. There is also abnormal signal in the right pedicle of T4. Cord: Abnormal cord signal is present from the midbody of T2 through T4 associated with cord compression by extraosseous soft tissue mass. Paraspinal and other soft tissues: Abnormal signal is present within the posterior paraspinal soft tissues T2-4. Disc levels: No significant focal disc protrusion is present. Facet hypertrophy contributes to mild right foraminal narrowing at T2-3. The foramina are otherwise patent. No other significant central canal stenosis is present. IMPRESSION: 1. Multiple osseous lesions within the thoracic spine, most notably at T3, T4, T9, and T11, suggesting  metastatic disease. Multiple myeloma is also considered. 2. Extraosseous soft tissue mass likely reflecting tumor extension at the T3 level results in severe central canal stenosis with mass effect on the spinal cord. Abnormal cord signal a a mix extends from the midbody of T2 through the midbody of T4 . Central cord tumor is considered less likely. 3. Soft tissue signal changes posteriorly at the T2 level and extending above. This may reflect tumor or infection. 4. No significant extraosseous extension at the T9 or T10 levels. 5. Stable multilevel spondylosis in the cervical spine. 6. Heterogeneous marrow signal of the cervical spine appears be mostly discogenic. Additional sites of metastatic disease are not excluded. 7. The patient refused further imaging with contrast. Critical Value/emergent results were called by telephone at the time of interpretation on 07/08/2016 at 11:51 am to Dr. Rosalin Hawking , who verbally acknowledged these results. Electronically Signed   By: San Morelle M.D.   On: 07/08/2016 12:23   Ct Abdomen Pelvis W Contrast  Result Date: 07/08/2016 CLINICAL DATA:  Hematuria.  History of prostate cancer. EXAM: CT ABDOMEN AND PELVIS WITH CONTRAST TECHNIQUE: Multidetector CT imaging of the abdomen and pelvis was performed using the standard protocol following bolus administration of intravenous contrast. CONTRAST:  2m ISOVUE-300 IOPAMIDOL (ISOVUE-300) INJECTION 61% COMPARISON:  CT scan of January 26, 2016. FINDINGS: Lower chest: Mild bibasilar posterior subsegmental atelectasis is noted. Hepatobiliary: No focal liver abnormality is seen. Status post cholecystectomy. No biliary dilatation. Pancreas: Unremarkable. No pancreatic ductal dilatation or surrounding inflammatory changes. Spleen: Normal in size without focal abnormality. Adrenals/Urinary Tract: Adrenal glands are unremarkable. Bilateral parapelvic cysts are noted. Nonobstructive 1 cm calculus is seen in middle pole infundibulum.  Mild bilateral hydroureteronephrosis is noted without obstructing calculus. Urinary bladder is decompressed secondary to Foley catheter. Several bladder calculi are noted. 4.5 x 2.9 cm irregular lobulated mass appears to be involving the posterior wall of the urinary bladder which is slightly enlarged compared to prior exam. This is most consistent with invasive and recurrent prostate cancer. Cystoscopy is recommended for further evaluation. Stomach/Bowel: Stomach is unremarkable. Status post right hemicolectomy, with small bowel dilatation extending to the surgical anastomosis. This is concerning for at least partial small bowel obstruction. Vascular/Lymphatic: Aortic atherosclerosis. No enlarged abdominal or pelvic lymph nodes. Reproductive: As noted above, 4.5 x 2.9 cm lobulated mass is seen in posterior inferior portion of urinary bladder concerning for invasive and recurrent prostate cancer. Other: Penile reservoir is noted in left lower quadrant. No hernia or abnormal fluid collection is noted. Musculoskeletal: 2 sclerotic lesions are noted in the left iliac wing which are increased compared to prior exam and consistent with metastatic disease. Stable sclerotic density  seen in right iliac wing concerning for metastatic disease. New metastatic focus is noted in inferior portion of left ischial tuberosity. IMPRESSION: Mild bibasilar subsegmental atelectasis. Nonobstructive 1 cm right renal calculus. Mild bilateral hydroureteronephrosis is noted without obstructing calculus. Urinary bladder is decompressed secondary to Foley catheter. Several bladder calculi are noted. 4.5 x 2.9 cm irregular lobulated mass appears to be involving the posterior and inferior wall of urinary bladder which is slightly enlarged compared to prior exam. This is concerning for invasive recurrent prostate cancer, and cystoscopy is recommended for further evaluation. Status post right hemicolectomy. Moderate small bowel dilatation is noted  which extends to the surgical anastomosis in the right side of the abdomen, concerning for partial small bowel obstruction. Aortic atherosclerosis. Osseous metastatic lesions are noted in the left side of the pelvis which are increased compared to prior exam. Electronically Signed   By: Marijo Conception, M.D.   On: 07/08/2016 15:55    ROS: As above Blood pressure (!) 110/53, pulse 88, temperature 98.4 F (36.9 C), temperature source Oral, resp. rate 18, height '5\' 10"'$  (1.778 m), weight 86 kg (189 lb 9.6 oz), SpO2 90 %. Physical Exam  General: An alert, pleasant, blind, hard of hearing paraplegic 83 year old white male in no apparent distress  Neck: Unremarkable  Thorax: Symmetric  Abdomen: Soft  Extremities: Unremarkable  Neurologic exam: The patient is alert and oriented. He is almost blind bilaterally. There is severe presbycusis. The patient's motor strength is grossly normal bilaterally. Grip, bicep and tricep. He has 0/5 bilateral psoas, quadriceps, gastrocnemius, and dorsiflexor strength. He has no sensation in his bilateral lower extremities. He maintains some crude sensation in his thorax.  I have reviewed the patient's cervical MRI performed at [facility] today. He has some mild degenerative/spondylotic change without significant neural compression.  Of also reviewed the patient's thoracic MRI performed at [facility] today. He has a posterior epidural lesion from approximately T1-T3. There is moderate spinal stenosis. He has noted again spinal cord signal abnormality at these levels.  Assessment/Plan: Thoracic epidural metastasis, paraplegia: I have discussed the situation with the patient, his daughter, and grandchildren. We have discussed the various treatment options. I have told them that at his age and with his medical comorbidities I don't think surgery is a good idea. Furthermore I think surgery would be futile since I think he has likely infarcted his spinal  cord and at this point even with a surgical decompression, I doubt his paraplegia would improve.  I would suggest IV steroids, i.e. Decadron for a few days in the off chance that this may provide some improvement. I don't think the Solu-Medrol protocol is indicated.  We discussed back bracing. He is not having any pain and I don't think he is unstable. So I don't think back bracing is indicated.  We have also discussed palliative radiation therapy. I don't think this has much of a role since he is not having pain.  I think the patient would be best served by rehabilitation to help him adjust to his permanent paraplegia and to make arrangements for hospice care.  I answered all the patient's, and his family's, questions. Please call if I can be of further assistance.  Maddy Graham D 07/08/2016, 7:56 PM

## 2016-07-08 NOTE — Progress Notes (Signed)
Paged urologist. Pts urine noted to be dark maroon in color. MD instructed as long as it seems to be draining, to leave the CBI off, and he will be around later today. Will continue to monitor.

## 2016-07-08 NOTE — Consult Note (Signed)
[place] Nurse wound consult note Reason for Consult: Stage 1 pressure injury.  Upon assessment, patient found to have a deep tissue pressure injury (DTPI) to the lower aspect of the right buttock near gluteal cleft. Bilateral LEs are wrapped and heel are floated at the time of my assessment. Patient is slightly hard of hearing, but is articulate and engaging, participating in my consultation fully. Wound type:Pressure plus moisture (fecal incontinence yesterday), plus shear (HOB elevation). Pressure Injury POA:No Measurement:3cm x 4.5cm area of deep maroon discoloration  Wound bed:No open wound at this time Drainage (amount, consistency, odor) None Periwound: intact, dry Dressing procedure/placement/frequency: Patient is on a mattress replacement with low air loss feature, there is no pressure redistribution chair cushion in the room.  Patient can assist with turning, but requires assistance to fully redistribution pressure off of the affected area. Patient taught that area is resultant from sustained pressure and the measures being taken to redistribution pressure (mattress, turning and repositioning).  He is provided with further information regarding the need to minimize time in the supine position, lower the [facility] to below a 30 degree angle to prevent "sliding" (ie., shear) and the provision of a pressure redistribution chair cushion for his use when OOB in the chair.  Patient is inquisitive about other interventions and he is taught that he has a dressing on the area. Nutrition will have a role in tissue repair and regeneration and he is encouraged to consume food and fluids both while in house and following discharge until the affected area has recovered. I will provide pressure redistribution heel boots for use while patient is in bed. Thank you for inviting [place] to consult on this nice gentleman and contribute to his POC.  The [place] nursing team will not follow routinely, but will remain available to this  patient, the nursing and medical teams.  Please re-consult if area does not progress in a manner consistent with his overall improvement or for any concerns. Thanks, Maudie Flakes, MSN, RN, [place], Arther Abbott  Pager# [phone]

## 2016-07-08 NOTE — Progress Notes (Signed)
Further workup, thoracic MRI revealing metastatic disease. Multiple thoracic vertebral bodies as well as T2-T4 epidural mass causing severe stenosis. This correlates with clinical findings. Discussed with daughter, her goal is to have patient at home with hospice care , if he qualifies, and family assistance. We discussed several questions that need to be answered before admission to inpatient rehabilitation.  1. Is the spine, stable. ? 2. What type of bracing would be necessary? 3. Is palliative radiation therapy to the thoracic spine indicated? 4. What is the underlying primary, this would be important in terms of what further treatment may be necessary, and what other complications we would expect. 5. CODE STATUS 6. Palliative care consult?  Neurosurgery consult pending,  We also discussed goals of inpatient rehabilitation. Ideally patient would be able to assist with sliding board transfers in and out of bed to wheelchair. However, it is quite possible that the patient may require Harrel Lemon lift, even upon discharge from inpatient rehabilitation.  Patient's daughter states that several family members would be available to assist, hired caregiver would also be needed and home health or hospice care would also supplement.  Do not think patient is ready for inpatient rehabilitation today. Multiple issues need to be addressed.

## 2016-07-08 NOTE — Progress Notes (Signed)
[facility] Progress Note    Assessment/ Plan:   1.  Acute kidney injury superimposed on chronic kidney disease: I suspect CKD secondary to HTN.  Renal US with cortical thinning.  I don't think a large diagnostic workup is necessary.  UP/C won't be accurate because of CBI.  AKI likely due to relative hypotension in setting of acute illness and some NSAID use.  [place] doesn't indicate renal infarct. Cr basically back to baseline. Avoid IV contrast, fleets enemas, NSAIDs, mag citrate, PPIs. Doesn't need to follow with [place] in clinic unless Cr uptrends.  We will sign off.  2.  Bilateral LE weakness: ? suspected spinal cord infarct, need to repeat thoracic spine MRI  3.  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  Also if he keeps having hematuria, an agent with easy reversibility (ie Coumadin) should be considered too.  4.  Gross hematuria: Likely due to known bladder lesion, don't suspect this is related to GN.  Per urology.  CBI stopped.  5.  HTN: hold BP meds in setting of soft BP s which are now trending more toward the normotensive side.  6.  CVA: L hippocampal infarct.  TCDs with bubbles pending.  Subjective:    Down for thoracic MRI./  Discussed case with pt's dtr (DR Carol Ada)   Objective:   BP (!) 118/47 (BP Location: Left Arm)   Pulse 79   Temp 98.4 F (36.9 C) (Axillary)   Resp 17   Ht '5\' 10"'$  (1.778 m)   Wt 86 kg (189 lb 9.6 oz)   SpO2 96%   BMI 27.20 kg/m   Intake/Output Summary (Last 24 hours) at 07/08/16 1358 Last data filed at 07/08/16 1300  Gross per 24 hour  Intake              285 ml  Output             1551 ml  Net            -1266 ml   Weight change:   Physical Exam:  Unavailable for exam  Imaging: Mr Brain Wo Contrast  Result Date:  07/06/2016 CLINICAL DATA:  Acute lower extremity weakness, LEFT back pain and falls. History of prostate cancer, melanoma. EXAM: MRI HEAD WITHOUT CONTRAST MRI CERVICAL SPINE WITHOUT CONTRAST TECHNIQUE: Multiplanar, multiecho pulse sequences of the brain and surrounding structures, and cervical spine, to include the craniocervical junction and cervicothoracic junction, were obtained without intravenous contrast. Axial T2 sequence through cervical spine not obtained, patient was unable to tolerate further imaging. MRI thoracic spine performed at this time. COMPARISON:  CT HEAD July 05, 2016 FINDINGS: MRI HEAD FINDINGS- mildly motion degraded examination. BRAIN: Focal reduced diffusion LEFT temporal lobe/ hippocampus with low ADC values. No susceptibility artifact to suggest hemorrhage. The ventricles and sulci are normal for patient's age. Scattered subcentimeter supratentorial white matter FLAIR T2 hyperintensities are less than expected for age compatible with mild chronic small vessel ischemic disease. Old bilateral basal ganglia lacunar infarcts. No suspicious parenchymal signal, masses or mass effect. No abnormal extra-axial fluid collections. VASCULAR: Normal major intracranial vascular flow voids present at skull base. SKULL AND UPPER CERVICAL SPINE: No abnormal sellar expansion. No suspicious calvarial bone marrow signal. Craniocervical junction maintained. SINUSES/ORBITS: The mastoid air-cells and included paranasal  sinuses are well-aerated. The included ocular globes and orbital contents are non-suspicious. Status post bilateral ocular lens implants and scleral banding. OTHER: None. MRI CERVICAL SPINE FINDINGS- moderate to severely motion degraded sagittal STIR and axial T2 merge ALIGNMENT: Straightened cervical lordosis.  No malalignment. VERTEBRAE/DISCS: Vertebral bodies are intact. Moderate C4-5 through C6-7 disc height loss with proportional subacute on chronic discogenic endplate changes. Disc  desiccation all cervical levels. No suspicious or acute bone marrow signal. CORD:Mildly expansile T2 bright signal at T2, incompletely evaluated. No abnormal cervical spine cord signal or syrinx. POSTERIOR FOSSA, VERTEBRAL ARTERIES, PARASPINAL TISSUES: Mild bright interstitial STIR signal upper thoracic paraspinal soft tissues. Limited overall assessment. DISC LEVELS: Limited assessment due to motion. Moderate to severe canal stenosis C4-5 due to broad-based disc bulge, moderate at C3-4. Nondiagnostic evaluation of the neural foramen. IMPRESSION: MRI HEAD: Mildly motion degraded examination. Acute small LEFT temporal lobe/ hippocampal infarct, less likely infectious etiology. Old basal ganglia lacunar infarcts and mild chronic small vessel ischemic disease. MRI CERVICAL SPINE: Abnormal thoracic spinal cord signal partially imaged at T2. Mild suspected paraspinal muscle strain thoracic spine. Recommend MRI thoracic spine with and without contrast when patient is able to tolerate further imaging. Limited four sequence MRI, motion degraded. Moderate to severe canal stenosis C4-5 suspected, moderate at C3-4. Nondiagnostic assessment of the neural foramen. Electronically Signed   By: Elon Alas M.D.   On: 07/06/2016 19:33   Mr Cervical Spine Wo Contrast  Result Date: 07/08/2016 CLINICAL DATA:  Episode of weakness and numbness in the lower extremities bilaterally 07/03/2016. Acute/subacute left temporal lobe infarct. Abnormal signal within the upper thoracic spinal cord on the cervical spine MRI. The examination had to be discontinued prior to completion due to patient refusal for further imaging. EXAM: MRI CERVICAL AND THORACIC SPINE WITHOUT CONTRAST TECHNIQUE: Multiplanar and multiecho pulse sequences of the cervical spine, to include the craniocervical junction and cervicothoracic junction, and thoracic spine, were obtained without intravenous contrast. COMPARISON:  MRI of cervical spine 07/06/2016.  FINDINGS: MRI CERVICAL SPINE FINDINGS Alignment: AP alignment is anatomic. There straightening of the normal cervical lordosis. Vertebrae: Chronic endplate marrow changes are again noted at C4-5 and on the right at C6-7. Vertebral body heights are maintained. Cord: Cervical spinal cord signal is normal. Posterior Fossa, vertebral arteries, paraspinal tissues: The craniocervical junction is normal. The visualized intracranial contents are normal. Flow is present in the vertebral arteries bilaterally. Disc levels: Disc disease is not significantly changed. C2-3: Moderate right foraminal narrowing is due to uncovertebral and facet spurring. C3-4: A broad-based disc osteophyte complex and facet hypertrophy contributes to moderate left greater than right foraminal narrowing and moderate right central canal stenosis. C4-5: Moderate to severe central canal stenosis is present. There is distortion of the spinal cord. The canal is narrowed to 7 mm. Severe foraminal stenosis is worse on the left. C5-6: A rightward disc osteophyte complex is present. There is partial effacement of the ventral CSF. Moderate right and mild left foraminal narrowing is present. C6-7: Uncovertebral spurring bilaterally is stable. Mild foraminal narrowing is worse on the left. C7-T1: Moderate facet hypertrophy is present bilaterally without significant stenosis. MRI THORACIC SPINE FINDINGS Alignment:  AP alignment is anatomic. Vertebrae: There is diffuse heterogeneous marrow signal. Abnormal signal is present diffusely within the T3 vertebral body involving the posterior elements bilaterally. There is abnormal signal on the right at T4 extending into the posterior elements. Epidural soft tissue extends cephalo caudad 4.3 cm from the mid body aches T2-3 the midbody of  T4. This results in significant central canal stenosis. There is abnormal signal within the spinal cord at this level. Extensive T2 signal changes are present within the soft tissues  posterior to the thoracic spine at this level as well. The lesion at T9 measures 3.1 x 1.9 cm on the right. A smaller superior right-sided lesion measures 9 mm. A 10.5 mm lesion is evident posteriorly and inferiorly on the left at T11. No other definite involvement of posterior elements is noted. There is also abnormal signal in the right pedicle of T4. Cord: Abnormal cord signal is present from the midbody of T2 through T4 associated with cord compression by extraosseous soft tissue mass. Paraspinal and other soft tissues: Abnormal signal is present within the posterior paraspinal soft tissues T2-4. Disc levels: No significant focal disc protrusion is present. Facet hypertrophy contributes to mild right foraminal narrowing at T2-3. The foramina are otherwise patent. No other significant central canal stenosis is present. IMPRESSION: 1. Multiple osseous lesions within the thoracic spine, most notably at T3, T4, T9, and T11, suggesting metastatic disease. Multiple myeloma is also considered. 2. Extraosseous soft tissue mass likely reflecting tumor extension at the T3 level results in severe central canal stenosis with mass effect on the spinal cord. Abnormal cord signal a a mix extends from the midbody of T2 through the midbody of T4 . Central cord tumor is considered less likely. 3. Soft tissue signal changes posteriorly at the T2 level and extending above. This may reflect tumor or infection. 4. No significant extraosseous extension at the T9 or T10 levels. 5. Stable multilevel spondylosis in the cervical spine. 6. Heterogeneous marrow signal of the cervical spine appears be mostly discogenic. Additional sites of metastatic disease are not excluded. 7. The patient refused further imaging with contrast. Critical Value/emergent results were called by telephone at the time of interpretation on 07/08/2016 at 11:51 am to Dr. Rosalin Hawking , who verbally acknowledged these results. Electronically Signed   By: San Morelle M.D.   On: 07/08/2016 12:23   Mr Cervical Spine Wo Contrast  Result Date: 07/06/2016 CLINICAL DATA:  Acute lower extremity weakness, LEFT back pain and falls. History of prostate cancer, melanoma. EXAM: MRI HEAD WITHOUT CONTRAST MRI CERVICAL SPINE WITHOUT CONTRAST TECHNIQUE: Multiplanar, multiecho pulse sequences of the brain and surrounding structures, and cervical spine, to include the craniocervical junction and cervicothoracic junction, were obtained without intravenous contrast. Axial T2 sequence through cervical spine not obtained, patient was unable to tolerate further imaging. MRI thoracic spine performed at this time. COMPARISON:  CT HEAD July 05, 2016 FINDINGS: MRI HEAD FINDINGS- mildly motion degraded examination. BRAIN: Focal reduced diffusion LEFT temporal lobe/ hippocampus with low ADC values. No susceptibility artifact to suggest hemorrhage. The ventricles and sulci are normal for patient's age. Scattered subcentimeter supratentorial white matter FLAIR T2 hyperintensities are less than expected for age compatible with mild chronic small vessel ischemic disease. Old bilateral basal ganglia lacunar infarcts. No suspicious parenchymal signal, masses or mass effect. No abnormal extra-axial fluid collections. VASCULAR: Normal major intracranial vascular flow voids present at skull base. SKULL AND UPPER CERVICAL SPINE: No abnormal sellar expansion. No suspicious calvarial bone marrow signal. Craniocervical junction maintained. SINUSES/ORBITS: The mastoid air-cells and included paranasal sinuses are well-aerated. The included ocular globes and orbital contents are non-suspicious. Status post bilateral ocular lens implants and scleral banding. OTHER: None. MRI CERVICAL SPINE FINDINGS- moderate to severely motion degraded sagittal STIR and axial T2 merge ALIGNMENT: Straightened cervical lordosis.  No malalignment. VERTEBRAE/DISCS:  Vertebral bodies are intact. Moderate C4-5 through C6-7 disc height  loss with proportional subacute on chronic discogenic endplate changes. Disc desiccation all cervical levels. No suspicious or acute bone marrow signal. CORD:Mildly expansile T2 bright signal at T2, incompletely evaluated. No abnormal cervical spine cord signal or syrinx. POSTERIOR FOSSA, VERTEBRAL ARTERIES, PARASPINAL TISSUES: Mild bright interstitial STIR signal upper thoracic paraspinal soft tissues. Limited overall assessment. DISC LEVELS: Limited assessment due to motion. Moderate to severe canal stenosis C4-5 due to broad-based disc bulge, moderate at C3-4. Nondiagnostic evaluation of the neural foramen. IMPRESSION: MRI HEAD: Mildly motion degraded examination. Acute small LEFT temporal lobe/ hippocampal infarct, less likely infectious etiology. Old basal ganglia lacunar infarcts and mild chronic small vessel ischemic disease. MRI CERVICAL SPINE: Abnormal thoracic spinal cord signal partially imaged at T2. Mild suspected paraspinal muscle strain thoracic spine. Recommend MRI thoracic spine with and without contrast when patient is able to tolerate further imaging. Limited four sequence MRI, motion degraded. Moderate to severe canal stenosis C4-5 suspected, moderate at C3-4. Nondiagnostic assessment of the neural foramen. Electronically Signed   By: Elon Alas M.D.   On: 07/06/2016 19:33   Mr Thoracic Spine Wo Contrast  Result Date: 07/08/2016 CLINICAL DATA:  Episode of weakness and numbness in the lower extremities bilaterally 07/03/2016. Acute/subacute left temporal lobe infarct. Abnormal signal within the upper thoracic spinal cord on the cervical spine MRI. The examination had to be discontinued prior to completion due to patient refusal for further imaging. EXAM: MRI CERVICAL AND THORACIC SPINE WITHOUT CONTRAST TECHNIQUE: Multiplanar and multiecho pulse sequences of the cervical spine, to include the craniocervical junction and cervicothoracic junction, and thoracic spine, were obtained without  intravenous contrast. COMPARISON:  MRI of cervical spine 07/06/2016. FINDINGS: MRI CERVICAL SPINE FINDINGS Alignment: AP alignment is anatomic. There straightening of the normal cervical lordosis. Vertebrae: Chronic endplate marrow changes are again noted at C4-5 and on the right at C6-7. Vertebral body heights are maintained. Cord: Cervical spinal cord signal is normal. Posterior Fossa, vertebral arteries, paraspinal tissues: The craniocervical junction is normal. The visualized intracranial contents are normal. Flow is present in the vertebral arteries bilaterally. Disc levels: Disc disease is not significantly changed. C2-3: Moderate right foraminal narrowing is due to uncovertebral and facet spurring. C3-4: A broad-based disc osteophyte complex and facet hypertrophy contributes to moderate left greater than right foraminal narrowing and moderate right central canal stenosis. C4-5: Moderate to severe central canal stenosis is present. There is distortion of the spinal cord. The canal is narrowed to 7 mm. Severe foraminal stenosis is worse on the left. C5-6: A rightward disc osteophyte complex is present. There is partial effacement of the ventral CSF. Moderate right and mild left foraminal narrowing is present. C6-7: Uncovertebral spurring bilaterally is stable. Mild foraminal narrowing is worse on the left. C7-T1: Moderate facet hypertrophy is present bilaterally without significant stenosis. MRI THORACIC SPINE FINDINGS Alignment:  AP alignment is anatomic. Vertebrae: There is diffuse heterogeneous marrow signal. Abnormal signal is present diffusely within the T3 vertebral body involving the posterior elements bilaterally. There is abnormal signal on the right at T4 extending into the posterior elements. Epidural soft tissue extends cephalo caudad 4.3 cm from the mid body aches T2-3 the midbody of T4. This results in significant central canal stenosis. There is abnormal signal within the spinal cord at this  level. Extensive T2 signal changes are present within the soft tissues posterior to the thoracic spine at this level as well. The lesion at T9 measures 3.1  x 1.9 cm on the right. A smaller superior right-sided lesion measures 9 mm. A 10.5 mm lesion is evident posteriorly and inferiorly on the left at T11. No other definite involvement of posterior elements is noted. There is also abnormal signal in the right pedicle of T4. Cord: Abnormal cord signal is present from the midbody of T2 through T4 associated with cord compression by extraosseous soft tissue mass. Paraspinal and other soft tissues: Abnormal signal is present within the posterior paraspinal soft tissues T2-4. Disc levels: No significant focal disc protrusion is present. Facet hypertrophy contributes to mild right foraminal narrowing at T2-3. The foramina are otherwise patent. No other significant central canal stenosis is present. IMPRESSION: 1. Multiple osseous lesions within the thoracic spine, most notably at T3, T4, T9, and T11, suggesting metastatic disease. Multiple myeloma is also considered. 2. Extraosseous soft tissue mass likely reflecting tumor extension at the T3 level results in severe central canal stenosis with mass effect on the spinal cord. Abnormal cord signal a a mix extends from the midbody of T2 through the midbody of T4 . Central cord tumor is considered less likely. 3. Soft tissue signal changes posteriorly at the T2 level and extending above. This may reflect tumor or infection. 4. No significant extraosseous extension at the T9 or T10 levels. 5. Stable multilevel spondylosis in the cervical spine. 6. Heterogeneous marrow signal of the cervical spine appears be mostly discogenic. Additional sites of metastatic disease are not excluded. 7. The patient refused further imaging with contrast. Critical Value/emergent results were called by telephone at the time of interpretation on 07/08/2016 at 11:51 am to Dr. Rosalin Hawking , who verbally  acknowledged these results. Electronically Signed   By: San Morelle M.D.   On: 07/08/2016 12:23    Labs: BMET  Recent Labs Lab 07/03/16 9937 07/04/16 1696 07/05/16 0528 07/06/16 0321 07/07/16 0337 07/08/16 0334  NA 138 139 135 137 137 138  K 3.9 4.2 4.3 4.3 4.0 3.9  CL 109 109 107 110 110 112*  CO2 22 19* 18* 19* 17* 21*  GLUCOSE 128* 103* 121* 110* 127* 104*  BUN 24* 34* 48* 66* 74* 59*  CREATININE 1.06 2.24* 2.38* 2.61* 2.09* 1.17  CALCIUM 8.9 8.8* 8.4* 7.9* 8.2* 8.2*   CBC  Recent Labs Lab 07/03/16 0819  07/04/16 0958 07/05/16 0528 07/06/16 0321 07/07/16 1308 07/08/16 0334  WBC 12.6*  --  14.1* 15.4* 14.3*  --  12.7*  NEUTROABS 11.0*  --  11.7*  --   --   --   --   HGB 14.4  --  13.8 11.5* 9.5* 8.8* 8.2*  HCT 42.8  < > 40.2 34.5* 28.9* 27.1* 25.4*  MCV 88.6  --  88.9 90.8 89.5  --  91.7  PLT 130*  --  123* 159 178  --  224  < > = values in this interval not displayed.  Medications:    . apixaban  2.5 mg Oral BID  . atorvastatin  40 mg Oral q1800  . cyanocobalamin  1,000 mcg Intramuscular Daily  . dexamethasone  10 mg Intravenous Q6H  . dorzolamide-timolol  1 drop Right Eye BID  . fentaNYL (SUBLIMAZE) injection  50 mcg Intravenous Once  . iopamidol      . latanoprost  1 drop Right Eye QHS  . mouth rinse  15 mL Mouth Rinse q12n4p  . Vitamin D (Ergocalciferol)  50,000 Units Oral Q7 days      Madelon Lips, MD [facility]  pgr [phone] 07/08/2016, 1:58 PM

## 2016-07-08 NOTE — Discharge Instructions (Signed)
Information on my medicine - ELIQUIS (apixaban)  This medication education was reviewed with me or my healthcare representative as part of my discharge preparation.  Why was Eliquis prescribed for you? Eliquis was prescribed to treat blood clots that may have been found in the veins of your legs (deep vein thrombosis) or in your lungs (pulmonary embolism) and to reduce the risk of them occurring again.  What do You need to know about Eliquis ? The dose decided on by your provider is ONE 2.5 mg tablet taken TWICE daily.  Eliquis may be taken with or without food.   Try to take the dose about the same time in the morning and in the evening. If you have difficulty swallowing the tablet whole please discuss with your pharmacist how to take the medication safely.  Take Eliquis exactly as prescribed and DO NOT stop taking Eliquis without talking to the doctor who prescribed the medication.  Stopping may increase your risk of developing a new blood clot.  Refill your prescription before you run out.  After discharge, you should have regular check-up appointments with your healthcare provider that is prescribing your Eliquis.    What do you do if you miss a dose? If a dose of ELIQUIS is not taken at the scheduled time, take it as soon as possible on the same day and twice-daily administration should be resumed. The dose should not be doubled to make up for a missed dose.  Important Safety Information A possible side effect of Eliquis is bleeding. You should call your healthcare provider right away if you experience any of the following: ? Bleeding from an injury or your nose that does not stop. ? Unusual colored urine (red or dark brown) or unusual colored stools (red or black). ? Unusual bruising for unknown reasons. ? A serious fall or if you hit your head (even if there is no bleeding).  Some medicines may interact with Eliquis and might increase your risk of bleeding or clotting  while on Eliquis. To help avoid this, consult your healthcare provider or pharmacist prior to using any new prescription or non-prescription medications, including herbals, vitamins, non-steroidal anti-inflammatory drugs (NSAIDs) and supplements.  This website has more information on Eliquis (apixaban): http://[email or web address].com/eliquis/home

## 2016-07-08 NOTE — Care Management Note (Signed)
Case Management Note Marvetta Gibbons RN, BSN Unit 2W-Case Manager [phone]  Patient Details  Name: Luis Pierce MRN: [id number] Date of Birth: [birth year]  Subjective/Objective:   Pt admitted with PE, BLE weakness, hematuria with CBI                 Action/Plan: PTA pt lived at home- per PT eval CIR recommended- CIR consulted and following for possible admission- pending insurance approval.   Expected Discharge Date:                  Expected Discharge Plan:  IP Rehab Facility  In-House Referral:  Clinical Social Work  Discharge planning Services  CM Consult  Post Acute Care Choice:    Choice offered to:     DME Arranged:    DME Agency:     HH Arranged:    [place] Agency:     Status of Service:  In process, will continue to follow  If discussed at Long Length of Stay Meetings, dates discussed:  6/21  Discharge Disposition:   Additional Comments:  Dawayne Patricia, RN 07/08/2016, 12:36 PM

## 2016-07-08 NOTE — Progress Notes (Signed)
I discussed with Dr. Karleen Hampshire and Dr. Letta Pate. Noted MRI findings with neurosurgery consult pending. I do have insurance approval for an inpt rehab admit. I will follow up tomorrow for overall plan and assist with coordinating care. [phone]

## 2016-07-08 NOTE — Progress Notes (Addendum)
STROKE TEAM PROGRESS NOTE   SUBJECTIVE (INTERVAL HISTORY) His daughter is at the bedside.  The patient is more awake alert with mild confusion. Daughter stated that pt did not sleep last night, had sleep wake cycle disturbance. However, he had conversation with daughter this morning and seems cognitively intact. MRI spine and pan CT as well as TCD bubble study pending.    OBJECTIVE Temp:  [97.9 F (36.6 C)-98.6 F (37 C)] 98.6 F (37 C) (06/21 0457) Pulse Rate:  [80-90] 80 (06/21 0457) Cardiac Rhythm: Normal sinus rhythm (06/20 1900) Resp:  [17-18] 18 (06/21 0457) BP: (118-126)/(57-64) 118/64 (06/21 0457) SpO2:  [96 %-100 %] 100 % (06/21 0457)  CBC:  Recent Labs Lab 07/03/16 0819  07/04/16 0958  07/06/16 0321 07/07/16 1308 07/08/16 0334  WBC 12.6*  --  14.1*  < > 14.3*  --  12.7*  NEUTROABS 11.0*  --  11.7*  --   --   --   --   HGB 14.4  --  13.8  < > 9.5* 8.8* 8.2*  HCT 42.8  < > 40.2  < > 28.9* 27.1* 25.4*  MCV 88.6  --  88.9  < > 89.5  --  91.7  PLT 130*  --  123*  < > 178  --  224  < > = values in this interval not displayed.  Basic Metabolic Panel:   Recent Labs Lab 07/07/16 0337 07/08/16 0334  NA 137 138  K 4.0 3.9  CL 110 112*  CO2 17* 21*  GLUCOSE 127* 104*  BUN 74* 59*  CREATININE 2.09* 1.17  CALCIUM 8.2* 8.2*    Lipid Panel:     Component Value Date/Time   CHOL 102 07/08/2016 0334   TRIG 128 07/08/2016 0334   HDL 24 (L) 07/08/2016 0334   CHOLHDL 4.3 07/08/2016 0334   VLDL 26 07/08/2016 0334   LDLCALC 52 07/08/2016 0334   HgbA1c: No results found for: HGBA1C Urine Drug Screen: No results found for: LABOPIA, COCAINSCRNUR, LABBENZ, AMPHETMU, THCU, LABBARB  Alcohol Level No results found for: [place] I have personally reviewed the radiological images below and agree with the radiology interpretations.  CT Angio Chest Aorta W and/or Wo Contrast 07/03/2016 IMPRESSION: 1. Right-sided pulmonary embolism without findings for right  heartstrain. 2. Tortuosity, ectasia and atherosclerotic calcifications involving the thoracic aorta but no dissection. 3. Three-vessel coronary artery calcifications and surgical changes from coronary artery bypass surgery. 4. Bibasilar atelectasis but no definite infiltrates, edema or effusions. Aortic Atherosclerosis (ICD10-I70.0).  Vas [place] Lower Extremity Venous Duplex 07/04/2016 Summary:- Findings consistent with acute deep vein thrombosis involving the right peroneal vein. - No evidence of deep vein thrombosis involving the left lower extremity.  2D Echo 07/04/2016 Impressions: - LVEF 60-65%, moderate LVH, normal wall motion, grade 1 DD with normal LV filling pressure, dilated aortic root and ascending aorta, normal LA size, trivial TR, RVSP 18 mmHg, normal IVC.  Ct Head Wo Contrast 07/05/2016 IMPRESSION: In this patient who is anticoagulated, with altered mental status, there are no acute areas of parenchymal or subarachnoid hemorrhage. Stable appearing atrophy with extensive white matter disease. BILATERAL ocular banding procedures, as discussed above.   Mr Brain 82 Contrast Mr Cervical Spine Wo Contrast 07/06/2016 IMPRESSION: MRI HEAD: Mildly motion degraded examination. Acute small LEFT temporal lobe/ hippocampal infarct, less likely infectious etiology. Old basal ganglia lacunar infarcts and mild chronic small vessel ischemic disease.  MRI CERVICAL SPINE: Abnormal thoracic spinal cord signal partially imaged at T2.  Mild suspected paraspinal muscle strain thoracic spine. Recommend MRI thoracic spine with and without contrast when patient is able to tolerate further imaging. Limited four sequence MRI, motion degraded. Moderate to severe canal stenosis C4-5 suspected, moderate at C3-4. Nondiagnostic assessment of the neural foramen.  US Pelvis Limited 07/06/2016 IMPRESSION: Minimally distended bladder with some intraluminal debris. Dependent hyperdensity measuring 1.6 x 0.8 x 2.0 cm  has no definite posterior shadowing. This may reflect small stones or clot.  Mr Cervical and thoracic Spine Wo Contrast 07/08/2016 IMPRESSION: 1. Multiple osseous lesions within the thoracic spine, most notably at T3, T4, T9, and T11, suggesting metastatic disease. Multiple myeloma is also considered. 2. Extraosseous soft tissue mass likely reflecting tumor extension at the T3 level results in severe central canal stenosis with mass effect on the spinal cord. Abnormal cord signal a a mix extends from the midbody of T2 through the midbody of T4 . Central cord tumor is considered less likely. 3. Soft tissue signal changes posteriorly at the T2 level and extending above. This may reflect tumor or infection. 4. No significant extraosseous extension at the T9 or T10 levels. 5. Stable multilevel spondylosis in the cervical spine. 6. Heterogeneous marrow signal of the cervical spine appears be mostly discogenic. Additional sites of metastatic disease are not excluded. 7. The patient refused further imaging with contrast.   CT pelvis and abdomen contrast  pending   Transcranial Doppler w Bubbles pending    PHYSICAL EXAM  Temp:  [97.9 F (36.6 C)-98.6 F (37 C)] 98.6 F (37 C) (06/21 0457) Pulse Rate:  [80-90] 80 (06/21 0457) Resp:  [17-18] 18 (06/21 0457) BP: (118-126)/(57-64) 118/64 (06/21 0457) SpO2:  [96 %-100 %] 100 % (06/21 0457)  General - Well nourished, well developed, drowsy sleepy but arousable.  Ophthalmologic - Fundi not visualized due to noncooperation.  Cardiovascular - Regular rate and rhythm.  Neuro - drowsy sleepy but arousable, orientated to place and people and year, however not orientated to month. Mild dysarthria, no aphasia or neglect. PERRL, left eye total blindness, right eye able to barely count fingers, no significant facial asymmetry, tongue in midline. Bilateral upper extremity equal strength with normal sensation. However, patient does have a T4-T6 sensory level.  Bilateral lower extremity 0/5, hypotonia, with left positive Babinski and triple reflex. Right Babinski negative. Bilateral lower extremity diminished vibration and proprioception. Finger-to-nose grossly intact bilaterally. Gait not able to test.   ASSESSMENT/PLAN Luis Pierce is a 83 y.o. male with history of left-eye blindness, chronic kidney disease, kidney stones and urinary incontinence, colon cancer ([place]), coronary artery disease, diverticulosis, DJD (degenerative joint disease), glaucoma, hyperlipemia, hypertension, melanoma of back ([place]), mild mitral regurgitation, prostate cancer ([place]), and urinary incontinence presenting with bilateral lower extremity weakness and numbness, slurred speech, and chest pain. He did not receive IV t-PA due to arriving outside of the treatment window.  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    Sensory level T4-T6 with sensation lost in all modalities.   BLE 0/5 with spinal shock except left babinski and triple reflex  NSG consultation called - [name] aware and recommend decadron 26m IV q6h.   CTA chest did not show any malignancy in the chest  CT abdomen/pelvis with contrast pending  May consider oncology consultation soon  Stroke: Acute small left PCA and left MCA/ACA territory  punctate infarcts, emboli pattern. DDx includes: 1. paradoxical emboli due to right DVT.  However, PFO has to be present 2. Hypercoagulable state due to malignancy. Patient had multiple extensive cancer history, may need to do pan CT to rule out metastasis  CT head: no stroke  MRI head - Acute small LEFT temporal lobe/ hippocampal infarct as well as left posterior frontal infact  Carotid Doppler - not ordered as it will not change management.  2D Echo: EF 60-65%. No source of embolus.  TCD  bubble study pending to evaluate PFO  CTA chest did not show malignancy in the chest  CT abdomen/pelvis pending  LDL 52  Heparin IV for VTE prophylaxis Diet regular Room service appropriate? Yes; Fluid consistency: Thin  aspirin 81 mg daily prior to admission, was on heparin IV and now switched to eliquis 2.24m bid. However, for DVT/PE treatment, eliquis dose is too low.   Patient counseled to be compliant with his antithrombotic medications  Ongoing aggressive stroke risk factor management  Therapy recommendations: CIR  Disposition:  pending  DVT and PE  LE venous doppler confirmed right DVT  CTA chest confirmed right PE  Was on heparin drip and now switched to eliquis 2.528mbid. However, for DVT/PE treatment, eliquis dose is too low.  Hematuria - ? Bladder cancer  Gross hematuria  heparin IV discontinued and now on eliquis 2.2m44mid. May be helpful for hematuria, but for DVT/PE treatment, the dose is too low.  On bladder irrigation   B12 deficiency  B12 level < 50  On B12 injection daily  Can not explain pt profound leg weakness  AKI, resolved  Cre 1.06->2.38->2.61->2.09->1.17  On IVF @ 75cc/h  Close monitoring  Extensive hx of cancer  Colon cancer s/p colectomy, stage I  Melanoma of the back s/p resection  Prostate cancer s/p TRUP  ? Bladder cancer   CTA chest did not show malignancy in the chest  CT abdomen/pelvis pending  Hyperlipidemia  Home meds: atorvastatin 21m81m daily, resumed in hospital  LDL 52, goal < 70  Continue statin at discharge  Other Stroke Risk Factors  Advanced age  ETOH use, advised to drink no more than 2 drink(s) a day  Coronary artery disease  Other Active Problems  Blind left eye    Urinary incontinence   Hospital day # 5   JindRosalin Hawking PhD Stroke Neurology 07/08/2016 1:03 PM  To contact Stroke Continuity provider, please refer to [email or web address], contact General Neurology

## 2016-07-08 NOTE — Progress Notes (Addendum)
ANTICOAGULATION CONSULT NOTE - Follow Up Consult  Pharmacy Consult for Heparin > apixaban Indication: pulmonary embolus  No Known Allergies  Patient Measurements: Height: 5\' 10"  (177.8 cm) Weight: 189 lb 9.6 oz (86 kg) IBW/kg (Calculated) : 73 Heparin Dosing Weight:  81.7 kg  Vital Signs: Temp: 98.6 F (37 C) (06/21 0457) Temp Source: Axillary (06/21 0457) BP: 118/64 (06/21 0457) Pulse Rate: 80 (06/21 0457)  Labs:  Recent Labs  07/06/16 0321 07/07/16 0337 07/07/16 1308 07/08/16 0334  HGB 9.5*  --  8.8* 8.2*  HCT 28.9*  --  27.1* 25.4*  PLT 178  --   --  224  HEPARINUNFRC 0.54 0.56  --  0.31  CREATININE 2.61* 2.09*  --  1.17    Estimated Creatinine Clearance: 42.5 mL/min (by C-G formula based on SCr of 1.17 mg/dL).  Assessment: 24 yom continuing on IV hep for acute PE. Hematuria continues but ok to continue heparin for now per Urology recommendations. CT head negative for bleed.  Heparin level remains therapeutic at 0.31 on 800 units/h - drifting towards bottom of range but hematuria has picked up per RN, drip has not been turned off and not IV line or bleed issues reported. Hg down 8.2, plt up to wnl. AKI - SCr trending down again today.  Goal of Therapy:  Heparin level 0.3-0.7 units/ml Monitor platelets by anticoagulation protocol: Yes   Plan:  Continue heparin at 800 units/hr - will not push rate with increased hematuria Daily heparin level, CBC Monitor closely for increased s/sx bleeding F/u long-term anticoagulation plans   Elicia Lamp, PharmD, BCPS Clinical Pharmacist Rx Phone # for today: [phone] After 3:30PM, please call Main Rx: [phone] 07/08/2016 8:27 AM    ADDENDUM:  Pharmacy consulted to transition from heparin to apixaban. Per discussion with MD, aware patient has PE, but due to advanced age and ongoing hematuria with low Hg, requests to start low-dose apixaban in this patient. Patient's daughter (who is MD) in agreement on dosing  strategy.   Plan: D/c heparin drip when 1st dose of apixaban given - communicated plan with RN Apixaban 2.5mg  PO BID - low dose on purpose due to bleed risk factors Monitor CBC, increased s/sx bleeding closely   Elicia Lamp, PharmD, BCPS Clinical Pharmacist Rx Phone # for today: [phone] After 3:30PM, please call Main Rx: #[id number] 07/08/2016 11:23 AM

## 2016-07-08 NOTE — Progress Notes (Addendum)
PROGRESS NOTE    Luis Pierce  GLO:[id number] DOB: [birth year] DOA: 07/03/2016 PCP: Lajean Manes, MD    Brief Narrative: 83 y/o male with PMH of HTN, CAD h/o CABG, Glaucoma, retinal detachment with poor vision, h/o colon, prostate cancer, urinary incontinence s/p artifical sphincter presented with generalized weakness, midline back pains and inability to walk. He was found to have acute PE and right  lower extremity DVT ,probably provoked from recent trip. He was started on IV heparin. Meanwhile neurology consulted for his generalized weakness and unable to ambulate, MRI of the brain and C spine obtained showing a small left temporal lobe infarct. MRI of the C  SPINE shows mod to severe spinal stenosis. Ne Meanwhile PT eval recommended CIR, awaiting insurance authorization .    Assessment & Plan:   Principal Problem:   Bilateral leg weakness Active Problems:   Pulmonary embolism (HCC)   Generalized weakness   Hypertension   Hyperlipidemia   Gross hematuria   Hypothermia   Dehydration   [name]   Urinary incontinence, male, stress   Pulmonary embolism on right [facility])   Thoracic back pain   Acute lower UTI   B12 deficiency   ARF (acute renal failure) (HCC)   Leg weakness   Cerebral embolism with cerebral infarction   Pressure injury of skin   DVT (deep vein thrombosis) in pregnancy (HCC)   Bilateral leg weakness:  Possibly from stroke, PE, DVT, and possibly spinal infarction and vitamin b12 deficiency.   MRI of the thoracic spine shows Multiple osseous lesions within the thoracic spine, most notably at T3, T4, T9, and T11, suggesting metastatic disease. Neuro surgery consulted for recommendations and started on IV steroids.    CT abd and pelvis shows 4.5 x 2.9 cm irregular lobulated mass appears to be involving the posterior and inferior wall of urinary bladder which is slightly enlarged compared to prior exam. This is concerning for invasive recurrent  prostate cancer,.  Bubble study shows small PFO.   PT/OT evaluation, recommending CIR.    Acute PE/ dvt:  No right ventricular strain.  On IV heparin, transitioning him to eliquis. Johney Maine Hematuria:  H/o stress urinary incontinence s/p artifical urinary sphincter placement.  H/o prostate cancer s/p prostatectomy. Urology consulted and foley catheter placed and the AUS deactivated.   US pelvis on 6/19 shows persistent clots and multiple stones. He will probably need outpatient cystoscopy.  Keep the foley in place.     Acute blood loss anemia:  From hematuria.  Baseline hemoglobin around 14 and dropped to 8.3.  Repeat H&H ordered this afternoon.  Transfuse to keep hemoglobin greater than 8.     Hypertension; well controlled.    Acute renal failure:  ? Pre renal azotemia vs NSAID use,  Slowly improving. US renal neg for hydronephrosis.  Nephrology consulted with recommendations. Renal function today is back at baseline.         DVT prophylaxis: heparin.  Code Status: full code.  Family Communication:discussed with daughter at bedside.  Disposition Plan: pending further evaluation.    Consultants:   Neurology  Urology     Procedures: bubble study  Mri of the thoracic spine.   CT abd and pelvis.    Antimicrobials: none.    Subjective: Sleeping comfortably, reports he is exhausted today.    Objective: Vitals:   07/07/16 1419 07/07/16 2032 07/08/16 0457 07/08/16 1323  BP: (!) 119/57 126/60 118/64 (!) 118/47  Pulse: 90  80  79  Resp: '17 18 18 17  '$ Temp: 98 F (36.7 C) 97.9 F (36.6 C) 98.6 F (37 C) 98.4 F (36.9 C)  TempSrc: Axillary Axillary Axillary Axillary  SpO2: 96% 97% 100% 96%  Weight:      Height:        Intake/Output Summary (Last 24 hours) at 07/08/16 1541 Last data filed at 07/08/16 1300  Gross per 24 hour  Intake              585 ml  Output             1251 ml  Net             -666 ml   Filed Weights   07/05/16  0420 07/06/16 0547 07/07/16 0500  Weight: 80.9 kg (178 lb 6.4 oz) 84.2 kg (185 lb 11.2 oz) 86 kg (189 lb 9.6 oz)    Examination:  General exam: appears comfortable.  Respiratory system: clear , no wheezing or rhonchi.  Cardiovascular system: s1s2, RRR.  Gastrointestinal system: abd, soft NT, ND BS+ Central nervous system: alert and oriented to place and person. Still lower extremity weakness.   Extremities: no pedal edema.  Skin: stage 1 pressure ulcer on the bottocks Psychiatry: mood normal.  Genitourinary:  Urine is darker than yesterday.     Data Reviewed: I have personally reviewed following labs and imaging studies  CBC:  Recent Labs Lab 07/03/16 0819  07/04/16 0958 07/05/16 0528 07/06/16 0321 07/07/16 1308 07/08/16 0334  WBC 12.6*  --  14.1* 15.4* 14.3*  --  12.7*  NEUTROABS 11.0*  --  11.7*  --   --   --   --   HGB 14.4  --  13.8 11.5* 9.5* 8.8* 8.2*  HCT 42.8  < > 40.2 34.5* 28.9* 27.1* 25.4*  MCV 88.6  --  88.9 90.8 89.5  --  91.7  PLT 130*  --  123* 159 178  --  224  < > = values in this interval not displayed. Basic Metabolic Panel:  Recent Labs Lab 07/04/16 0952 07/05/16 0528 07/06/16 0321 07/07/16 0337 07/08/16 0334  NA 139 135 137 137 138  K 4.2 4.3 4.3 4.0 3.9  CL 109 107 110 110 112*  CO2 19* 18* 19* 17* 21*  GLUCOSE 103* 121* 110* 127* 104*  BUN 34* 48* 66* 74* 59*  CREATININE 2.24* 2.38* 2.61* 2.09* 1.17  CALCIUM 8.8* 8.4* 7.9* 8.2* 8.2*   GFR: Estimated Creatinine Clearance: 42.5 mL/min (by C-G formula based on SCr of 1.17 mg/dL). Liver Function Tests:  Recent Labs Lab 07/03/16 0819 07/03/16 1803 07/04/16 0952  AST 32  --  27  ALT 17  --  16*  ALKPHOS 191*  --  187*  BILITOT 1.8* 1.4* 0.8  PROT 5.9*  --  5.4*  ALBUMIN 3.1*  --  2.8*   No results for input(s): LIPASE, AMYLASE in the last 168 hours. No results for input(s): AMMONIA in the last 168 hours. Coagulation Profile:  Recent Labs Lab 07/04/16 0952  INR 1.48    Cardiac Enzymes: No results for input(s): CKTOTAL, CKMB, CKMBINDEX, TROPONINI in the last 168 hours. BNP (last 3 results) No results for input(s): PROBNP in the last 8760 hours. HbA1C: No results for input(s): HGBA1C in the last 72 hours. CBG:  Recent Labs Lab 07/03/16 1516  GLUCAP 118*   Lipid Profile:  Recent Labs  07/08/16 0334  CHOL 102  HDL 24*  LDLCALC 52  TRIG 128  CHOLHDL 4.3   Thyroid Function Tests: No results for input(s): TSH, T4TOTAL, FREET4, T3FREE, THYROIDAB in the last 72 hours. Anemia Panel: No results for input(s): VITAMINB12, FOLATE, FERRITIN, TIBC, IRON, RETICCTPCT in the last 72 hours. Sepsis Labs: No results for input(s): PROCALCITON, LATICACIDVEN in the last 168 hours.  Recent Results (from the past 240 hour(s))  Urine culture     Status: Abnormal   Collection Time: 07/03/16  3:16 PM  Result Value Ref Range Status   Specimen Description URINE, RANDOM  Final   Special Requests NONE  Final   Culture <10,000 COLONIES/mL INSIGNIFICANT GROWTH (A)  Final   Report Status 07/04/2016 FINAL  Final  Culture, blood (Routine X 2) w Reflex to ID Panel     Status: None (Preliminary result)   Collection Time: 07/03/16  6:08 PM  Result Value Ref Range Status   Specimen Description BLOOD LEFT HAND  Final   Special Requests IN PEDIATRIC BOTTLE Blood Culture adequate volume  Final   Culture NO GROWTH 4 DAYS  Final   Report Status PENDING  Incomplete  Culture, blood (Routine X 2) w Reflex to ID Panel     Status: None (Preliminary result)   Collection Time: 07/03/16  6:13 PM  Result Value Ref Range Status   Specimen Description BLOOD RIGHT ANTECUBITAL  Final   Special Requests IN PEDIATRIC BOTTLE Blood Culture adequate volume  Final   Culture NO GROWTH 4 DAYS  Final   Report Status PENDING  Incomplete         Radiology Studies: Mr Brain 34 Contrast  Result Date: 07/06/2016 CLINICAL DATA:  Acute lower extremity weakness, LEFT back pain and falls.  History of prostate cancer, melanoma. EXAM: MRI HEAD WITHOUT CONTRAST MRI CERVICAL SPINE WITHOUT CONTRAST TECHNIQUE: Multiplanar, multiecho pulse sequences of the brain and surrounding structures, and cervical spine, to include the craniocervical junction and cervicothoracic junction, were obtained without intravenous contrast. Axial T2 sequence through cervical spine not obtained, patient was unable to tolerate further imaging. MRI thoracic spine performed at this time. COMPARISON:  CT HEAD July 05, 2016 FINDINGS: MRI HEAD FINDINGS- mildly motion degraded examination. BRAIN: Focal reduced diffusion LEFT temporal lobe/ hippocampus with low ADC values. No susceptibility artifact to suggest hemorrhage. The ventricles and sulci are normal for patient's age. Scattered subcentimeter supratentorial white matter FLAIR T2 hyperintensities are less than expected for age compatible with mild chronic small vessel ischemic disease. Old bilateral basal ganglia lacunar infarcts. No suspicious parenchymal signal, masses or mass effect. No abnormal extra-axial fluid collections. VASCULAR: Normal major intracranial vascular flow voids present at skull base. SKULL AND UPPER CERVICAL SPINE: No abnormal sellar expansion. No suspicious calvarial bone marrow signal. Craniocervical junction maintained. SINUSES/ORBITS: The mastoid air-cells and included paranasal sinuses are well-aerated. The included ocular globes and orbital contents are non-suspicious. Status post bilateral ocular lens implants and scleral banding. OTHER: None. MRI CERVICAL SPINE FINDINGS- moderate to severely motion degraded sagittal STIR and axial T2 merge ALIGNMENT: Straightened cervical lordosis.  No malalignment. VERTEBRAE/DISCS: Vertebral bodies are intact. Moderate C4-5 through C6-7 disc height loss with proportional subacute on chronic discogenic endplate changes. Disc desiccation all cervical levels. No suspicious or acute bone marrow signal. CORD:Mildly  expansile T2 bright signal at T2, incompletely evaluated. No abnormal cervical spine cord signal or syrinx. POSTERIOR FOSSA, VERTEBRAL ARTERIES, PARASPINAL TISSUES: Mild bright interstitial STIR signal upper thoracic paraspinal soft tissues. Limited overall assessment. DISC LEVELS: Limited assessment due to motion. Moderate to severe canal stenosis C4-5 due to  broad-based disc bulge, moderate at C3-4. Nondiagnostic evaluation of the neural foramen. IMPRESSION: MRI HEAD: Mildly motion degraded examination. Acute small LEFT temporal lobe/ hippocampal infarct, less likely infectious etiology. Old basal ganglia lacunar infarcts and mild chronic small vessel ischemic disease. MRI CERVICAL SPINE: Abnormal thoracic spinal cord signal partially imaged at T2. Mild suspected paraspinal muscle strain thoracic spine. Recommend MRI thoracic spine with and without contrast when patient is able to tolerate further imaging. Limited four sequence MRI, motion degraded. Moderate to severe canal stenosis C4-5 suspected, moderate at C3-4. Nondiagnostic assessment of the neural foramen. Electronically Signed   By: Awilda Metro M.D.   On: 07/06/2016 19:33   Mr Cervical Spine Wo Contrast  Result Date: 07/08/2016 CLINICAL DATA:  Episode of weakness and numbness in the lower extremities bilaterally 07/03/2016. Acute/subacute left temporal lobe infarct. Abnormal signal within the upper thoracic spinal cord on the cervical spine MRI. The examination had to be discontinued prior to completion due to patient refusal for further imaging. EXAM: MRI CERVICAL AND THORACIC SPINE WITHOUT CONTRAST TECHNIQUE: Multiplanar and multiecho pulse sequences of the cervical spine, to include the craniocervical junction and cervicothoracic junction, and thoracic spine, were obtained without intravenous contrast. COMPARISON:  MRI of cervical spine 07/06/2016. FINDINGS: MRI CERVICAL SPINE FINDINGS Alignment: AP alignment is anatomic. There straightening  of the normal cervical lordosis. Vertebrae: Chronic endplate marrow changes are again noted at C4-5 and on the right at C6-7. Vertebral body heights are maintained. Cord: Cervical spinal cord signal is normal. Posterior Fossa, vertebral arteries, paraspinal tissues: The craniocervical junction is normal. The visualized intracranial contents are normal. Flow is present in the vertebral arteries bilaterally. Disc levels: Disc disease is not significantly changed. C2-3: Moderate right foraminal narrowing is due to uncovertebral and facet spurring. C3-4: A broad-based disc osteophyte complex and facet hypertrophy contributes to moderate left greater than right foraminal narrowing and moderate right central canal stenosis. C4-5: Moderate to severe central canal stenosis is present. There is distortion of the spinal cord. The canal is narrowed to 7 mm. Severe foraminal stenosis is worse on the left. C5-6: A rightward disc osteophyte complex is present. There is partial effacement of the ventral CSF. Moderate right and mild left foraminal narrowing is present. C6-7: Uncovertebral spurring bilaterally is stable. Mild foraminal narrowing is worse on the left. C7-T1: Moderate facet hypertrophy is present bilaterally without significant stenosis. MRI THORACIC SPINE FINDINGS Alignment:  AP alignment is anatomic. Vertebrae: There is diffuse heterogeneous marrow signal. Abnormal signal is present diffusely within the T3 vertebral body involving the posterior elements bilaterally. There is abnormal signal on the right at T4 extending into the posterior elements. Epidural soft tissue extends cephalo caudad 4.3 cm from the mid body aches T2-3 the midbody of T4. This results in significant central canal stenosis. There is abnormal signal within the spinal cord at this level. Extensive T2 signal changes are present within the soft tissues posterior to the thoracic spine at this level as well. The lesion at T9 measures 3.1 x 1.9 cm on  the right. A smaller superior right-sided lesion measures 9 mm. A 10.5 mm lesion is evident posteriorly and inferiorly on the left at T11. No other definite involvement of posterior elements is noted. There is also abnormal signal in the right pedicle of T4. Cord: Abnormal cord signal is present from the midbody of T2 through T4 associated with cord compression by extraosseous soft tissue mass. Paraspinal and other soft tissues: Abnormal signal is present within the posterior paraspinal  soft tissues T2-4. Disc levels: No significant focal disc protrusion is present. Facet hypertrophy contributes to mild right foraminal narrowing at T2-3. The foramina are otherwise patent. No other significant central canal stenosis is present. IMPRESSION: 1. Multiple osseous lesions within the thoracic spine, most notably at T3, T4, T9, and T11, suggesting metastatic disease. Multiple myeloma is also considered. 2. Extraosseous soft tissue mass likely reflecting tumor extension at the T3 level results in severe central canal stenosis with mass effect on the spinal cord. Abnormal cord signal a a mix extends from the midbody of T2 through the midbody of T4 . Central cord tumor is considered less likely. 3. Soft tissue signal changes posteriorly at the T2 level and extending above. This may reflect tumor or infection. 4. No significant extraosseous extension at the T9 or T10 levels. 5. Stable multilevel spondylosis in the cervical spine. 6. Heterogeneous marrow signal of the cervical spine appears be mostly discogenic. Additional sites of metastatic disease are not excluded. 7. The patient refused further imaging with contrast. Critical Value/emergent results were called by telephone at the time of interpretation on 07/08/2016 at 11:51 am to Dr. Rosalin Hawking , who verbally acknowledged these results. Electronically Signed   By: San Morelle M.D.   On: 07/08/2016 12:23   Mr Cervical Spine Wo Contrast  Result Date:  07/06/2016 CLINICAL DATA:  Acute lower extremity weakness, LEFT back pain and falls. History of prostate cancer, melanoma. EXAM: MRI HEAD WITHOUT CONTRAST MRI CERVICAL SPINE WITHOUT CONTRAST TECHNIQUE: Multiplanar, multiecho pulse sequences of the brain and surrounding structures, and cervical spine, to include the craniocervical junction and cervicothoracic junction, were obtained without intravenous contrast. Axial T2 sequence through cervical spine not obtained, patient was unable to tolerate further imaging. MRI thoracic spine performed at this time. COMPARISON:  CT HEAD July 05, 2016 FINDINGS: MRI HEAD FINDINGS- mildly motion degraded examination. BRAIN: Focal reduced diffusion LEFT temporal lobe/ hippocampus with low ADC values. No susceptibility artifact to suggest hemorrhage. The ventricles and sulci are normal for patient's age. Scattered subcentimeter supratentorial white matter FLAIR T2 hyperintensities are less than expected for age compatible with mild chronic small vessel ischemic disease. Old bilateral basal ganglia lacunar infarcts. No suspicious parenchymal signal, masses or mass effect. No abnormal extra-axial fluid collections. VASCULAR: Normal major intracranial vascular flow voids present at skull base. SKULL AND UPPER CERVICAL SPINE: No abnormal sellar expansion. No suspicious calvarial bone marrow signal. Craniocervical junction maintained. SINUSES/ORBITS: The mastoid air-cells and included paranasal sinuses are well-aerated. The included ocular globes and orbital contents are non-suspicious. Status post bilateral ocular lens implants and scleral banding. OTHER: None. MRI CERVICAL SPINE FINDINGS- moderate to severely motion degraded sagittal STIR and axial T2 merge ALIGNMENT: Straightened cervical lordosis.  No malalignment. VERTEBRAE/DISCS: Vertebral bodies are intact. Moderate C4-5 through C6-7 disc height loss with proportional subacute on chronic discogenic endplate changes. Disc  desiccation all cervical levels. No suspicious or acute bone marrow signal. CORD:Mildly expansile T2 bright signal at T2, incompletely evaluated. No abnormal cervical spine cord signal or syrinx. POSTERIOR FOSSA, VERTEBRAL ARTERIES, PARASPINAL TISSUES: Mild bright interstitial STIR signal upper thoracic paraspinal soft tissues. Limited overall assessment. DISC LEVELS: Limited assessment due to motion. Moderate to severe canal stenosis C4-5 due to broad-based disc bulge, moderate at C3-4. Nondiagnostic evaluation of the neural foramen. IMPRESSION: MRI HEAD: Mildly motion degraded examination. Acute small LEFT temporal lobe/ hippocampal infarct, less likely infectious etiology. Old basal ganglia lacunar infarcts and mild chronic small vessel ischemic disease. MRI CERVICAL SPINE: Abnormal  thoracic spinal cord signal partially imaged at T2. Mild suspected paraspinal muscle strain thoracic spine. Recommend MRI thoracic spine with and without contrast when patient is able to tolerate further imaging. Limited four sequence MRI, motion degraded. Moderate to severe canal stenosis C4-5 suspected, moderate at C3-4. Nondiagnostic assessment of the neural foramen. Electronically Signed   By: Elon Alas M.D.   On: 07/06/2016 19:33   Mr Thoracic Spine Wo Contrast  Result Date: 07/08/2016 CLINICAL DATA:  Episode of weakness and numbness in the lower extremities bilaterally 07/03/2016. Acute/subacute left temporal lobe infarct. Abnormal signal within the upper thoracic spinal cord on the cervical spine MRI. The examination had to be discontinued prior to completion due to patient refusal for further imaging. EXAM: MRI CERVICAL AND THORACIC SPINE WITHOUT CONTRAST TECHNIQUE: Multiplanar and multiecho pulse sequences of the cervical spine, to include the craniocervical junction and cervicothoracic junction, and thoracic spine, were obtained without intravenous contrast. COMPARISON:  MRI of cervical spine 07/06/2016.  FINDINGS: MRI CERVICAL SPINE FINDINGS Alignment: AP alignment is anatomic. There straightening of the normal cervical lordosis. Vertebrae: Chronic endplate marrow changes are again noted at C4-5 and on the right at C6-7. Vertebral body heights are maintained. Cord: Cervical spinal cord signal is normal. Posterior Fossa, vertebral arteries, paraspinal tissues: The craniocervical junction is normal. The visualized intracranial contents are normal. Flow is present in the vertebral arteries bilaterally. Disc levels: Disc disease is not significantly changed. C2-3: Moderate right foraminal narrowing is due to uncovertebral and facet spurring. C3-4: A broad-based disc osteophyte complex and facet hypertrophy contributes to moderate left greater than right foraminal narrowing and moderate right central canal stenosis. C4-5: Moderate to severe central canal stenosis is present. There is distortion of the spinal cord. The canal is narrowed to 7 mm. Severe foraminal stenosis is worse on the left. C5-6: A rightward disc osteophyte complex is present. There is partial effacement of the ventral CSF. Moderate right and mild left foraminal narrowing is present. C6-7: Uncovertebral spurring bilaterally is stable. Mild foraminal narrowing is worse on the left. C7-T1: Moderate facet hypertrophy is present bilaterally without significant stenosis. MRI THORACIC SPINE FINDINGS Alignment:  AP alignment is anatomic. Vertebrae: There is diffuse heterogeneous marrow signal. Abnormal signal is present diffusely within the T3 vertebral body involving the posterior elements bilaterally. There is abnormal signal on the right at T4 extending into the posterior elements. Epidural soft tissue extends cephalo caudad 4.3 cm from the mid body aches T2-3 the midbody of T4. This results in significant central canal stenosis. There is abnormal signal within the spinal cord at this level. Extensive T2 signal changes are present within the soft tissues  posterior to the thoracic spine at this level as well. The lesion at T9 measures 3.1 x 1.9 cm on the right. A smaller superior right-sided lesion measures 9 mm. A 10.5 mm lesion is evident posteriorly and inferiorly on the left at T11. No other definite involvement of posterior elements is noted. There is also abnormal signal in the right pedicle of T4. Cord: Abnormal cord signal is present from the midbody of T2 through T4 associated with cord compression by extraosseous soft tissue mass. Paraspinal and other soft tissues: Abnormal signal is present within the posterior paraspinal soft tissues T2-4. Disc levels: No significant focal disc protrusion is present. Facet hypertrophy contributes to mild right foraminal narrowing at T2-3. The foramina are otherwise patent. No other significant central canal stenosis is present. IMPRESSION: 1. Multiple osseous lesions within the thoracic spine, most notably  at T3, T4, T9, and T11, suggesting metastatic disease. Multiple myeloma is also considered. 2. Extraosseous soft tissue mass likely reflecting tumor extension at the T3 level results in severe central canal stenosis with mass effect on the spinal cord. Abnormal cord signal a a mix extends from the midbody of T2 through the midbody of T4 . Central cord tumor is considered less likely. 3. Soft tissue signal changes posteriorly at the T2 level and extending above. This may reflect tumor or infection. 4. No significant extraosseous extension at the T9 or T10 levels. 5. Stable multilevel spondylosis in the cervical spine. 6. Heterogeneous marrow signal of the cervical spine appears be mostly discogenic. Additional sites of metastatic disease are not excluded. 7. The patient refused further imaging with contrast. Critical Value/emergent results were called by telephone at the time of interpretation on 07/08/2016 at 11:51 am to Dr. Rosalin Hawking , who verbally acknowledged these results. Electronically Signed   By: San Morelle M.D.   On: 07/08/2016 12:23        Scheduled Meds: . apixaban  2.5 mg Oral BID  . atorvastatin  40 mg Oral q1800  . cyanocobalamin  1,000 mcg Intramuscular Daily  . dexamethasone  10 mg Intravenous Q6H  . dorzolamide-timolol  1 drop Right Eye BID  . fentaNYL (SUBLIMAZE) injection  50 mcg Intravenous Once  . iopamidol      . latanoprost  1 drop Right Eye QHS  . mouth rinse  15 mL Mouth Rinse q12n4p  . Vitamin D (Ergocalciferol)  50,000 Units Oral Q7 days   Continuous Infusions: . sodium chloride 75 mL/hr at 07/08/16 0148  . sodium chloride irrigation       LOS: 5 days    Time spent: 45 minutes.     Hosie Poisson, MD [facility] Pager [phone]   If 7PM-7AM, please contact night-coverage [email or web address] Password [facility] 07/08/2016, 3:41 PM

## 2016-07-08 NOTE — Progress Notes (Signed)
Vascular ultrasound preliminary results.   Transcranial Doppler with bubble study completed.  [name] performed. Left hand IV used. Right MCA insonated.  HITS heard at rest: >30, <100 Patient unable to cooperate for Valsalva.  Small PFO. Spencer degree III    Landry Mellow, RDMS, RVT 07/08/2016

## 2016-07-08 NOTE — Progress Notes (Signed)
Pt returned from MRI, pt given first bottle of CT contrast. Daughter at bedside. Will continue to monitor.

## 2016-07-08 NOTE — Progress Notes (Signed)
Per insurance check for Eliquis # 3.  [facility] [employer] RX # [phone]   1.ELIQUIS 2.5 MG BID    COVER- YES  CO-PAY-$ 45.00 Q/L 2 PILLS PER DAY  TIER- 3 DRUG  PRIOR APPROVAL- NO   3. ELIQUIS 5 MG BID  SAME AS ABOVE    PREFERRED PHARMACY : [facility]

## 2016-07-09 ENCOUNTER — Encounter (HOSPITAL_COMMUNITY): Payer: Self-pay | Admitting: Physical Medicine and Rehabilitation

## 2016-07-09 DIAGNOSIS — Z859 Personal history of malignant neoplasm, unspecified: Secondary | ICD-10-CM

## 2016-07-09 DIAGNOSIS — G9520 Unspecified cord compression: Secondary | ICD-10-CM

## 2016-07-09 DIAGNOSIS — G8220 Paraplegia, unspecified: Secondary | ICD-10-CM

## 2016-07-09 DIAGNOSIS — L89301 Pressure ulcer of unspecified buttock, stage 1: Secondary | ICD-10-CM

## 2016-07-09 LAB — BASIC METABOLIC PANEL
ANION GAP: 6 (ref 5–15)
BUN: 50 mg/dL — ABNORMAL HIGH (ref 6–20)
CALCIUM: 8.2 mg/dL — AB (ref 8.9–10.3)
CO2: 20 mmol/L — ABNORMAL LOW (ref 22–32)
Chloride: 111 mmol/L (ref 101–111)
Creatinine, Ser: 1.36 mg/dL — ABNORMAL HIGH (ref 0.61–1.24)
GFR, EST AFRICAN AMERICAN: 51 mL/min — AB (ref >60)
GFR, EST NON AFRICAN AMERICAN: 44 mL/min — AB (ref >60)
Glucose, Bld: 121 mg/dL — ABNORMAL HIGH (ref 65–99)
Potassium: 4 mmol/L (ref 3.5–5.1)
Sodium: 137 mmol/L (ref 135–145)

## 2016-07-09 LAB — CBC
HCT: 25.1 % — ABNORMAL LOW (ref 39.0–52.0)
Hemoglobin: 8.2 g/dL — ABNORMAL LOW (ref 13.0–17.0)
MCH: 29.5 pg (ref 26.0–34.0)
MCHC: 32.7 g/dL (ref 30.0–36.0)
MCV: 90.3 fL (ref 78.0–100.0)
Platelets: 249 10*3/uL (ref 150–400)
RBC: 2.78 MIL/uL — ABNORMAL LOW (ref 4.22–5.81)
RDW: 14.3 % (ref 11.5–15.5)
WBC: 13.6 10*3/uL — ABNORMAL HIGH (ref 4.0–10.5)

## 2016-07-09 LAB — ABO/RH: ABO/RH(D): A POS

## 2016-07-09 LAB — PREPARE RBC (CROSSMATCH)

## 2016-07-09 MED ORDER — NITROGLYCERIN 0.4 MG SL SUBL
SUBLINGUAL_TABLET | SUBLINGUAL | Status: AC
  Filled 2016-07-09: qty 1

## 2016-07-09 MED ORDER — DEXAMETHASONE SODIUM PHOSPHATE 10 MG/ML IJ SOLN
4.0000 mg | Freq: Four times a day (QID) | INTRAMUSCULAR | Status: DC
  Administered 2016-07-09 – 2016-07-10 (×4): 4 mg via INTRAVENOUS
  Filled 2016-07-09 (×4): qty 1

## 2016-07-09 MED ORDER — SODIUM CHLORIDE 0.9 % IV SOLN
Freq: Once | INTRAVENOUS | Status: AC
  Administered 2016-07-09: 08:00:00 via INTRAVENOUS

## 2016-07-09 MED ORDER — CALCIUM CARBONATE ANTACID 500 MG PO CHEW: 1.0000 | CHEWABLE_TABLET | Freq: Once | ORAL | Status: DC

## 2016-07-09 NOTE — Progress Notes (Signed)
Patient ID: Luis Pierce, male   DOB: 06/15/1922, 83 y.o.   MRN: [id number] Subjective:  The patient is alert and pleasant. His daughter is at the bedside. He has no complaints.  Objective: Vital signs in last 24 hours: Temp:  [98.4 F (36.9 C)-98.6 F (37 C)] 98.6 F (37 C) (06/22 0416) Pulse Rate:  [79-99] 99 (06/22 0416) Resp:  [17-18] 18 (06/22 0416) BP: (110-123)/(47-56) 123/53 (06/22 0416) SpO2:  [90 %-98 %] 96 % (06/22 0416)  Intake/Output from previous day: 06/21 0701 - 06/22 0700 In: 420 [P.O.:120] Out: 751 [Urine:750; Stool:1] Intake/Output this shift: No intake/output data recorded.  Physical exam the patient is alert and pleasant. He is paraplegic.  Lab Results:  Recent Labs  07/08/16 0334 07/08/16 2036 07/09/16 0228  WBC 12.7*  --  13.6*  HGB 8.2* 7.7* 8.2*  HCT 25.4* 23.3* 25.1*  PLT 224  --  249   BMET  Recent Labs  07/08/16 0334 07/09/16 0228  NA 138 137  K 3.9 4.0  CL 112* 111  CO2 21* 20*  GLUCOSE 104* 121*  BUN 59* 50*  CREATININE 1.17 1.36*  CALCIUM 8.2* 8.2*    Studies/Results: Mr Cervical Spine Wo Contrast  Result Date: 07/08/2016 CLINICAL DATA:  Episode of weakness and numbness in the lower extremities bilaterally 07/03/2016. Acute/subacute left temporal lobe infarct. Abnormal signal within the upper thoracic spinal cord on the cervical spine MRI. The examination had to be discontinued prior to completion due to patient refusal for further imaging. EXAM: MRI CERVICAL AND THORACIC SPINE WITHOUT CONTRAST TECHNIQUE: Multiplanar and multiecho pulse sequences of the cervical spine, to include the craniocervical junction and cervicothoracic junction, and thoracic spine, were obtained without intravenous contrast. COMPARISON:  MRI of cervical spine 07/06/2016. FINDINGS: MRI CERVICAL SPINE FINDINGS Alignment: AP alignment is anatomic. There straightening of the normal cervical lordosis. Vertebrae: Chronic endplate marrow changes are again noted at  C4-5 and on the right at C6-7. Vertebral body heights are maintained. Cord: Cervical spinal cord signal is normal. Posterior Fossa, vertebral arteries, paraspinal tissues: The craniocervical junction is normal. The visualized intracranial contents are normal. Flow is present in the vertebral arteries bilaterally. Disc levels: Disc disease is not significantly changed. C2-3: Moderate right foraminal narrowing is due to uncovertebral and facet spurring. C3-4: A broad-based disc osteophyte complex and facet hypertrophy contributes to moderate left greater than right foraminal narrowing and moderate right central canal stenosis. C4-5: Moderate to severe central canal stenosis is present. There is distortion of the spinal cord. The canal is narrowed to 7 mm. Severe foraminal stenosis is worse on the left. C5-6: A rightward disc osteophyte complex is present. There is partial effacement of the ventral CSF. Moderate right and mild left foraminal narrowing is present. C6-7: Uncovertebral spurring bilaterally is stable. Mild foraminal narrowing is worse on the left. C7-T1: Moderate facet hypertrophy is present bilaterally without significant stenosis. MRI THORACIC SPINE FINDINGS Alignment:  AP alignment is anatomic. Vertebrae: There is diffuse heterogeneous marrow signal. Abnormal signal is present diffusely within the T3 vertebral body involving the posterior elements bilaterally. There is abnormal signal on the right at T4 extending into the posterior elements. Epidural soft tissue extends cephalo caudad 4.3 cm from the mid body aches T2-3 the midbody of T4. This results in significant central canal stenosis. There is abnormal signal within the spinal cord at this level. Extensive T2 signal changes are present within the soft tissues posterior to the thoracic spine at this level as well. The  lesion at T9 measures 3.1 x 1.9 cm on the right. A smaller superior right-sided lesion measures 9 mm. A 10.5 mm lesion is evident  posteriorly and inferiorly on the left at T11. No other definite involvement of posterior elements is noted. There is also abnormal signal in the right pedicle of T4. Cord: Abnormal cord signal is present from the midbody of T2 through T4 associated with cord compression by extraosseous soft tissue mass. Paraspinal and other soft tissues: Abnormal signal is present within the posterior paraspinal soft tissues T2-4. Disc levels: No significant focal disc protrusion is present. Facet hypertrophy contributes to mild right foraminal narrowing at T2-3. The foramina are otherwise patent. No other significant central canal stenosis is present. IMPRESSION: 1. Multiple osseous lesions within the thoracic spine, most notably at T3, T4, T9, and T11, suggesting metastatic disease. Multiple myeloma is also considered. 2. Extraosseous soft tissue mass likely reflecting tumor extension at the T3 level results in severe central canal stenosis with mass effect on the spinal cord. Abnormal cord signal a a mix extends from the midbody of T2 through the midbody of T4 . Central cord tumor is considered less likely. 3. Soft tissue signal changes posteriorly at the T2 level and extending above. This may reflect tumor or infection. 4. No significant extraosseous extension at the T9 or T10 levels. 5. Stable multilevel spondylosis in the cervical spine. 6. Heterogeneous marrow signal of the cervical spine appears be mostly discogenic. Additional sites of metastatic disease are not excluded. 7. The patient refused further imaging with contrast. Critical Value/emergent results were called by telephone at the time of interpretation on 07/08/2016 at 11:51 am to Dr. Marvel Plan , who verbally acknowledged these results. Electronically Signed   By: Marin Roberts M.D.   On: 07/08/2016 12:23   Mr Thoracic Spine Wo Contrast  Result Date: 07/08/2016 CLINICAL DATA:  Episode of weakness and numbness in the lower extremities bilaterally  07/03/2016. Acute/subacute left temporal lobe infarct. Abnormal signal within the upper thoracic spinal cord on the cervical spine MRI. The examination had to be discontinued prior to completion due to patient refusal for further imaging. EXAM: MRI CERVICAL AND THORACIC SPINE WITHOUT CONTRAST TECHNIQUE: Multiplanar and multiecho pulse sequences of the cervical spine, to include the craniocervical junction and cervicothoracic junction, and thoracic spine, were obtained without intravenous contrast. COMPARISON:  MRI of cervical spine 07/06/2016. FINDINGS: MRI CERVICAL SPINE FINDINGS Alignment: AP alignment is anatomic. There straightening of the normal cervical lordosis. Vertebrae: Chronic endplate marrow changes are again noted at C4-5 and on the right at C6-7. Vertebral body heights are maintained. Cord: Cervical spinal cord signal is normal. Posterior Fossa, vertebral arteries, paraspinal tissues: The craniocervical junction is normal. The visualized intracranial contents are normal. Flow is present in the vertebral arteries bilaterally. Disc levels: Disc disease is not significantly changed. C2-3: Moderate right foraminal narrowing is due to uncovertebral and facet spurring. C3-4: A broad-based disc osteophyte complex and facet hypertrophy contributes to moderate left greater than right foraminal narrowing and moderate right central canal stenosis. C4-5: Moderate to severe central canal stenosis is present. There is distortion of the spinal cord. The canal is narrowed to 7 mm. Severe foraminal stenosis is worse on the left. C5-6: A rightward disc osteophyte complex is present. There is partial effacement of the ventral CSF. Moderate right and mild left foraminal narrowing is present. C6-7: Uncovertebral spurring bilaterally is stable. Mild foraminal narrowing is worse on the left. C7-T1: Moderate facet hypertrophy is present bilaterally without  significant stenosis. MRI THORACIC SPINE FINDINGS Alignment:  AP  alignment is anatomic. Vertebrae: There is diffuse heterogeneous marrow signal. Abnormal signal is present diffusely within the T3 vertebral body involving the posterior elements bilaterally. There is abnormal signal on the right at T4 extending into the posterior elements. Epidural soft tissue extends cephalo caudad 4.3 cm from the mid body aches T2-3 the midbody of T4. This results in significant central canal stenosis. There is abnormal signal within the spinal cord at this level. Extensive T2 signal changes are present within the soft tissues posterior to the thoracic spine at this level as well. The lesion at T9 measures 3.1 x 1.9 cm on the right. A smaller superior right-sided lesion measures 9 mm. A 10.5 mm lesion is evident posteriorly and inferiorly on the left at T11. No other definite involvement of posterior elements is noted. There is also abnormal signal in the right pedicle of T4. Cord: Abnormal cord signal is present from the midbody of T2 through T4 associated with cord compression by extraosseous soft tissue mass. Paraspinal and other soft tissues: Abnormal signal is present within the posterior paraspinal soft tissues T2-4. Disc levels: No significant focal disc protrusion is present. Facet hypertrophy contributes to mild right foraminal narrowing at T2-3. The foramina are otherwise patent. No other significant central canal stenosis is present. IMPRESSION: 1. Multiple osseous lesions within the thoracic spine, most notably at T3, T4, T9, and T11, suggesting metastatic disease. Multiple myeloma is also considered. 2. Extraosseous soft tissue mass likely reflecting tumor extension at the T3 level results in severe central canal stenosis with mass effect on the spinal cord. Abnormal cord signal a a mix extends from the midbody of T2 through the midbody of T4 . Central cord tumor is considered less likely. 3. Soft tissue signal changes posteriorly at the T2 level and extending above. This may reflect  tumor or infection. 4. No significant extraosseous extension at the T9 or T10 levels. 5. Stable multilevel spondylosis in the cervical spine. 6. Heterogeneous marrow signal of the cervical spine appears be mostly discogenic. Additional sites of metastatic disease are not excluded. 7. The patient refused further imaging with contrast. Critical Value/emergent results were called by telephone at the time of interpretation on 07/08/2016 at 11:51 am to Dr. Rosalin Hawking , who verbally acknowledged these results. Electronically Signed   By: San Morelle M.D.   On: 07/08/2016 12:23   Ct Abdomen Pelvis W Contrast  Result Date: 07/08/2016 CLINICAL DATA:  Hematuria.  History of prostate cancer. EXAM: CT ABDOMEN AND PELVIS WITH CONTRAST TECHNIQUE: Multidetector CT imaging of the abdomen and pelvis was performed using the standard protocol following bolus administration of intravenous contrast. CONTRAST:  69m ISOVUE-300 IOPAMIDOL (ISOVUE-300) INJECTION 61% COMPARISON:  CT scan of January 26, 2016. FINDINGS: Lower chest: Mild bibasilar posterior subsegmental atelectasis is noted. Hepatobiliary: No focal liver abnormality is seen. Status post cholecystectomy. No biliary dilatation. Pancreas: Unremarkable. No pancreatic ductal dilatation or surrounding inflammatory changes. Spleen: Normal in size without focal abnormality. Adrenals/Urinary Tract: Adrenal glands are unremarkable. Bilateral parapelvic cysts are noted. Nonobstructive 1 cm calculus is seen in middle pole infundibulum. Mild bilateral hydroureteronephrosis is noted without obstructing calculus. Urinary bladder is decompressed secondary to Foley catheter. Several bladder calculi are noted. 4.5 x 2.9 cm irregular lobulated mass appears to be involving the posterior wall of the urinary bladder which is slightly enlarged compared to prior exam. This is most consistent with invasive and recurrent prostate cancer. Cystoscopy is recommended for further  evaluation.  Stomach/Bowel: Stomach is unremarkable. Status post right hemicolectomy, with small bowel dilatation extending to the surgical anastomosis. This is concerning for at least partial small bowel obstruction. Vascular/Lymphatic: Aortic atherosclerosis. No enlarged abdominal or pelvic lymph nodes. Reproductive: As noted above, 4.5 x 2.9 cm lobulated mass is seen in posterior inferior portion of urinary bladder concerning for invasive and recurrent prostate cancer. Other: Penile reservoir is noted in left lower quadrant. No hernia or abnormal fluid collection is noted. Musculoskeletal: 2 sclerotic lesions are noted in the left iliac wing which are increased compared to prior exam and consistent with metastatic disease. Stable sclerotic density seen in right iliac wing concerning for metastatic disease. New metastatic focus is noted in inferior portion of left ischial tuberosity. IMPRESSION: Mild bibasilar subsegmental atelectasis. Nonobstructive 1 cm right renal calculus. Mild bilateral hydroureteronephrosis is noted without obstructing calculus. Urinary bladder is decompressed secondary to Foley catheter. Several bladder calculi are noted. 4.5 x 2.9 cm irregular lobulated mass appears to be involving the posterior and inferior wall of urinary bladder which is slightly enlarged compared to prior exam. This is concerning for invasive recurrent prostate cancer, and cystoscopy is recommended for further evaluation. Status post right hemicolectomy. Moderate small bowel dilatation is noted which extends to the surgical anastomosis in the right side of the abdomen, concerning for partial small bowel obstruction. Aortic atherosclerosis. Osseous metastatic lesions are noted in the left side of the pelvis which are increased compared to prior exam. Electronically Signed   By: Marijo Conception, M.D.   On: 07/08/2016 15:55    Assessment/Plan: Thoracic metastasis, paraplegia: I think his paraplegia is permanent and surgery would  be futile. Furthermore he is not a good surgical candidate. The plan is rehabilitation and eventually hospice care. I have spoken to his daughter, Dr. Tamala Julian. Please call if I can be of further assistance.  LOS: 6 days     Eilyn Polack D 07/09/2016, 7:49 AM

## 2016-07-09 NOTE — PMR Pre-admission (Signed)
PMR Admission Coordinator Pre-Admission Assessment  Patient: Luis Pierce is an 83 y.o., male MRN: [id number] DOB: [birth year] Height: 5\' 10"  (177.8 cm) Weight: 86 kg (189 lb 9.6 oz)              Insurance Information HMO:     PPO: yes     PCP:      IPA:      80/20:      OTHER: medicare advantage plan PRIMARY: [employer] Medicare      Policy#: [id number]      Subscriber: pt CM Name: Luis Pierce      Phone#: [phone]     Fax#: [phone] Pre-Cert#: [id number]      Employer: retired approved for 7 days. Vevelyn Pierce will follow phone [phone] fax [phone] Benefits:  Phone #: [phone]     Name:  Eff. Date: 01/19/16     Deduct: none      Out of Pocket Max: $4500      Life Max: none CIR: $345 co pay per day days 1-5      SNF: no co pay days 1-20: $160 co pay per day days 21-49: no co pay days 50-100 Outpatient: $40 co pay per visit      Co-Pay: visits per medical neccesity Home Health: 100%      Co-Pay: visits per medical neccesity DME: 80%     Co-Pay: 20% Providers: in metalwork  SECONDARY: none       Medicaid Application Date:       Case Manager:  Disability Application Date:       Case Worker:   Emergency Contact Information Contact Information    Name Relation Home Work Mobile   Luis Pierce Daughter [phone]   Theile,Catherine S Spouse [phone]       Current Medical History  Patient Admitting Diagnosis: paraparesis  History of Present Illness:   HPI: Luis Pierce is a 83 y.o. 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  His daughter/Dr. Carol Ada lives in the area. Presented 07/03/2016 with acute lower extremity weakness as well as questionable slurred speech. Troponin negative. CT of the head negative for acute changes. MRI of  the head showed old basal ganglia lacunar infarct mild chronic small vessel disease. 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 MRI cervical spine showed abnormal thoracic spinal cord signal partially imaged at T2. Moderate to severe canal stenosis C4-5 and moderate C3-4. CT of the chest showed right-sided pulmonary embolism without findings for right heart strain. Venous Dopplers lower extremities showed DVT right peroneal vein. MRI thoracic spine pending with poor patient cooperation. Echocardiogram with ejection fraction 41% grade 1 diastolic dysfunction. Currently maintained on intravenous heparin.AKI from baseline and 1.00 elevated at 2.61 with renal service is consulted. Renal ultrasound showed no hydronephrosis. Felt AKI likely due to relative hypotension maintained on IV fluids. Urology follow-up for gross hematuria monitored closely with history of artificial sphincter. Acute blood loss anemia 9.5 and monitored. Neurology continues to follow for lower extremity weakness suspect spinal cord infarction.  MRI spine showed multiple osseous lesion T3, T4, T9 and T11 with extraosseous tumor extenison at T3 level with severe central canal stenosis and mass effect on spinal cord, abnormal signal  from T2-T4 mid body, soft tissue signal from T2 to above suspected mild thoracic sprain and moderate to sever canal stenosis C3-4--limited study as patient refused contrast. MRI brain with acute small left temporal lobe/hippocampal infarct. Neurosurgery consulted and felt pt not a surgical candidate, recommend steroids to see if improves, to use back brace if he complains of pain, palliative radiation not indicated at this time.   Patient has stress urinary incontinent s/p urinary artificial sphincter placement. AUS was deactivated and foley placed in consultation with urology on 6/17. Persistent  hematuria as imaging showed multiple stones and recurrent prostate cancer. He was initially started on CBI but it is on hold now. Daughter requesting foley to be discontinued with condom catheter placement.   Felt invasive and recurrent prostate cancer. Family wishes no further treatment, rehab stay to assist with bowel and bladder program, DME acquisition, caregiver education and home with hospice/palliative service at d/c.   Total: 9 NIHSS    Past Medical History  Past Medical History:  Diagnosis Date  . Blind left eye    retinal detached retina  . 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 Chronic kidney disease    hx kidney stones and urinary incontinence  . Colon cancer ([place])   . Coronary artery disease   . Diverticulosis   . DJD (degenerative joint disease)   . Glaucoma   . Hyperlipemia   . Hypertension   . Lipoma of back   . Melanoma of back ([place])   . Mild mitral regurgitation   . Prostate cancer ([place])   . Retinal detachment   . Urinary incontinence   . Urinary incontinence, male, stress 07/04/2016    Family History  family history is not on file.  Prior Rehab/Hospitalizations:  Has the patient had major surgery during 100 days prior to admission? No  Current Medications   Current Facility-Administered Medications:  .  0.9 %  sodium chloride infusion, , Intravenous, Continuous, Luis Hawking, MD, Last Rate: 75 mL/hr at 07/09/16 0531 .  0.9 %  sodium chloride infusion, , Intravenous, Once, Luis Poisson, MD .  acetaminophen (TYLENOL) tablet 650 mg, 650 mg, Oral, Q6H PRN, 650 mg at 07/04/16 1944 **OR** acetaminophen (TYLENOL) suppository 650 mg, 650 mg, Rectal, Q6H PRN, Luis Pierce, Luis E, PA-C .  apixaban (ELIQUIS) tablet 2.5 mg, 2.5 mg, Oral, BID, Luis Pierce, RPH, 2.5 mg at 07/09/16 1019 .  atorvastatin (LIPITOR) tablet 40 mg, 40 mg, Oral, q1800, Luis Jumbo, PA-C, 40 mg at 07/08/16 1800 .  bisacodyl (DULCOLAX) suppository 10 mg, 10 mg, Rectal, Daily PRN, Luis Jumbo, PA-C .  calcium carbonate (TUMS - dosed in mg elemental calcium) chewable tablet 200 mg of elemental calcium, 1 tablet, Oral, Once, Luis Poisson, MD, Stopped at 07/09/16 0215 .  cyanocobalamin ((VITAMIN B-12)) injection 1,000 mcg, 1,000 mcg, Intramuscular, Daily, Eugenie Filler, MD, 1,000 mcg at 07/09/16 1020 .  dexamethasone (DECADRON) injection 10 mg, 10 mg, Intravenous, Q6H, Luis Hawking, MD, 10 mg at 07/09/16 1305 .  dorzolamide-timolol (COSOPT) 22.3-6.8 MG/ML ophthalmic solution 1 drop, 1 drop, Right Eye, BID, Luis Jumbo, PA-C, 1 drop at 07/09/16 1020 .  fentaNYL (SUBLIMAZE) injection 50 mcg, 50 mcg, Intravenous, Once, Julianne Rice, MD .  HYDROcodone-acetaminophen (NORCO/VICODIN) 5-325 MG per tablet 1-2 tablet, 1-2 tablet, Oral, Q4H PRN, Luis Pierce, Luis E, PA-C .  latanoprost (XALATAN) 0.005 % ophthalmic solution 1 drop, 1 drop, Right Eye, QHS, Luis Jumbo, PA-C, 1 drop at 07/08/16 2147 .  LORazepam (ATIVAN)  injection 0.5 mg, 0.5 mg, Intravenous, QHS PRN, Eugenie Filler, MD .  MEDLINE mouth rinse, 15 mL, Mouth Rinse, q12n4p, Buriev, Arie Sabina, MD, 15 mL at 07/08/16 1600 .  ondansetron (ZOFRAN) tablet 4 mg, 4 mg, Oral, Q6H PRN **OR** ondansetron (ZOFRAN) injection 4 mg, 4 mg, Intravenous, Q6H PRN, Luis Pierce, Luis E, PA-C .  senna-docusate (Senokot-S) tablet 1 tablet, 1 tablet, Oral, QHS PRN, Luis Jumbo, PA-C, 1 tablet at 07/06/16 2115 .  sodium chloride irrigation 0.9 % 3,000 mL, 3,000 mL, Irrigation, Continuous, Eugenie Filler, MD, 3,000 mL at 07/06/16 0346 .  Vitamin D (Ergocalciferol) (DRISDOL) capsule 50,000 Units, 50,000 Units, Oral, Q7 days, Luis Poisson, MD, 50,000 Units at 07/08/16 1200  Patients Current Diet: Diet regular Room service appropriate? Yes; Fluid consistency: Thin  Precautions / Restrictions Precautions Precautions: Fall Precaution Comments: watch BP; BLE wraps Restrictions Weight Bearing Restrictions: No   Has the patient had 2 or more  falls or a fall with injury in the past year?No  Prior Activity Level Limited Community (1-2x/wk): Independent without AD; not driving due to [place] / Equipment Home Assistive Devices/Equipment: None  Prior Device Use: Indicate devices/aids used by the patient prior to current illness, exacerbation or injury? Walker Or cane in the community. No AD in the home  Prior Functional Level Prior Function Level of Independence: Independent Comments: does not drive, wife does meal prep, has housekeeper, pt does not walk with device inside home  Self Care: Did the patient need help bathing, dressing, using the toilet or eating?  Independent  Indoor Mobility: Did the patient need assistance with walking from room to room (with or without device)? Independent  Stairs: Did the patient need assistance with internal or external stairs (with or without device)? Needed some help  Functional Cognition: Did the patient need help planning regular tasks such as shopping or remembering to take medications? Needed some help  Current Functional Level Cognition  Overall Cognitive Status: Impaired/Different from baseline Current Attention Level: Sustained Orientation Level: Oriented to person, Oriented to place Following Commands: Follows one step commands consistently (with multimodal cues due to blindness) General Comments: Pleasant.  Wants to get OOB. Having visual hallucinations, daughter reports this is not unusual.    Extremity Assessment (includes Sensation/Coordination)  Upper Extremity Assessment: Generalized weakness  Lower Extremity Assessment: RLE deficits/detail, LLE deficits/detail RLE Deficits / Details: no active movement, PROM is WFL, flaccid B, impaired sensation RLE Sensation: decreased light touch, decreased proprioception LLE Deficits / Details: no active movement, PROM is WFL LLE Sensation: decreased light touch, decreased proprioception    ADLs  Overall  ADL's : Pierce assistance/impaired Grooming: Wash/dry face, Sitting, Set up, Oral care, Minimal assistance General ADL Comments: Pt can self feed with set up and participate in grooming, dependent in bathing, dressing, toileting.    Mobility  Overal bed mobility: Pierce Assistance Bed Mobility: Rolling Rolling: Max assist Sidelying to sit: Max assist, +2 for physical assistance Sit to sidelying: +2 for safety/equipment, Max assist General bed mobility comments: Patient able to use UE's on rails to initiate roll and to roll upper body.  Assist required for lower body.    Transfers  Overall transfer level: Pierce assistance Transfer via Lift Equipment: [place] transfer comment: Total A to transfer to chair using maximove.   LE's wrapped with Ace bandages to assist with BP.    Ambulation / Gait / Stairs / Wheelchair Mobility  Ambulation/Gait General Gait Details: N/A  Posture / Balance Dynamic Sitting Balance Sitting balance - Comments: In chair, patient able to use UE's on armrests to pull trunk forward.  Able to maintain sitting balance with UE support and min guard assist.  Worked on pulling self forward and controlling return to seat back.  Repeated x10.  Worked on lateral weight shifting for pressure relief to both sides x2.  Patient able to return self to midline position after leaning to each side. Balance Overall balance assessment: Pierce assistance Sitting-balance support: Bilateral upper extremity supported, Feet supported Sitting balance-Leahy Scale: Poor Sitting balance - Comments: In chair, patient able to use UE's on armrests to pull trunk forward.  Able to maintain sitting balance with UE support and min guard assist.  Worked on pulling self forward and controlling return to seat back.  Repeated x10.  Worked on lateral weight shifting for pressure relief to both sides x2.  Patient able to return self to midline position after leaning to each side. Postural control:  Posterior lean, Right lateral lean, Left lateral lean    Special Pierce/care consideration BiPAP/CPAP  N/a CPM  N/a Continuous Drip IV  N/a Dialysis  N/a Life Vest  N/a Oxygen  N/a Special Bed  N/a Trach Size  N/a Wound Vac n/a Skin rash right foot; skin tear buttocks Bowel mgmt: LBM 6/21 incontinent Bladder mgmt: artificial urinary sphincter deactivated by urologist; current indwelling catheter 6/22 but daughter requesting condom catheter Diabetic mgmt n/a Near blindness   Previous Home Environment Living Arrangements: Spouse/significant other  Lives With: Spouse Available Help at Discharge: Family, Available 24 hours/day (family and hired caregivers) Type of Home: House Home Layout: Two level, Laundry or work area in basement Alternate Therapist, sports of Steps: flight Home Access: Stairs to enter Technical brewer of Steps: 8-10 from front of house (will need to get ramp) Bathroom Shower/Tub: Tub/shower unit, Architectural technologist: Programmer, systems: No Home Care Services: No Additional Comments: level entry through the basement' 8- 10 steos though front door  Discharge Living Setting Plans for Discharge Living Setting: Patient's home, Lives with (comment) (wife) Type of Home at Discharge: House Discharge Home Layout: Laundry or work area in basement, Two level (stair lift to basement) Alternate Level Stairs-Number of Steps: flight. have stair lift from basement Discharge Home Access: Stairs to enter [employer] of Steps: 8-10 (wil come in throught basement entry) Discharge Bathroom Shower/Tub: Tub/shower unit, Curtain Discharge Bathroom Toilet: Standard Discharge Bathroom Accessibility: No Does the patient have any problems obtaining your medications?: No  Social/Family/Support Systems Patient Roles: Spouse, Parent Contact Information: Dr. Carol Ada, daughter Anticipated Caregiver: hired caregivers and family Anticipated  Caregiver's Contact Information: see above Ability/Limitations of Caregiver: daughter, Hal Hope works, daughter from [place] intermittent Caregiver Availability: 24/7 Discharge Plan Discussed with Primary Caregiver: Yes Is Caregiver In Agreement with Plan?: Yes Does Caregiver/Family have Issues with Lodging/Transportation while Pt is in Rehab?: No   Dr. Jeremy Johann, daughter primary contact. She will hire caregivers. She is requesting hospice/palliative consult prior to d/c home to see what services he is eligible for at home. She is requesting coordination of d/c home and is realistic about expectations for rehab and d/c home. She is overwhelmed and Pierce assistance.  Goals/Additional Pierce Patient/Family Goal for Rehab: Mod PT, OT, and SLP at wheelchair level Expected length of stay: [facility] 2-3 weeks Pt/Family Agrees to Admission and willing to participate: Yes Program Orientation Provided & Reviewed with Pt/Caregiver Including Roles  & Responsibilities: Yes   Decrease burden of Care  through IP rehab admission: Specialzed equipment Pierce, Decrease number of caregivers, Bowel and bladder program and Patient/family education  Possible need for SNF placement upon discharge: not anticipated; patient is clear with family that he wants to get home and die in his home with hospice services  Patient Condition: This patient's medical and functional status has changed since the consult dated: 07/07/2016 in which the Rehabilitation Physician determined and documented that the patient's condition is appropriate for intensive rehabilitative care in an inpatient rehabilitation facility. See "History of Present Illness" (above) for medical update. Functional changes are: max to total assist. Patient's medical and functional status update has been discussed with the Rehabilitation physician and patient remains appropriate for inpatient rehabilitation. Will admit to inpatient rehab today.  Preadmission Screen  Completed By:  Cleatrice Burke, 07/09/2016 3:50 PM ______________________________________________________________________   Discussed status with Dr. Letta Pate on 07/08/2016 at 0800 and received telephone approval for admission today.  Admission Coordinator:  Cleatrice Burke, time 0800 Date 07/15/2016

## 2016-07-09 NOTE — Care Management Note (Signed)
Case Management Note  Patient Details  Name: Luis Pierce MRN: [id number] Date of Birth: [birth year]  Subjective/Objective:                 Patient's daughter provided with 30 day Eliquis card. Verbalized understanding for use (she is a doctor). She stated she talked with Pamala Hurry from CIR this AM, and she is coming back with forms for admission tomorrow.    Action/Plan:  Will DC to CIR Saturday.   Expected Discharge Date:                  Expected Discharge Plan:  [place]  In-House Referral:  Clinical Social Work  Discharge planning Services  CM Consult, Medication Assistance  Post Acute Care Choice:    Choice offered to:     DME Arranged:    DME Agency:     HH Arranged:    [place] Agency:     Status of Service:  Completed, signed off  If discussed at [employer], dates discussed:    Additional Comments:  Carles Collet, RN 07/09/2016, 10:22 AM

## 2016-07-09 NOTE — Progress Notes (Signed)
I met with pt's daughter, Dr. Tamala Julian, at bedside to discuss overall plans. She is in agreement to [facility] on Saturday as discussed with Dr. Letta Pate. I discussed with Dr. Karleen Hampshire and will make the arrangements to admit tomorrow. [phone]

## 2016-07-09 NOTE — Progress Notes (Signed)
STROKE TEAM PROGRESS NOTE   SUBJECTIVE (INTERVAL HISTORY) His daughter is at the bedside.  The patient is more awake alert with mild confusion, likely due to steroids and sleep wake cycle disturbance. CT abdomen and pelvis suggesting pelvic metastasis with recurrent prostate cancer vs. Bladder cancer.    OBJECTIVE Temp:  [98.4 F (36.9 C)-98.6 F (37 C)] 98.6 F (37 C) (06/22 0416) Pulse Rate:  [79-99] 99 (06/22 0416) Cardiac Rhythm: Normal sinus rhythm (06/22 0700) Resp:  [17-18] 18 (06/22 0416) BP: (110-123)/(47-56) 123/53 (06/22 0416) SpO2:  [90 %-98 %] 96 % (06/22 0416)  CBC:  Recent Labs Lab 07/03/16 0819  07/04/16 0958  07/08/16 0334 07/08/16 2036 07/09/16 0228  WBC 12.6*  --  14.1*  < > 12.7*  --  13.6*  NEUTROABS 11.0*  --  11.7*  --   --   --   --   HGB 14.4  --  13.8  < > 8.2* 7.7* 8.2*  HCT 42.8  < > 40.2  < > 25.4* 23.3* 25.1*  MCV 88.6  --  88.9  < > 91.7  --  90.3  PLT 130*  --  123*  < > 224  --  249  < > = values in this interval not displayed.  Basic Metabolic Panel:   Recent Labs Lab 07/08/16 0334 07/09/16 0228  NA 138 137  K 3.9 4.0  CL 112* 111  CO2 21* 20*  GLUCOSE 104* 121*  BUN 59* 50*  CREATININE 1.17 1.36*  CALCIUM 8.2* 8.2*    Lipid Panel:     Component Value Date/Time   CHOL 102 07/08/2016 0334   TRIG 128 07/08/2016 0334   HDL 24 (L) 07/08/2016 0334   CHOLHDL 4.3 07/08/2016 0334   VLDL 26 07/08/2016 0334   LDLCALC 52 07/08/2016 0334   HgbA1c: No results found for: HGBA1C Urine Drug Screen: No results found for: LABOPIA, COCAINSCRNUR, LABBENZ, AMPHETMU, THCU, LABBARB  Alcohol Level No results found for: [place] I have personally reviewed the radiological images below and agree with the radiology interpretations.  CT Angio Chest Aorta W and/or Wo Contrast 07/03/2016 IMPRESSION: 1. Right-sided pulmonary embolism without findings for right heartstrain. 2. Tortuosity, ectasia and atherosclerotic calcifications involving  the thoracic aorta but no dissection. 3. Three-vessel coronary artery calcifications and surgical changes from coronary artery bypass surgery. 4. Bibasilar atelectasis but no definite infiltrates, edema or effusions. Aortic Atherosclerosis (ICD10-I70.0).  Vas [place] Lower Extremity Venous Duplex 07/04/2016 Summary:- Findings consistent with acute deep vein thrombosis involving the right peroneal vein. - No evidence of deep vein thrombosis involving the left lower extremity.  2D Echo 07/04/2016 Impressions: - LVEF 60-65%, moderate LVH, normal wall motion, grade 1 DD with normal LV filling pressure, dilated aortic root and ascending aorta, normal LA size, trivial TR, RVSP 18 mmHg, normal IVC.  Ct Head Wo Contrast 07/05/2016 IMPRESSION: In this patient who is anticoagulated, with altered mental status, there are no acute areas of parenchymal or subarachnoid hemorrhage. Stable appearing atrophy with extensive white matter disease. BILATERAL ocular banding procedures, as discussed above.   Luis Brain 73 Contrast Luis Cervical Spine Wo Contrast 07/06/2016 IMPRESSION: MRI HEAD: Mildly motion degraded examination. Acute small LEFT temporal lobe/ hippocampal infarct, less likely infectious etiology. Old basal ganglia lacunar infarcts and mild chronic small vessel ischemic disease.  MRI CERVICAL SPINE: Abnormal thoracic spinal cord signal partially imaged at T2. Mild suspected paraspinal muscle strain thoracic spine. Recommend MRI thoracic spine with and without contrast  when patient is able to tolerate further imaging. Limited four sequence MRI, motion degraded. Moderate to severe canal stenosis C4-5 suspected, moderate at C3-4. Nondiagnostic assessment of the neural foramen.  US Pelvis Limited 07/06/2016 IMPRESSION: Minimally distended bladder with some intraluminal debris. Dependent hyperdensity measuring 1.6 x 0.8 x 2.0 cm has no definite posterior shadowing. This may reflect small stones or clot.  Luis  Cervical and thoracic Spine Wo Contrast 07/08/2016 IMPRESSION: 1. Multiple osseous lesions within the thoracic spine, most notably at T3, T4, T9, and T11, suggesting metastatic disease. Multiple myeloma is also considered. 2. Extraosseous soft tissue mass likely reflecting tumor extension at the T3 level results in severe central canal stenosis with mass effect on the spinal cord. Abnormal cord signal a a mix extends from the midbody of T2 through the midbody of T4 . Central cord tumor is considered less likely. 3. Soft tissue signal changes posteriorly at the T2 level and extending above. This may reflect tumor or infection. 4. No significant extraosseous extension at the T9 or T10 levels. 5. Stable multilevel spondylosis in the cervical spine. 6. Heterogeneous marrow signal of the cervical spine appears be mostly discogenic. Additional sites of metastatic disease are not excluded. 7. The patient refused further imaging with contrast.   CT pelvis and abdomen contrast  Mild bibasilar subsegmental atelectasis. Nonobstructive 1 cm right renal calculus. Mild bilateral hydroureteronephrosis is noted without obstructing calculus. Urinary bladder is decompressed secondary to Foley catheter. Several bladder calculi are noted. 4.5 x 2.9 cm irregular lobulated mass appears to be involving the posterior and inferior wall of urinary bladder which is slightly enlarged compared to prior exam. This is concerning for invasive recurrent prostate cancer, and cystoscopy is recommended for further evaluation. Status post right hemicolectomy. Moderate small bowel dilatation is noted which extends to the surgical anastomosis in the right side of the abdomen, concerning for partial small bowel obstruction. Aortic atherosclerosis. Osseous metastatic lesions are noted in the left side of the pelvis which are increased compared to prior exam.  Transcranial Doppler w Bubbles a small PFO with spencer degree III at  rest.    PHYSICAL EXAM  Temp:  [98.4 F (36.9 C)-98.6 F (37 C)] 98.6 F (37 C) (06/22 0416) Pulse Rate:  [79-99] 99 (06/22 0416) Resp:  [17-18] 18 (06/22 0416) BP: (110-123)/(47-56) 123/53 (06/22 0416) SpO2:  [90 %-98 %] 96 % (06/22 0416)  General - Well nourished, well developed, awake alert and working with PT  Ophthalmologic - Fundi not visualized due to noncooperation.  Cardiovascular - Regular rate and rhythm.  Neuro - awake alert working with PT, orientated to place and people and year, however not orientated to month. Mild dysarthria, no aphasia or neglect. PERRL, left eye total blindness, right eye able to barely count fingers, no significant facial asymmetry, tongue in midline. Bilateral upper extremity equal strength with normal sensation. However, patient does have a T4-T6 sensory level. Bilateral lower extremity 0/5, hypotonia, with left positive Babinski and triple reflex. Right Babinski negative. Bilateral lower extremity diminished vibration and proprioception. Finger-to-nose grossly intact bilaterally. Gait not able to test.   ASSESSMENT/PLAN Luis. DURANT Pierce is a 83 y.o. male with history of left-eye blindness, chronic kidney disease, kidney stones and urinary incontinence, colon cancer ([place]), coronary artery disease, diverticulosis, DJD (degenerative joint disease), glaucoma, hyperlipemia, hypertension, melanoma of back ([place]), mild mitral regurgitation, prostate cancer ([place]), and urinary incontinence presenting with bilateral lower extremity weakness and numbness, slurred speech, and chest pain. He did not receive  IV t-PA due to arriving outside of the treatment window.  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    Sensory level T4-T6 with sensation lost in all modalities.   BLE 0/5 with spinal  shock except left babinski and triple reflex  NSG [name] aware and recommend decadron IV but not surgical candidate   CTA chest did not show any malignancy in the chest  CT abdomen/pelvis with contrast concerning for invasive recurrent prostate cancer with pelvic metastasis  Daughter is considering palliative care after rehab  Deposition - CIR pending  Stroke: Acute small left PCA and left MCA/ACA territory punctate infarcts, emboli pattern. Most likely due to paradoxical emboli in the setting of right DVT and small PFO.  Hypercoagulable state due to metastatic malignancy can not be completely ruled out.   CT head: no stroke  MRI head - Acute small LEFT temporal lobe/ hippocampal infarct as well as left posterior frontal infact  Carotid Doppler - not ordered as it will not change management.  2D Echo: EF 60-65%. No source of embolus.  TCD bubble study confirmed small PFO  CTA chest did not show malignancy in the chest  CT abdomen/pelvis concerning for invasive recurrent prostate cancer with pelvic metastasis  LDL 52  Heparin IV for VTE prophylaxis Diet regular Room service appropriate? Yes; Fluid consistency: Thin  aspirin 81 mg daily prior to admission, was on heparin IV and now switched to eliquis 2.'5mg'$  bid. Although not adequate dose for DVT treatment, pt continued to have persistent hematuria.   Patient counseled to be compliant with his antithrombotic medications  Ongoing aggressive stroke risk factor management  Therapy recommendations: CIR  Disposition:  pending  DVT and PE  LE venous doppler confirmed right DVT  CTA chest confirmed right PE  Was on heparin drip and now switched to eliquis 2.'5mg'$  bid. Although not adequate dose for DVT treatment, pt continued to have persistent hematuria.  Hematuria - ? Bladder cancer  Gross hematuria  heparin IV discontinued and now on eliquis 2.'5mg'$  bid.   On bladder irrigation   B12 deficiency  B12 level <  50  On B12 injection daily  Can not explain pt profound leg weakness, but contributed to pt LE sensory deficit.  AKI, improved  Cre 1.06->2.38->2.61->2.09->1.17->1.36  On IVF @ 75cc/h  Close monitoring  Extensive hx of cancer  Colon cancer s/p colectomy, stage I  Melanoma of the back s/p resection  Prostate cancer s/p TRUP  ? Bladder cancer   CTA chest did not show malignancy in the chest  CT abdomen/pelvis concerning for invasive recurrent prostate cancer with pelvic metastasis  Hyperlipidemia  Home meds: atorvastatin '40mg'$  PO daily, resumed in hospital  LDL 52, goal < 70  Continue statin at discharge  Other Stroke Risk Factors  Advanced age  ETOH use, advised to drink no more than 2 drink(s) a day  Coronary artery disease  Other Active Problems  Blind left eye   Decreased visual acuity right eye due to glaucoma   Urinary incontinence   Hospital day # 6   Neurology will sign off. Please call with questions. No neurology follow up needed at this time. Thanks for the consult.   Rosalin Hawking, MD PhD Stroke Neurology 07/09/2016 10:49 AM  To contact Stroke Continuity provider, please refer to [email or web address] After hours, contact General Neurology

## 2016-07-09 NOTE — Progress Notes (Signed)
Physical Therapy Treatment Patient Details Name: Luis Pierce MRN: [id number] DOB: 1922/12/03 Today's Date: 07/09/2016    History of Present Illness Pt adm with acute PE and no movement in LE's. Work up in progress. 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 MRI cervical spine-Abnormal thoracic spinal cord signal partially. 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 Pt also with UTI, acute kidney injury, and low B12. PMH - glaucoma with very poor vision, colon CA, prostate CA,     PT Comments    Patient making improvements with mobility - sitting balance improved with UE support.  Good UE strength.   Educated patient and daughter on pressure relief.  Patient good candidate for CIR.  Follow Up Recommendations  CIR     Equipment Recommendations  Other (comment) (TBD at next venue)    Recommendations for Other Services       Precautions / Restrictions Precautions Precautions: Fall Precaution Comments: watch BP; BLE wraps Restrictions Weight Bearing Restrictions: No    Mobility  Bed Mobility Overal bed mobility: Needs Assistance Bed Mobility: Rolling Rolling: Max assist         General bed mobility comments: Patient able to use UE's on rails to initiate roll and to roll upper body.  Assist required for lower body.  Transfers Overall transfer level: Needs assistance               General transfer comment: Total A to transfer to chair using maximove.   LE's wrapped with Ace bandages to assist with BP.  Ambulation/Gait             General Gait Details: N/A   Stairs            Wheelchair Mobility    Modified Rankin (Stroke Patients Only)       Balance Overall balance assessment: Needs assistance Sitting-balance support: Bilateral upper extremity supported;Feet supported Sitting balance-Leahy Scale: Poor Sitting balance - Comments: In chair, patient able to use UE's on armrests to pull trunk forward.  Able to maintain sitting balance with UE  support and min guard assist.  Worked on pulling self forward and controlling return to seat back.  Repeated x10.  Worked on lateral weight shifting for pressure relief to both sides x2.  Patient able to return self to midline position after leaning to each side.                                    Cognition Arousal/Alertness: Awake/alert Behavior During Therapy: WFL for tasks assessed/performed Overall Cognitive Status: Impaired/Different from baseline Area of Impairment: Problem solving;Awareness                       Following Commands: Follows one step commands consistently     Problem Solving: Slow processing;Requires verbal cues;Requires tactile cues General Comments: Pleasant.  Wants to get OOB.      Exercises      General Comments General comments (skin integrity, edema, etc.): Use of pressure relief cushion in chair, and floated heels using pillor.      Pertinent Vitals/Pain Pain Assessment: Faces Faces Pain Scale: No hurt    Home Living                      Prior Function            PT Goals (current goals can now be found in the care plan  section) Progress towards PT goals: Progressing toward goals    Frequency    Min 3X/week      PT Plan Current plan remains appropriate    Co-evaluation PT/OT/SLP Co-Evaluation/Treatment: Yes Reason for Co-Treatment: Complexity of the patient's impairments (multi-system involvement);For patient/therapist safety PT goals addressed during session: Mobility/safety with mobility        AM-PAC PT "6 Clicks" Daily Activity  Outcome Measure  Difficulty turning over in bed (including adjusting bedclothes, sheets and blankets)?: Total Difficulty moving from lying on back to sitting on the side of the bed? : Total Difficulty sitting down on and standing up from a chair with arms (e.g., wheelchair, bedside commode, etc,.)?: Total Help needed moving to and from a bed to chair (including a  wheelchair)?: Total Help needed walking in hospital room?: Total Help needed climbing 3-5 steps with a railing? : Total 6 Click Score: 6    End of Session Equipment Utilized During Treatment: Other (comment) (Ace wraps to BLE's) Activity Tolerance: Patient tolerated treatment well Patient left: in chair;with call bell/phone within reach;with family/visitor present Nurse Communication: Mobility status;Need for lift equipment (In chair) PT Visit Diagnosis: Muscle weakness (generalized) (M62.81);Difficulty in walking, not elsewhere classified (R26.2)     Time: 8937-3428 PT Time Calculation (min) (ACUTE ONLY): 31 min  Charges:  $Therapeutic Activity: 8-22 mins                    G Codes:       Carita Pian. Sanjuana Kava, [facility] Acute Rehab Services Pager [phone]    Despina Pole 07/09/2016, 12:58 PM

## 2016-07-09 NOTE — Progress Notes (Signed)
Occupational Therapy Treatment Patient Details Name: Luis Pierce MRN: [id number] DOB: [birth year] Today's Date: 07/09/2016    History of present illness Pt adm with acute PE and no movement in LE's. Work up in progress. 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 MRI cervical spine-Abnormal thoracic spinal cord signal partially. 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 Pt also with UTI, acute kidney injury, and low B12. PMH - glaucoma with very poor vision, colon CA, prostate CA,    OT comments  Pt much more alert and able to participate in activities today. Improvement noted in rolling and trunk control with UE support. Worked on pressure relief in chair. Educated daughter on importance of floating pt's heels in bed. Pt performed grooming seated in chair. Tolerated activity well and making jokes at end of session. Continue to recommending CIR.  Follow Up Recommendations  CIR    Equipment Recommendations  Wheelchair (measurements OT);Wheelchair cushion (measurements OT);Hospital bed    Recommendations for Other Services      Precautions / Restrictions Precautions Precautions: Fall Precaution Comments: watch BP; BLE wraps Restrictions Weight Bearing Restrictions: No       Mobility Bed Mobility Overal bed mobility: Needs Assistance Bed Mobility: Rolling Rolling: Max assist         General bed mobility comments: Patient able to use UE's on rails to initiate roll and to roll upper body.  Assist required for lower body.  Transfers Overall transfer level: Needs assistance               General transfer comment: Total A to transfer to chair using maximove.   LE's wrapped with Ace bandages to assist with BP.    Balance Overall balance assessment: Needs assistance Sitting-balance support: Bilateral upper extremity supported;Feet supported Sitting balance-Leahy Scale: Poor Sitting balance - Comments: In chair, patient able to use UE's on armrests to pull trunk forward.   Able to maintain sitting balance with UE support and min guard assist.  Worked on pulling self forward and controlling return to seat back.  Repeated x10.  Worked on lateral weight shifting for pressure relief to both sides x2.  Patient able to return self to midline position after leaning to each side.                                   ADL either performed or assessed with clinical judgement   ADL Overall ADL's : Needs assistance/impaired     Grooming: Wash/dry face;Sitting;Set up;Oral care;Minimal assistance                                       Vision       Perception     Praxis      Cognition Arousal/Alertness: Awake/alert Behavior During Therapy: WFL for tasks assessed/performed Overall Cognitive Status: Impaired/Different from baseline Area of Impairment: Problem solving;Awareness;Following commands                       Following Commands: Follows one step commands consistently (with multimodal cues due to blindness)     Problem Solving: Slow processing;Requires verbal cues;Requires tactile cues General Comments: Pleasant.  Wants to get OOB. Having visual hallucinations, daughter reports this is not unusual.        Exercises     Shoulder Instructions       General  Comments Use of pressure relief cushion in chair, and floated heels using pillor.    Pertinent Vitals/ Pain       Pain Assessment: Faces Faces Pain Scale: No hurt  Home Living                                          Prior Functioning/Environment              Frequency  Min 2X/week        Progress Toward Goals  OT Goals(current goals can now be found in the care plan section)  Progress towards OT goals: Progressing toward goals  Acute Rehab OT Goals Patient Stated Goal: return home OT Goal Formulation: With patient Time For Goal Achievement: 07/21/16 Potential to Achieve Goals: [place] Discharge plan remains  appropriate    Co-evaluation    PT/OT/SLP Co-Evaluation/Treatment: Yes Reason for Co-Treatment: For patient/therapist safety;Complexity of the patient's impairments (multi-system involvement) PT goals addressed during session: Mobility/safety with mobility OT goals addressed during session: ADL's and self-care      AM-PAC PT "6 Clicks" Daily Activity     Outcome Measure   Help from another person eating meals?: A Little Help from another person taking care of personal grooming?: A Little Help from another person toileting, which includes using toliet, bedpan, or urinal?: Total Help from another person bathing (including washing, rinsing, drying)?: Total Help from another person to put on and taking off regular upper body clothing?: A Lot Help from another person to put on and taking off regular lower body clothing?: Total 6 Click Score: 11    End of Session    OT Visit Diagnosis: Muscle weakness (generalized) (M62.81);History of falling (Z91.81);Other symptoms and signs involving cognitive function;Low vision, both eyes (H54.2)   Activity Tolerance Patient tolerated treatment well   Patient Left in chair;with call bell/phone within reach;with family/visitor present;with chair alarm set   Nurse Communication  (ok to get pt up to chair with lift)        Time: 1105-1136 OT Time Calculation (min): 31 min  Charges: OT General Charges $OT Visit: 1 Procedure OT Treatments $Therapeutic Activity: 8-22 mins    Malka So 07/09/2016, 1:17 PM  [phone]

## 2016-07-09 NOTE — Progress Notes (Signed)
PROGRESS NOTE    Luis Pierce  JIV:[id number] DOB: 1922/05/13 DOA: 07/03/2016 PCP: Merlene Laughter, MD    Brief Narrative:  83 year old gentleman, with prior h/o hypertension, CAD h/o CABG, glaucoma, retinal detachment with poor vision, h/o colon cancer, h/o prostate cancer, urinary incontinence w/p artificial sphincter, presented to ED with generalized weakness and back pain, not able to walk. Work up showed a PE and right sided DVT. Work up for back pain with MRI of the thoracic spine and cervical spine shows vertebral metastases. MRI brain shows a left hippocampal/ temporal lobe infarct. Neurology and neuro surgery consulted and recommendations given.   as per neuro surgery not a good surgical candidate, work with IV steroids to see if she improves, to use back brace if he complains of pain, palliative radiation not indicated at this time. PT consulted and recommended CIR had insurance authorization and we will plan to discharge him to CIR in am.    Assessment & Plan:   Principal Problem:   Bilateral leg weakness Active Problems:   Pulmonary embolism (HCC)   Generalized weakness   Hypertension   Hyperlipidemia   Gross hematuria   Hypothermia   Dehydration   Maureen Ralphs syndrome   Urinary incontinence, male, stress   Pulmonary embolism on right [facility])   Thoracic back pain   Acute lower UTI   B12 deficiency   ARF (acute renal failure) (HCC)   Leg weakness   Cerebral embolism with cerebral infarction   Pressure injury of skin   DVT (deep vein thrombosis) in pregnancy (HCC)  Bilateral leg weakness/ Paraplegia Possibly from the thoracic vertebral metastases, plan to continue with IV steroids to see if he has any improvement in the weakness.  Plan for discharge to CIR in am.    Maureen Ralphs syndrome:  Ativan prn as needed.    Acute blood loss anemia from gross hematuria:  Came in with a hemoglobin of 14 , and is around 7.7 to 8.2 .  Ordered a unit of prbc transfusion.    Repeat H&H in am.    Hypertension:  Well controlled.   Hyperlipidemia:  Resume statin.   Vitamin B12 deficiency:  Started on IM vitamin b12 injections.    Gross hematuria:  Pt has stress urinary incontinence s/p artificial urinary sphincter placement.  AUS was de activated and foley was placed in consultation with urology on 6/17.  Persistent hematuria, as imaging shows multiple stones and recurrent prostate cancer He was initially started on CBI and its on hold for now.    Acute PE and DVT:  In view of the ongoing hematuria and stage 3 CKD, pt is started on eliquis 2.5 mg BID.    Acute renal failure on stage 3 CKD as per his creatinine clearance:  Possibly from acute illness and NSAID USE and marginal blood pressures on admission.  No hydronephrosis on US renal initially. But CT abd and pelvis on 6/21 shows some Mild bilateral hydroureteronephrosis without obstructing calculus. His creatinine is 1.3 today.     Left hippocampal/ left temporal lobe Stroke:  Neurology consulted and he underwent Bubble study showing a small PFO.    Invasive and recurrent Prostate Cancer:  No further treatment as per family. Possibly home with hospice/ palliative service on discharge from CIR.   Stage 1 Pressure Ulcer of the buttocks:  - wound care consulted and recommendations given.    DVT prophylaxis: eliquis Code Status: full code.  Family Communication: discussed with daughter at bedside.  Disposition Plan: CIR in am.    Consultants:   NEURO SURGERY    Procedures: Bubble study.    Antimicrobials: none.    Subjective: Was awake most of the night.  Appears out of sorts. But able to follow commands and answer questions appropriately.   Objective: Vitals:   07/08/16 1954 07/09/16 0200 07/09/16 0416 07/09/16 1053  BP: (!) 110/53 (!) 110/56 (!) 123/53 108/68  Pulse: 88 80 99 100  Resp: '18  18 16  '$ Temp: 98.4 F (36.9 C)  98.6 F (37 C) 97.4 F (36.3 C)  TempSrc:  Oral  Oral Oral  SpO2: 90% 98% 96% 95%  Weight:      Height:        Intake/Output Summary (Last 24 hours) at 07/09/16 1134 Last data filed at 07/09/16 0834  Gross per 24 hour  Intake              200 ml  Output              751 ml  Net             -551 ml   Filed Weights   07/05/16 0420 07/06/16 0547 07/07/16 0500  Weight: 80.9 kg (178 lb 6.4 oz) 84.2 kg (185 lb 11.2 oz) 86 kg (189 lb 9.6 oz)    Examination:  General exam: comfortable.  Respiratory system: Clear to auscultation. Respiratory effort normal. Cardiovascular system: s1s2, murmer present, RRR.  Gastrointestinal system: abd is soft NT,mildly distended, bowel sounds hypoactive.  Central nervous system: Alert and appears oriented. Continues to have lower extremity weakness.  Extremities: no pedal edema.  Skin: stage 1 pressure ulcer over the buttocks.  Psychiatry: mood appropriate.     Data Reviewed: I have personally reviewed following labs and imaging studies  CBC:  Recent Labs Lab 07/03/16 0819  07/04/16 0958 07/05/16 0528 07/06/16 0321 07/07/16 1308 07/08/16 0334 07/08/16 2036 07/09/16 0228  WBC 12.6*  --  14.1* 15.4* 14.3*  --  12.7*  --  13.6*  NEUTROABS 11.0*  --  11.7*  --   --   --   --   --   --   HGB 14.4  --  13.8 11.5* 9.5* 8.8* 8.2* 7.7* 8.2*  HCT 42.8  < > 40.2 34.5* 28.9* 27.1* 25.4* 23.3* 25.1*  MCV 88.6  --  88.9 90.8 89.5  --  91.7  --  90.3  PLT 130*  --  123* 159 178  --  224  --  249  < > = values in this interval not displayed. Basic Metabolic Panel:  Recent Labs Lab 07/05/16 0528 07/06/16 0321 07/07/16 0337 07/08/16 0334 07/09/16 0228  NA 135 137 137 138 137  K 4.3 4.3 4.0 3.9 4.0  CL 107 110 110 112* 111  CO2 18* 19* 17* 21* 20*  GLUCOSE 121* 110* 127* 104* 121*  BUN 48* 66* 74* 59* 50*  CREATININE 2.38* 2.61* 2.09* 1.17 1.36*  CALCIUM 8.4* 7.9* 8.2* 8.2* 8.2*   GFR: Estimated Creatinine Clearance: 36.5 mL/min (A) (by C-G formula based on SCr of 1.36 mg/dL  (H)). Liver Function Tests:  Recent Labs Lab 07/03/16 0819 07/03/16 1803 07/04/16 0952  AST 32  --  27  ALT 17  --  16*  ALKPHOS 191*  --  187*  BILITOT 1.8* 1.4* 0.8  PROT 5.9*  --  5.4*  ALBUMIN 3.1*  --  2.8*   No results for input(s): LIPASE, AMYLASE in the last  168 hours. No results for input(s): AMMONIA in the last 168 hours. Coagulation Profile:  Recent Labs Lab 07/04/16 0952  INR 1.48   Cardiac Enzymes: No results for input(s): CKTOTAL, CKMB, CKMBINDEX, TROPONINI in the last 168 hours. BNP (last 3 results) No results for input(s): PROBNP in the last 8760 hours. HbA1C: No results for input(s): HGBA1C in the last 72 hours. CBG:  Recent Labs Lab 07/03/16 1516  GLUCAP 118*   Lipid Profile:  Recent Labs  07/08/16 0334  CHOL 102  HDL 24*  LDLCALC 52  TRIG 128  CHOLHDL 4.3   Thyroid Function Tests: No results for input(s): TSH, T4TOTAL, FREET4, T3FREE, THYROIDAB in the last 72 hours. Anemia Panel: No results for input(s): VITAMINB12, FOLATE, FERRITIN, TIBC, IRON, RETICCTPCT in the last 72 hours. Sepsis Labs: No results for input(s): PROCALCITON, LATICACIDVEN in the last 168 hours.  Recent Results (from the past 240 hour(s))  Urine culture     Status: Abnormal   Collection Time: 07/03/16  3:16 PM  Result Value Ref Range Status   Specimen Description URINE, RANDOM  Final   Special Requests NONE  Final   Culture <10,000 COLONIES/mL INSIGNIFICANT GROWTH (A)  Final   Report Status 07/04/2016 FINAL  Final  Culture, blood (Routine X 2) w Reflex to ID Panel     Status: None   Collection Time: 07/03/16  6:08 PM  Result Value Ref Range Status   Specimen Description BLOOD LEFT HAND  Final   Special Requests IN PEDIATRIC BOTTLE Blood Culture adequate volume  Final   Culture NO GROWTH 5 DAYS  Final   Report Status 07/08/2016 FINAL  Final  Culture, blood (Routine X 2) w Reflex to ID Panel     Status: None   Collection Time: 07/03/16  6:13 PM  Result Value  Ref Range Status   Specimen Description BLOOD RIGHT ANTECUBITAL  Final   Special Requests IN PEDIATRIC BOTTLE Blood Culture adequate volume  Final   Culture NO GROWTH 5 DAYS  Final   Report Status 07/08/2016 FINAL  Final         Radiology Studies: Mr Cervical Spine Wo Contrast  Result Date: 07/08/2016 CLINICAL DATA:  Episode of weakness and numbness in the lower extremities bilaterally 07/03/2016. Acute/subacute left temporal lobe infarct. Abnormal signal within the upper thoracic spinal cord on the cervical spine MRI. The examination had to be discontinued prior to completion due to patient refusal for further imaging. EXAM: MRI CERVICAL AND THORACIC SPINE WITHOUT CONTRAST TECHNIQUE: Multiplanar and multiecho pulse sequences of the cervical spine, to include the craniocervical junction and cervicothoracic junction, and thoracic spine, were obtained without intravenous contrast. COMPARISON:  MRI of cervical spine 07/06/2016. FINDINGS: MRI CERVICAL SPINE FINDINGS Alignment: AP alignment is anatomic. There straightening of the normal cervical lordosis. Vertebrae: Chronic endplate marrow changes are again noted at C4-5 and on the right at C6-7. Vertebral body heights are maintained. Cord: Cervical spinal cord signal is normal. Posterior Fossa, vertebral arteries, paraspinal tissues: The craniocervical junction is normal. The visualized intracranial contents are normal. Flow is present in the vertebral arteries bilaterally. Disc levels: Disc disease is not significantly changed. C2-3: Moderate right foraminal narrowing is due to uncovertebral and facet spurring. C3-4: A broad-based disc osteophyte complex and facet hypertrophy contributes to moderate left greater than right foraminal narrowing and moderate right central canal stenosis. C4-5: Moderate to severe central canal stenosis is present. There is distortion of the spinal cord. The canal is narrowed to 7 mm. Severe  foraminal stenosis is worse on the  left. C5-6: A rightward disc osteophyte complex is present. There is partial effacement of the ventral CSF. Moderate right and mild left foraminal narrowing is present. C6-7: Uncovertebral spurring bilaterally is stable. Mild foraminal narrowing is worse on the left. C7-T1: Moderate facet hypertrophy is present bilaterally without significant stenosis. MRI THORACIC SPINE FINDINGS Alignment:  AP alignment is anatomic. Vertebrae: There is diffuse heterogeneous marrow signal. Abnormal signal is present diffusely within the T3 vertebral body involving the posterior elements bilaterally. There is abnormal signal on the right at T4 extending into the posterior elements. Epidural soft tissue extends cephalo caudad 4.3 cm from the mid body aches T2-3 the midbody of T4. This results in significant central canal stenosis. There is abnormal signal within the spinal cord at this level. Extensive T2 signal changes are present within the soft tissues posterior to the thoracic spine at this level as well. The lesion at T9 measures 3.1 x 1.9 cm on the right. A smaller superior right-sided lesion measures 9 mm. A 10.5 mm lesion is evident posteriorly and inferiorly on the left at T11. No other definite involvement of posterior elements is noted. There is also abnormal signal in the right pedicle of T4. Cord: Abnormal cord signal is present from the midbody of T2 through T4 associated with cord compression by extraosseous soft tissue mass. Paraspinal and other soft tissues: Abnormal signal is present within the posterior paraspinal soft tissues T2-4. Disc levels: No significant focal disc protrusion is present. Facet hypertrophy contributes to mild right foraminal narrowing at T2-3. The foramina are otherwise patent. No other significant central canal stenosis is present. IMPRESSION: 1. Multiple osseous lesions within the thoracic spine, most notably at T3, T4, T9, and T11, suggesting metastatic disease. Multiple myeloma is also  considered. 2. Extraosseous soft tissue mass likely reflecting tumor extension at the T3 level results in severe central canal stenosis with mass effect on the spinal cord. Abnormal cord signal a a mix extends from the midbody of T2 through the midbody of T4 . Central cord tumor is considered less likely. 3. Soft tissue signal changes posteriorly at the T2 level and extending above. This may reflect tumor or infection. 4. No significant extraosseous extension at the T9 or T10 levels. 5. Stable multilevel spondylosis in the cervical spine. 6. Heterogeneous marrow signal of the cervical spine appears be mostly discogenic. Additional sites of metastatic disease are not excluded. 7. The patient refused further imaging with contrast. Critical Value/emergent results were called by telephone at the time of interpretation on 07/08/2016 at 11:51 am to Dr. Rosalin Hawking , who verbally acknowledged these results. Electronically Signed   By: San Morelle M.D.   On: 07/08/2016 12:23   Mr Thoracic Spine Wo Contrast  Result Date: 07/08/2016 CLINICAL DATA:  Episode of weakness and numbness in the lower extremities bilaterally 07/03/2016. Acute/subacute left temporal lobe infarct. Abnormal signal within the upper thoracic spinal cord on the cervical spine MRI. The examination had to be discontinued prior to completion due to patient refusal for further imaging. EXAM: MRI CERVICAL AND THORACIC SPINE WITHOUT CONTRAST TECHNIQUE: Multiplanar and multiecho pulse sequences of the cervical spine, to include the craniocervical junction and cervicothoracic junction, and thoracic spine, were obtained without intravenous contrast. COMPARISON:  MRI of cervical spine 07/06/2016. FINDINGS: MRI CERVICAL SPINE FINDINGS Alignment: AP alignment is anatomic. There straightening of the normal cervical lordosis. Vertebrae: Chronic endplate marrow changes are again noted at C4-5 and on the right at C6-7.  Vertebral body heights are maintained.  Cord: Cervical spinal cord signal is normal. Posterior Fossa, vertebral arteries, paraspinal tissues: The craniocervical junction is normal. The visualized intracranial contents are normal. Flow is present in the vertebral arteries bilaterally. Disc levels: Disc disease is not significantly changed. C2-3: Moderate right foraminal narrowing is due to uncovertebral and facet spurring. C3-4: A broad-based disc osteophyte complex and facet hypertrophy contributes to moderate left greater than right foraminal narrowing and moderate right central canal stenosis. C4-5: Moderate to severe central canal stenosis is present. There is distortion of the spinal cord. The canal is narrowed to 7 mm. Severe foraminal stenosis is worse on the left. C5-6: A rightward disc osteophyte complex is present. There is partial effacement of the ventral CSF. Moderate right and mild left foraminal narrowing is present. C6-7: Uncovertebral spurring bilaterally is stable. Mild foraminal narrowing is worse on the left. C7-T1: Moderate facet hypertrophy is present bilaterally without significant stenosis. MRI THORACIC SPINE FINDINGS Alignment:  AP alignment is anatomic. Vertebrae: There is diffuse heterogeneous marrow signal. Abnormal signal is present diffusely within the T3 vertebral body involving the posterior elements bilaterally. There is abnormal signal on the right at T4 extending into the posterior elements. Epidural soft tissue extends cephalo caudad 4.3 cm from the mid body aches T2-3 the midbody of T4. This results in significant central canal stenosis. There is abnormal signal within the spinal cord at this level. Extensive T2 signal changes are present within the soft tissues posterior to the thoracic spine at this level as well. The lesion at T9 measures 3.1 x 1.9 cm on the right. A smaller superior right-sided lesion measures 9 mm. A 10.5 mm lesion is evident posteriorly and inferiorly on the left at T11. No other definite  involvement of posterior elements is noted. There is also abnormal signal in the right pedicle of T4. Cord: Abnormal cord signal is present from the midbody of T2 through T4 associated with cord compression by extraosseous soft tissue mass. Paraspinal and other soft tissues: Abnormal signal is present within the posterior paraspinal soft tissues T2-4. Disc levels: No significant focal disc protrusion is present. Facet hypertrophy contributes to mild right foraminal narrowing at T2-3. The foramina are otherwise patent. No other significant central canal stenosis is present. IMPRESSION: 1. Multiple osseous lesions within the thoracic spine, most notably at T3, T4, T9, and T11, suggesting metastatic disease. Multiple myeloma is also considered. 2. Extraosseous soft tissue mass likely reflecting tumor extension at the T3 level results in severe central canal stenosis with mass effect on the spinal cord. Abnormal cord signal a a mix extends from the midbody of T2 through the midbody of T4 . Central cord tumor is considered less likely. 3. Soft tissue signal changes posteriorly at the T2 level and extending above. This may reflect tumor or infection. 4. No significant extraosseous extension at the T9 or T10 levels. 5. Stable multilevel spondylosis in the cervical spine. 6. Heterogeneous marrow signal of the cervical spine appears be mostly discogenic. Additional sites of metastatic disease are not excluded. 7. The patient refused further imaging with contrast. Critical Value/emergent results were called by telephone at the time of interpretation on 07/08/2016 at 11:51 am to Dr. Rosalin Hawking , who verbally acknowledged these results. Electronically Signed   By: San Morelle M.D.   On: 07/08/2016 12:23   Ct Abdomen Pelvis W Contrast  Result Date: 07/08/2016 CLINICAL DATA:  Hematuria.  History of prostate cancer. EXAM: CT ABDOMEN AND PELVIS WITH CONTRAST TECHNIQUE:  Multidetector CT imaging of the abdomen and pelvis  was performed using the standard protocol following bolus administration of intravenous contrast. CONTRAST:  21m ISOVUE-300 IOPAMIDOL (ISOVUE-300) INJECTION 61% COMPARISON:  CT scan of January 26, 2016. FINDINGS: Lower chest: Mild bibasilar posterior subsegmental atelectasis is noted. Hepatobiliary: No focal liver abnormality is seen. Status post cholecystectomy. No biliary dilatation. Pancreas: Unremarkable. No pancreatic ductal dilatation or surrounding inflammatory changes. Spleen: Normal in size without focal abnormality. Adrenals/Urinary Tract: Adrenal glands are unremarkable. Bilateral parapelvic cysts are noted. Nonobstructive 1 cm calculus is seen in middle pole infundibulum. Mild bilateral hydroureteronephrosis is noted without obstructing calculus. Urinary bladder is decompressed secondary to Foley catheter. Several bladder calculi are noted. 4.5 x 2.9 cm irregular lobulated mass appears to be involving the posterior wall of the urinary bladder which is slightly enlarged compared to prior exam. This is most consistent with invasive and recurrent prostate cancer. Cystoscopy is recommended for further evaluation. Stomach/Bowel: Stomach is unremarkable. Status post right hemicolectomy, with small bowel dilatation extending to the surgical anastomosis. This is concerning for at least partial small bowel obstruction. Vascular/Lymphatic: Aortic atherosclerosis. No enlarged abdominal or pelvic lymph nodes. Reproductive: As noted above, 4.5 x 2.9 cm lobulated mass is seen in posterior inferior portion of urinary bladder concerning for invasive and recurrent prostate cancer. Other: Penile reservoir is noted in left lower quadrant. No hernia or abnormal fluid collection is noted. Musculoskeletal: 2 sclerotic lesions are noted in the left iliac wing which are increased compared to prior exam and consistent with metastatic disease. Stable sclerotic density seen in right iliac wing concerning for metastatic disease.  New metastatic focus is noted in inferior portion of left ischial tuberosity. IMPRESSION: Mild bibasilar subsegmental atelectasis. Nonobstructive 1 cm right renal calculus. Mild bilateral hydroureteronephrosis is noted without obstructing calculus. Urinary bladder is decompressed secondary to Foley catheter. Several bladder calculi are noted. 4.5 x 2.9 cm irregular lobulated mass appears to be involving the posterior and inferior wall of urinary bladder which is slightly enlarged compared to prior exam. This is concerning for invasive recurrent prostate cancer, and cystoscopy is recommended for further evaluation. Status post right hemicolectomy. Moderate small bowel dilatation is noted which extends to the surgical anastomosis in the right side of the abdomen, concerning for partial small bowel obstruction. Aortic atherosclerosis. Osseous metastatic lesions are noted in the left side of the pelvis which are increased compared to prior exam. Electronically Signed   By: JMarijo Conception M.D.   On: 07/08/2016 15:55        Scheduled Meds: . apixaban  2.5 mg Oral BID  . atorvastatin  40 mg Oral q1800  . calcium carbonate  1 tablet Oral Once  . cyanocobalamin  1,000 mcg Intramuscular Daily  . dexamethasone  10 mg Intravenous Q6H  . dorzolamide-timolol  1 drop Right Eye BID  . fentaNYL (SUBLIMAZE) injection  50 mcg Intravenous Once  . latanoprost  1 drop Right Eye QHS  . mouth rinse  15 mL Mouth Rinse q12n4p  . nitroGLYCERIN      . Vitamin D (Ergocalciferol)  50,000 Units Oral Q7 days   Continuous Infusions: . sodium chloride 75 mL/hr at 07/09/16 0531  . sodium chloride    . sodium chloride irrigation       LOS: 6 days    Time spent: 40  Minutes.     AHosie Poisson MD [facility] Pager [phone]  If 7PM-7AM, please contact night-coverage [email or web address] Password [facility]6/22/2018, 11:34 AM

## 2016-07-09 NOTE — Progress Notes (Signed)
Pt complained of left side chest pain. Denied pain anywhere else. Pain was brief; was gone before RN was able to give medication. Vital signs stable. EKG in chart. MD aware. Pt completely denies any pain at this time. No new orders at this time. Will continue to monitor.

## 2016-07-10 ENCOUNTER — Inpatient Hospital Stay (HOSPITAL_COMMUNITY): Payer: Medicare Other

## 2016-07-10 ENCOUNTER — Encounter (HOSPITAL_COMMUNITY): Payer: Self-pay

## 2016-07-10 ENCOUNTER — Inpatient Hospital Stay (HOSPITAL_COMMUNITY)
Admit: 2016-07-10 | Discharge: 2016-07-18 | DRG: 052 | Disposition: E | Payer: Medicare Other | Source: Intra-hospital | Attending: Physical Medicine & Rehabilitation | Admitting: Physical Medicine & Rehabilitation

## 2016-07-10 DIAGNOSIS — C7951 Secondary malignant neoplasm of bone: Secondary | ICD-10-CM | POA: Diagnosis not present

## 2016-07-10 DIAGNOSIS — K592 Neurogenic bowel, not elsewhere classified: Secondary | ICD-10-CM | POA: Diagnosis not present

## 2016-07-10 DIAGNOSIS — I2699 Other pulmonary embolism without acute cor pulmonale: Secondary | ICD-10-CM | POA: Diagnosis present

## 2016-07-10 DIAGNOSIS — N170 Acute kidney failure with tubular necrosis: Secondary | ICD-10-CM | POA: Diagnosis present

## 2016-07-10 DIAGNOSIS — N390 Urinary tract infection, site not specified: Secondary | ICD-10-CM | POA: Diagnosis present

## 2016-07-10 DIAGNOSIS — K560 Paralytic ileus: Secondary | ICD-10-CM | POA: Diagnosis present

## 2016-07-10 DIAGNOSIS — Z85820 Personal history of malignant melanoma of skin: Secondary | ICD-10-CM

## 2016-07-10 DIAGNOSIS — I129 Hypertensive chronic kidney disease with stage 1 through stage 4 chronic kidney disease, or unspecified chronic kidney disease: Secondary | ICD-10-CM | POA: Diagnosis present

## 2016-07-10 DIAGNOSIS — Z951 Presence of aortocoronary bypass graft: Secondary | ICD-10-CM

## 2016-07-10 DIAGNOSIS — G9520 Unspecified cord compression: Secondary | ICD-10-CM

## 2016-07-10 DIAGNOSIS — D500 Iron deficiency anemia secondary to blood loss (chronic): Secondary | ICD-10-CM | POA: Diagnosis present

## 2016-07-10 DIAGNOSIS — N319 Neuromuscular dysfunction of bladder, unspecified: Secondary | ICD-10-CM | POA: Diagnosis not present

## 2016-07-10 DIAGNOSIS — H409 Unspecified glaucoma: Secondary | ICD-10-CM | POA: Diagnosis present

## 2016-07-10 DIAGNOSIS — Z09 Encounter for follow-up examination after completed treatment for conditions other than malignant neoplasm: Secondary | ICD-10-CM

## 2016-07-10 DIAGNOSIS — Z8249 Family history of ischemic heart disease and other diseases of the circulatory system: Secondary | ICD-10-CM

## 2016-07-10 DIAGNOSIS — E538 Deficiency of other specified B group vitamins: Secondary | ICD-10-CM | POA: Diagnosis present

## 2016-07-10 DIAGNOSIS — N189 Chronic kidney disease, unspecified: Secondary | ICD-10-CM | POA: Diagnosis present

## 2016-07-10 DIAGNOSIS — Z85038 Personal history of other malignant neoplasm of large intestine: Secondary | ICD-10-CM

## 2016-07-10 DIAGNOSIS — G8220 Paraplegia, unspecified: Secondary | ICD-10-CM | POA: Diagnosis not present

## 2016-07-10 DIAGNOSIS — R063 Periodic breathing: Secondary | ICD-10-CM | POA: Diagnosis not present

## 2016-07-10 DIAGNOSIS — Z8546 Personal history of malignant neoplasm of prostate: Secondary | ICD-10-CM | POA: Diagnosis not present

## 2016-07-10 DIAGNOSIS — R32 Unspecified urinary incontinence: Secondary | ICD-10-CM | POA: Diagnosis present

## 2016-07-10 DIAGNOSIS — R319 Hematuria, unspecified: Secondary | ICD-10-CM | POA: Diagnosis present

## 2016-07-10 DIAGNOSIS — R159 Full incontinence of feces: Secondary | ICD-10-CM | POA: Diagnosis present

## 2016-07-10 DIAGNOSIS — E785 Hyperlipidemia, unspecified: Secondary | ICD-10-CM | POA: Diagnosis present

## 2016-07-10 DIAGNOSIS — I6340 Cerebral infarction due to embolism of unspecified cerebral artery: Secondary | ICD-10-CM | POA: Diagnosis present

## 2016-07-10 DIAGNOSIS — H548 Legal blindness, as defined in USA: Secondary | ICD-10-CM | POA: Diagnosis present

## 2016-07-10 DIAGNOSIS — I82491 Acute embolism and thrombosis of other specified deep vein of right lower extremity: Secondary | ICD-10-CM | POA: Diagnosis present

## 2016-07-10 DIAGNOSIS — H5462 Unqualified visual loss, left eye, normal vision right eye: Secondary | ICD-10-CM | POA: Diagnosis present

## 2016-07-10 DIAGNOSIS — L89300 Pressure ulcer of unspecified buttock, unstageable: Secondary | ICD-10-CM | POA: Diagnosis present

## 2016-07-10 DIAGNOSIS — I340 Nonrheumatic mitral (valve) insufficiency: Secondary | ICD-10-CM | POA: Diagnosis present

## 2016-07-10 DIAGNOSIS — I2510 Atherosclerotic heart disease of native coronary artery without angina pectoris: Secondary | ICD-10-CM | POA: Diagnosis present

## 2016-07-10 DIAGNOSIS — R109 Unspecified abdominal pain: Secondary | ICD-10-CM

## 2016-07-10 DIAGNOSIS — R4781 Slurred speech: Secondary | ICD-10-CM

## 2016-07-10 DIAGNOSIS — Z66 Do not resuscitate: Secondary | ICD-10-CM | POA: Diagnosis present

## 2016-07-10 LAB — BASIC METABOLIC PANEL
ANION GAP: 6 (ref 5–15)
BUN: 48 mg/dL — AB (ref 6–20)
CO2: 20 mmol/L — AB (ref 22–32)
Calcium: 8.5 mg/dL — ABNORMAL LOW (ref 8.9–10.3)
Chloride: 113 mmol/L — ABNORMAL HIGH (ref 101–111)
Creatinine, Ser: 0.91 mg/dL (ref 0.61–1.24)
GFR calc Af Amer: >60 mL/min (ref >60)
GFR calc non Af Amer: >60 mL/min (ref >60)
GLUCOSE: 150 mg/dL — AB (ref 65–99)
POTASSIUM: 4.2 mmol/L (ref 3.5–5.1)
Sodium: 139 mmol/L (ref 135–145)

## 2016-07-10 LAB — BPAM RBC
BLOOD PRODUCT EXPIRATION DATE: 201806292359
ISSUE DATE / TIME: 201806221331
Unit Type and Rh: 6200

## 2016-07-10 LAB — CBC
HEMATOCRIT: 28.8 % — AB (ref 39.0–52.0)
Hemoglobin: 9.4 g/dL — ABNORMAL LOW (ref 13.0–17.0)
MCH: 28.7 pg (ref 26.0–34.0)
MCHC: 32.6 g/dL (ref 30.0–36.0)
MCV: 87.8 fL (ref 78.0–100.0)
Platelets: 303 10*3/uL (ref 150–400)
RBC: 3.28 MIL/uL — ABNORMAL LOW (ref 4.22–5.81)
RDW: 16.7 % — AB (ref 11.5–15.5)
WBC: 19.6 10*3/uL — ABNORMAL HIGH (ref 4.0–10.5)

## 2016-07-10 LAB — TYPE AND SCREEN
ABO/RH(D): A POS
Antibody Screen: NEGATIVE
Unit division: 0

## 2016-07-10 MED ORDER — APIXABAN 5 MG PO TABS: 5.0000 mg | ORAL_TABLET | Freq: Two times a day (BID) | ORAL | Status: AC

## 2016-07-10 MED ORDER — VITAMIN D (ERGOCALCIFEROL) 1.25 MG (50000 UNIT) PO CAPS: 50000.0000 [IU] | ORAL_CAPSULE | ORAL | 0 refills | Status: AC

## 2016-07-10 MED ORDER — PROCHLORPERAZINE MALEATE 5 MG PO TABS: 5.0000 mg | ORAL_TABLET | Freq: Four times a day (QID) | ORAL | Status: DC | PRN

## 2016-07-10 MED ORDER — CALCIUM CARBONATE ANTACID 500 MG PO CHEW: 1.0000 | CHEWABLE_TABLET | Freq: Once | ORAL | 0 refills | Status: DC

## 2016-07-10 MED ORDER — DIPHENHYDRAMINE HCL 12.5 MG/5ML PO ELIX: 12.5000 mg | ORAL_SOLUTION | Freq: Four times a day (QID) | ORAL | Status: DC | PRN

## 2016-07-10 MED ORDER — SENNOSIDES-DOCUSATE SODIUM 8.6-50 MG PO TABS: 1.0000 | ORAL_TABLET | Freq: Every evening | ORAL | Status: DC | PRN

## 2016-07-10 MED ORDER — ACETAMINOPHEN 325 MG PO TABS: 325.0000 mg | ORAL_TABLET | ORAL | Status: DC | PRN

## 2016-07-10 MED ORDER — CYANOCOBALAMIN 1000 MCG/ML IJ SOLN: 1000.0000 ug | INTRAMUSCULAR | 0 refills | Status: AC

## 2016-07-10 MED ORDER — CYANOCOBALAMIN 1000 MCG/ML IJ SOLN
1000.0000 ug | Freq: Every day | INTRAMUSCULAR | Status: AC
  Administered 2016-07-10: 1000 ug via INTRAMUSCULAR
  Filled 2016-07-10: qty 1

## 2016-07-10 MED ORDER — APIXABAN 5 MG PO TABS
5.0000 mg | ORAL_TABLET | Freq: Two times a day (BID) | ORAL | Status: DC
  Administered 2016-07-10: 5 mg via ORAL
  Filled 2016-07-10: qty 1

## 2016-07-10 MED ORDER — GLUCERNA SHAKE PO LIQD
237.0000 mL | Freq: Three times a day (TID) | ORAL | Status: DC
  Administered 2016-07-10 – 2016-07-12 (×5): 237 mL via ORAL

## 2016-07-10 MED ORDER — BISACODYL 10 MG RE SUPP: 10.0000 mg | Freq: Every day | RECTAL | Status: DC | PRN

## 2016-07-10 MED ORDER — TRAZODONE HCL 50 MG PO TABS: 25.0000 mg | ORAL_TABLET | Freq: Every evening | ORAL | Status: DC | PRN

## 2016-07-10 MED ORDER — DORZOLAMIDE HCL-TIMOLOL MAL 2-0.5 % OP SOLN
1.0000 [drp] | Freq: Two times a day (BID) | OPHTHALMIC | Status: DC
  Administered 2016-07-10 – 2016-07-16 (×11): 1 [drp] via OPHTHALMIC
  Filled 2016-07-10: qty 10

## 2016-07-10 MED ORDER — DEXAMETHASONE SODIUM PHOSPHATE 10 MG/ML IJ SOLN: 4.0000 mg | Freq: Four times a day (QID) | INTRAMUSCULAR | 0 refills | Status: DC

## 2016-07-10 MED ORDER — HYDROCODONE-ACETAMINOPHEN 5-325 MG PO TABS
1.0000 | ORAL_TABLET | Freq: Four times a day (QID) | ORAL | Status: DC | PRN
  Administered 2016-07-11: 1 via ORAL
  Filled 2016-07-10: qty 1

## 2016-07-10 MED ORDER — LATANOPROST 0.005 % OP SOLN
1.0000 [drp] | Freq: Every day | OPHTHALMIC | Status: DC
  Administered 2016-07-10 – 2016-07-16 (×6): 1 [drp] via OPHTHALMIC
  Filled 2016-07-10: qty 2.5

## 2016-07-10 MED ORDER — GUAIFENESIN-DM 100-10 MG/5ML PO SYRP: 5.0000 mL | ORAL_SOLUTION | Freq: Four times a day (QID) | ORAL | Status: DC | PRN

## 2016-07-10 MED ORDER — PROCHLORPERAZINE EDISYLATE 5 MG/ML IJ SOLN: 5.0000 mg | Freq: Four times a day (QID) | INTRAMUSCULAR | Status: DC | PRN

## 2016-07-10 MED ORDER — SODIUM CHLORIDE 0.9 % IR SOLN: 3000.0000 mL | Status: DC

## 2016-07-10 MED ORDER — FLEET ENEMA 7-19 GM/118ML RE ENEM: 1.0000 | ENEMA | Freq: Once | RECTAL | Status: DC | PRN

## 2016-07-10 MED ORDER — ATORVASTATIN CALCIUM 40 MG PO TABS
40.0000 mg | ORAL_TABLET | Freq: Every day | ORAL | Status: DC
  Administered 2016-07-11 – 2016-07-16 (×5): 40 mg via ORAL
  Filled 2016-07-10 (×5): qty 1

## 2016-07-10 MED ORDER — LORAZEPAM 2 MG/ML IJ SOLN: 0.5000 mg | Freq: Every evening | INTRAMUSCULAR | 0 refills | Status: DC | PRN

## 2016-07-10 MED ORDER — PROCHLORPERAZINE 25 MG RE SUPP: 12.5000 mg | Freq: Four times a day (QID) | RECTAL | Status: DC | PRN

## 2016-07-10 MED ORDER — VITAMIN D (ERGOCALCIFEROL) 1.25 MG (50000 UNIT) PO CAPS
50000.0000 [IU] | ORAL_CAPSULE | ORAL | Status: DC
  Administered 2016-07-15: 50000 [IU] via ORAL
  Filled 2016-07-10: qty 1

## 2016-07-10 MED ORDER — FAMOTIDINE 10 MG PO TABS
10.0000 mg | ORAL_TABLET | Freq: Two times a day (BID) | ORAL | Status: DC
  Administered 2016-07-10 – 2016-07-16 (×10): 10 mg via ORAL
  Filled 2016-07-10 (×11): qty 1

## 2016-07-10 MED ORDER — APIXABAN 5 MG PO TABS: 5.0000 mg | ORAL_TABLET | Freq: Two times a day (BID) | ORAL | Status: DC

## 2016-07-10 MED ORDER — APIXABAN 5 MG PO TABS
5.0000 mg | ORAL_TABLET | Freq: Two times a day (BID) | ORAL | Status: DC
  Administered 2016-07-10 – 2016-07-11 (×2): 5 mg via ORAL
  Filled 2016-07-10 (×2): qty 1

## 2016-07-10 MED ORDER — POLYETHYLENE GLYCOL 3350 17 G PO PACK: 17.0000 g | PACK | Freq: Every day | ORAL | Status: DC | PRN

## 2016-07-10 MED ORDER — ALUM & MAG HYDROXIDE-SIMETH 200-200-20 MG/5ML PO SUSP: 30.0000 mL | ORAL | Status: DC | PRN

## 2016-07-10 MED ORDER — DEXAMETHASONE SODIUM PHOSPHATE 4 MG/ML IJ SOLN
4.0000 mg | Freq: Four times a day (QID) | INTRAMUSCULAR | Status: DC
  Filled 2016-07-10 (×2): qty 0.4
  Filled 2016-07-10: qty 1
  Filled 2016-07-10: qty 0.4
  Filled 2016-07-10: qty 1
  Filled 2016-07-10 (×3): qty 0.4
  Filled 2016-07-10 (×2): qty 1
  Filled 2016-07-10 (×2): qty 0.4

## 2016-07-10 NOTE — Progress Notes (Signed)
[name], [name], MD Physician Signed Physical Medicine and Rehabilitation  Consult Note Date of Service: 07/07/2016 5:56 AM  Related encounter: ED to Hosp-Admission (Discharged) from 07/03/2016 in [place] Collapse All   [] Hide copied text [] Hover for attribution information      Physical Medicine and Rehabilitation Consult Reason for Consult: Acute lower extremity weakness Referring Physician: [facility]   HPI: Luis Pierce is a 83 y.o. 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 Per chart review patient lives with spouse. Reported to be independent prior to admission. Wife not able to physically assist. His daughter/Dr. Carol Ada lives in the area. Presented 07/03/2016 with acute lower extremity weakness as well as questionable slurred speech. Troponin negative. CT of the head negative for acute changes. MRI of the head showed old basal ganglia lacunar infarct mild chronic small vessel disease. 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 MRI cervical spine showed abnormal thoracic spinal cord signal partially imaged at T2. Moderate to severe canal stenosis C4-5 and moderate C3-4. CT of the chest showed right-sided pulmonary embolism without findings for right heart strain. Venous Dopplers lower extremities showed DVT right peroneal vein. MRI thoracic spine pending with poor patient cooperation. Echocardiogram with ejection fraction 51% grade 1 diastolic dysfunction. Currently maintained on intravenous heparin.AKI from baseline and 1.00 elevated at 2.61 with renal service is consulted. Renal ultrasound showed no hydronephrosis. Felt AKI likely  due to relative hypotension maintained on IV fluids. Urology follow-up for gross hematuria monitored closely with history of artificial sphincter. Acute blood loss anemia 9.5 and monitored. Neurology continues to follow for lower extremity weakness suspect spinal cord infarction. Physical therapy evaluation completed 07/06/2016 with recommendations of physical medicine rehabilitation consult.  Daughter at bedside. States that patient has had poor appetite but picked out menu for lunch today. According to nursing and "blow out" yesterday after Senokot   Review of Systems  Constitutional: Negative for chills and fever.  Eyes:       Left eye blindness  Respiratory: Positive for shortness of breath. Negative for cough.   Cardiovascular: Positive for chest pain and leg swelling. Negative for palpitations.  Gastrointestinal: Positive for constipation. Negative for nausea and vomiting.  Genitourinary: Positive for hematuria.       Urinary incontinence  Musculoskeletal: Positive for back pain, joint pain and myalgias.  Skin: Negative for rash.  Neurological: Positive for weakness. Negative for seizures.  Psychiatric/Behavioral: Positive for memory loss.  All other systems reviewed and are negative.  Past Medical History:  Diagnosis Date  . Blind left eye    retinal detached retina  . 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 Chronic kidney disease    hx kidney stones and urinary incontinence  . Colon cancer ([place])   . Coronary artery disease   . Diverticulosis   . DJD (degenerative joint disease)   . Glaucoma   . Hyperlipemia   . Hypertension   . Lipoma of back   . Melanoma of back ([place])   . Mild mitral regurgitation   . Prostate cancer ([place])   . Retinal detachment   . Urinary incontinence   . Urinary incontinence, male, stress 07/04/2016  Past Surgical History:  Procedure Laterality Date  . APPENDECTOMY    . CARDIAC CATHETERIZATION    . CHOLECYSTECTOMY     . CORONARY ARTERY BYPASS GRAFT  1982   1 vessel  . CYSTOSCOPY  09/21/2011   Procedure: CYSTOSCOPY;  Surgeon: Reece Packer, MD;  Location: [facility];  Service: Urology;  Laterality: N/A;  . EYE SURGERY     detached retinas bilateral,bil.cataract  . TONSILLECTOMY     as child  . URINARY SPHINCTER IMPLANT  09/21/2011   Procedure: ARTIFICIAL URINARY SPHINCTER;  Surgeon: Reece Packer, MD;  Location: [facility];  Service: Urology;  Laterality: N/A;   History reviewed. No pertinent family history. Social History:  reports that he has never smoked. He has never used smokeless tobacco. He reports that he drinks about 0.6 oz of alcohol per week . He reports that he does not use drugs. Allergies: No Known Allergies       Medications Prior to Admission  Medication Sig Dispense Refill  . aspirin EC 81 MG tablet Take 81 mg by mouth daily.    Marland Kitchen atorvastatin (LIPITOR) 40 MG tablet Take 40 mg by mouth daily.     . dorzolamide-timolol (COSOPT) 22.3-6.8 MG/ML ophthalmic solution Place 1 drop into the right eye 2 (two) times daily.    Marland Kitchen latanoprost (XALATAN) 0.005 % ophthalmic solution Place 1 drop into the right eye at bedtime.    . metoprolol succinate (TOPROL-XL) 50 MG 24 hr tablet Take 50 mg by mouth daily after breakfast. Take with or immediately following a meal.      Home: Home Living Family/patient expects to be discharged to:: Private residence Living Arrangements: Spouse/significant other (spouse uses walker) Available Help at Discharge: Family, Available 24 hours/day (wife - not able to physically assist) Type of Home: House Home Layout: Two level, Laundry or work area in basement (Stair lift to basement) Alternate Therapist, sports of Steps: flight  Functional History: Prior Function Level of Independence: Independent Functional Status:  Mobility: Bed Mobility Overal bed mobility: Needs Assistance Bed Mobility: Rolling Rolling: Total assist General  bed mobility comments: Assist to bring LE into flexion and pt able to assist by reaching with UE to hold and pull on the rail. Attempted to bring trunk forward into long sitting but only able to bring slightly forward without 2 person assist.  ADL:  Cognition: Cognition Orientation Level: Oriented to place, Oriented to person Cognition Arousal/Alertness: Awake/alert (when stimulated but back to sleep quickly) Behavior During Therapy: [facility] for tasks assessed/performed  Blood pressure (!) 103/59, pulse 65, temperature 97.5 F (36.4 C), temperature source Oral, resp. rate 18, height 5\' 10"  (1.778 m), weight 84.2 kg (185 lb 11.2 oz), SpO2 100 %. Physical Exam  Vitals reviewed. HENT:  Head: Normocephalic.  Eyes:  Pupils sluggish to light  Neck: Normal range of motion. Neck supple. No thyromegaly present.  Cardiovascular:  Cardiac rate control  Respiratory: Effort normal and breath sounds normal. No respiratory distress.  GI: Soft. Bowel sounds are normal. He exhibits no distension.  Neurological: He is alert.  Alert provides name and age. Limited medical historian. Follows simple commands  Motor strength is 5/5 bilateral biceps, triceps, grip, 4 in the right deltoid, 5 in the left deltoid. Lower extremity strength 0/5 in hip flexors, knee extensors, ankle dorsi flexors, plantar flexor. There is hyperactive. Babinski in the left foot, decreased tone. Absent deep tendon reflexes bilateral lower limbs. Absent sensation to light touch and proprioception bilateral lower limbs  Lab  Results Last 24 Hours       Results for orders placed or performed during the hospital encounter of 07/03/16 (from the past 24 hour(s))  Heparin level (unfractionated)     Status: None   Collection Time: 07/07/16  3:37 AM  Result Value Ref Range   Heparin Unfractionated 0.56 0.30 - 0.70 IU/mL  Basic metabolic panel     Status: Abnormal   Collection Time: 07/07/16  3:37 AM  Result Value Ref Range    Sodium 137 135 - 145 mmol/L   Potassium 4.0 3.5 - 5.1 mmol/L   Chloride 110 101 - 111 mmol/L   CO2 17 (L) 22 - 32 mmol/L   Glucose, Bld 127 (H) 65 - 99 mg/dL   BUN 74 (H) 6 - 20 mg/dL   Creatinine, Ser 2.09 (H) 0.61 - 1.24 mg/dL   Calcium 8.2 (L) 8.9 - 10.3 mg/dL   GFR calc non Af Amer 26 (L) >60 mL/min   GFR calc Af Amer 30 (L) >60 mL/min   Anion gap 10 5 - 15      Imaging Results (Last 48 hours)  Ct Head Wo Contrast  Result Date: 07/05/2016 CLINICAL DATA:  Altered mental status and slurred speech. Agitation, new confusion, in a patient with anticoagulation. Acute lower extremity weakness concerning for cord infarct. EXAM: CT HEAD WITHOUT CONTRAST TECHNIQUE: Contiguous axial images were obtained from the base of the skull through the vertex without intravenous contrast. COMPARISON:  07/03/2016. FINDINGS: Motion degradation due to snoring. Brain: No evidence for acute infarction, hemorrhage, mass lesion, hydrocephalus, or extra-axial fluid. Generalized atrophy. Chronic microvascular ischemic change. Vascular: No signs of proximal large vessel occlusion. Advanced atherosclerotic calcification of the skull base internal carotid arteries and distal vertebral arteries. Skull: Normal. Negative for fracture or focal lesion. Sinuses/Orbits: No layering fluid in the sinuses. Unusual appearing banding devices are noted, surrounding both globes. The band on the RIGHT is partially metallic. The band on the LEFT is low attenuation, near fat density. BILATERAL cataract extraction. Other: None. Compared with 07/03/2016, similar appearance. IMPRESSION: In this patient who is anticoagulated, with altered mental status, there are no acute areas of parenchymal or subarachnoid hemorrhage. Stable appearing atrophy with extensive white matter disease. BILATERAL ocular banding procedures, as discussed above. Electronically Signed   By: Staci Righter M.D.   On: 07/05/2016 13:39   Mr Brain Wo  Contrast  Result Date: 07/06/2016 CLINICAL DATA:  Acute lower extremity weakness, LEFT back pain and falls. History of prostate cancer, melanoma. EXAM: MRI HEAD WITHOUT CONTRAST MRI CERVICAL SPINE WITHOUT CONTRAST TECHNIQUE: Multiplanar, multiecho pulse sequences of the brain and surrounding structures, and cervical spine, to include the craniocervical junction and cervicothoracic junction, were obtained without intravenous contrast. Axial T2 sequence through cervical spine not obtained, patient was unable to tolerate further imaging. MRI thoracic spine performed at this time. COMPARISON:  CT HEAD July 05, 2016 FINDINGS: MRI HEAD FINDINGS- mildly motion degraded examination. BRAIN: Focal reduced diffusion LEFT temporal lobe/ hippocampus with low ADC values. No susceptibility artifact to suggest hemorrhage. The ventricles and sulci are normal for patient's age. Scattered subcentimeter supratentorial white matter FLAIR T2 hyperintensities are less than expected for age compatible with mild chronic small vessel ischemic disease. Old bilateral basal ganglia lacunar infarcts. No suspicious parenchymal signal, masses or mass effect. No abnormal extra-axial fluid collections. VASCULAR: Normal major intracranial vascular flow voids present at skull base. SKULL AND UPPER CERVICAL SPINE: No abnormal sellar expansion. No suspicious calvarial bone marrow signal. Craniocervical  junction maintained. SINUSES/ORBITS: The mastoid air-cells and included paranasal sinuses are well-aerated. The included ocular globes and orbital contents are non-suspicious. Status post bilateral ocular lens implants and scleral banding. OTHER: None. MRI CERVICAL SPINE FINDINGS- moderate to severely motion degraded sagittal STIR and axial T2 merge ALIGNMENT: Straightened cervical lordosis.  No malalignment. VERTEBRAE/DISCS: Vertebral bodies are intact. Moderate C4-5 through C6-7 disc height loss with proportional subacute on chronic discogenic  endplate changes. Disc desiccation all cervical levels. No suspicious or acute bone marrow signal. CORD:Mildly expansile T2 bright signal at T2, incompletely evaluated. No abnormal cervical spine cord signal or syrinx. POSTERIOR FOSSA, VERTEBRAL ARTERIES, PARASPINAL TISSUES: Mild bright interstitial STIR signal upper thoracic paraspinal soft tissues. Limited overall assessment. DISC LEVELS: Limited assessment due to motion. Moderate to severe canal stenosis C4-5 due to broad-based disc bulge, moderate at C3-4. Nondiagnostic evaluation of the neural foramen. IMPRESSION: MRI HEAD: Mildly motion degraded examination. Acute small LEFT temporal lobe/ hippocampal infarct, less likely infectious etiology. Old basal ganglia lacunar infarcts and mild chronic small vessel ischemic disease. MRI CERVICAL SPINE: Abnormal thoracic spinal cord signal partially imaged at T2. Mild suspected paraspinal muscle strain thoracic spine. Recommend MRI thoracic spine with and without contrast when patient is able to tolerate further imaging. Limited four sequence MRI, motion degraded. Moderate to severe canal stenosis C4-5 suspected, moderate at C3-4. Nondiagnostic assessment of the neural foramen. Electronically Signed   By: Elon Alas M.D.   On: 07/06/2016 19:33   Mr Cervical Spine Wo Contrast  Result Date: 07/06/2016 CLINICAL DATA:  Acute lower extremity weakness, LEFT back pain and falls. History of prostate cancer, melanoma. EXAM: MRI HEAD WITHOUT CONTRAST MRI CERVICAL SPINE WITHOUT CONTRAST TECHNIQUE: Multiplanar, multiecho pulse sequences of the brain and surrounding structures, and cervical spine, to include the craniocervical junction and cervicothoracic junction, were obtained without intravenous contrast. Axial T2 sequence through cervical spine not obtained, patient was unable to tolerate further imaging. MRI thoracic spine performed at this time. COMPARISON:  CT HEAD July 05, 2016 FINDINGS: MRI HEAD FINDINGS-  mildly motion degraded examination. BRAIN: Focal reduced diffusion LEFT temporal lobe/ hippocampus with low ADC values. No susceptibility artifact to suggest hemorrhage. The ventricles and sulci are normal for patient's age. Scattered subcentimeter supratentorial white matter FLAIR T2 hyperintensities are less than expected for age compatible with mild chronic small vessel ischemic disease. Old bilateral basal ganglia lacunar infarcts. No suspicious parenchymal signal, masses or mass effect. No abnormal extra-axial fluid collections. VASCULAR: Normal major intracranial vascular flow voids present at skull base. SKULL AND UPPER CERVICAL SPINE: No abnormal sellar expansion. No suspicious calvarial bone marrow signal. Craniocervical junction maintained. SINUSES/ORBITS: The mastoid air-cells and included paranasal sinuses are well-aerated. The included ocular globes and orbital contents are non-suspicious. Status post bilateral ocular lens implants and scleral banding. OTHER: None. MRI CERVICAL SPINE FINDINGS- moderate to severely motion degraded sagittal STIR and axial T2 merge ALIGNMENT: Straightened cervical lordosis.  No malalignment. VERTEBRAE/DISCS: Vertebral bodies are intact. Moderate C4-5 through C6-7 disc height loss with proportional subacute on chronic discogenic endplate changes. Disc desiccation all cervical levels. No suspicious or acute bone marrow signal. CORD:Mildly expansile T2 bright signal at T2, incompletely evaluated. No abnormal cervical spine cord signal or syrinx. POSTERIOR FOSSA, VERTEBRAL ARTERIES, PARASPINAL TISSUES: Mild bright interstitial STIR signal upper thoracic paraspinal soft tissues. Limited overall assessment. DISC LEVELS: Limited assessment due to motion. Moderate to severe canal stenosis C4-5 due to broad-based disc bulge, moderate at C3-4. Nondiagnostic evaluation of the neural foramen. IMPRESSION: MRI  HEAD: Mildly motion degraded examination. Acute small LEFT temporal lobe/  hippocampal infarct, less likely infectious etiology. Old basal ganglia lacunar infarcts and mild chronic small vessel ischemic disease. MRI CERVICAL SPINE: Abnormal thoracic spinal cord signal partially imaged at T2. Mild suspected paraspinal muscle strain thoracic spine. Recommend MRI thoracic spine with and without contrast when patient is able to tolerate further imaging. Limited four sequence MRI, motion degraded. Moderate to severe canal stenosis C4-5 suspected, moderate at C3-4. Nondiagnostic assessment of the neural foramen. Electronically Signed   By: Elon Alas M.D.   On: 07/06/2016 19:33   US Pelvis Limited  Result Date: 07/06/2016 CLINICAL DATA:  Hematuria.  Concern for blood clot or bladder mass. EXAM: LIMITED ULTRASOUND OF PELVIS TECHNIQUE: Limited transabdominal ultrasound examination of the pelvis was performed. COMPARISON:  CT 01/26/2016 FINDINGS: Urinary bladder is minimally distended. There is some heterogeneous intraluminal debris. Hyperdensity dependently measures 1.6 x 0.8 x 2.0 cm with no definite posterior shadowing. IMPRESSION: Minimally distended bladder with some intraluminal debris. Dependent hyperdensity measuring 1.6 x 0.8 x 2.0 cm has no definite posterior shadowing. This may reflect small stones or clot. Electronically Signed   By: Jeb Levering M.D.   On: 07/06/2016 04:28     Assessment/Plan: Diagnosis: Paraparesis. 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 1. Does the need for close, 24 hr/day medical supervision in concert with the patient's rehab needs make it unreasonable for this patient to be served in a less intensive setting? Yes 2. Co-Morbidities requiring supervision/potential complications: DVT/PE on IV heparin, history of blindness, chronic left eye, subacute, right eye, chronic bladder incontinence with artificial urinary sphincter, now having hematuria requiring bladder irrigation and  deactivation of urinary sphincter. 3. Due to bladder management, bowel management, safety, skin/wound care, disease management, medication administration, pain management and patient education, does the patient require 24 hr/day rehab nursing? Yes 4. Does the patient require coordinated care of a physician, rehab nurse, PT (1-2 hrs/day, 5 days/week), OT (1-2 hrs/day, 5 days/week) and SLP (.5-1 hrs/day, 5 days/week) to address physical and functional deficits in the context of the above medical diagnosis(es)? Yes Addressing deficits in the following areas: balance, endurance, locomotion, strength, transferring, bowel/bladder control, bathing, dressing, feeding, grooming, toileting, cognition, speech, language, swallowing and psychosocial support 5. Can the patient actively participate in an intensive therapy program of at least 3 hrs of therapy per day at least 5 days per week? Yes 6. The potential for patient to make measurable gains while on inpatient rehab is fair 7. Anticipated functional outcomes upon discharge from inpatient rehab are mod assist  with PT, mod assist with OT, mod assist with SLP. 8. Estimated rehab length of stay to reach the above functional goals is: 2-3 weeks 9. Anticipated D/C setting: Other 10. Anticipated post D/C treatments: SNF rehabilitation 11. Overall Rehab/Functional Prognosis: fair  RECOMMENDATIONS: This patient's condition is appropriate for continued rehabilitative care in the following setting: CIR Patient has agreed to participate in recommended program. Potentially Note that insurance prior authorization may be required for reimbursement for recommended care.  Comment: Reduce Burden of Care  admission, no caregivers at home considering hired caregivers versus SNF post rehabilitation  Charlett Blake M.D. [facility] FAAPM&R (Sports Med, Neuromuscular Med) Diplomate Am Board of Electrodiagnostic Med  Cathlyn Parsons.,  PA-C 07/07/2016    Revision History              Routing History

## 2016-07-10 NOTE — Progress Notes (Signed)
Luis Gong, RN Rehab Admission Coordinator Signed Physical Medicine and Rehabilitation  PMR Pre-admission Date of Service: 07/09/2016 3:50 PM  Related encounter: ED to Hosp-Admission (Discharged) from 07/03/2016 in [facility]       [] Hide copied text PMR Admission Coordinator Pre-Admission Assessment  Patient: Luis Pierce is an 83 y.o., male MRN: [id number] DOB: Jun 09, 1922 Height: 5\' 10"  (177.8 cm) Weight: 86 kg (189 lb 9.6 oz)                                                                                                                                                  Insurance Information HMO:     PPO: yes     PCP:      IPA:      80/20:      OTHER: medicare advantage plan PRIMARY: [employer] Medicare      Policy#: [id number]      Subscriber: pt CM Name: Orvan July      Phone#: [phone]     Fax#: [phone] Pre-Cert#: [id number]      Employer: retired approved for 7 days. Vevelyn Royals will follow phone [phone] fax [phone] Benefits:  Phone #: [phone]     Name:  Eff. Date: 01/19/16     Deduct: none      Out of Pocket Max: $4500      Life Max: none CIR: $345 co pay per day days 1-5      SNF: no co pay days 1-20: $160 co pay per day days 21-49: no co pay days 50-100 Outpatient: $40 co pay per visit      Co-Pay: visits per medical neccesity Home Health: 100%      Co-Pay: visits per medical neccesity DME: 80%     Co-Pay: 20% Providers: in metalwork  SECONDARY: none       Medicaid Application Date:       Case Manager:  Disability Application Date:       Case Worker:   Emergency Contact Information        Contact Information    Name Relation Home Work Mobile   [place] Daughter [phone]   Theile,Catherine S Spouse [phone]       Current Medical History  Patient Admitting Diagnosis: paraparesis  History of Present Illness:   HPI: Luis Pierce a 83  y.o.right handed malewith history of CAD status post CABG, glaucoma, retinal detachment with 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  His daughter/Dr. Carol Ada lives in the area.Presented 07/03/2016 with acute lower extremity weakness as well as questionable slurred speech. Troponin negative. CT of the head negative for acute changes. MRI of the head showed old basal ganglia lacunar infarct mild chronic small vessel disease.  Acute small left temporal lobe hypocampalinfarct CT thoracic spine show advanced degenerative changes involving the thoracic spine but no fracture or bone lesion. MRI cervical spine showed abnormal thoracic spinal cord signal partially imaged at T2. Moderate to severe canal stenosis C4-5 and moderate C3-4. CT of the chest showed right-sided pulmonary embolism without findings for right heart strain. Venous Dopplers lower extremities showed DVT right peroneal vein. MRI thoracic spine pending with poor patient cooperation. Echocardiogram with ejection fraction 00% grade 1 diastolic dysfunction. Currently maintained on intravenous heparin.AKIfrom baseline and 1.00 elevated at 2.61 with renal service is consulted. Renal ultrasound showed no hydronephrosis. Felt AKIlikely due to relative hypotension maintained on IV fluids. Urology follow-up for gross hematuria monitored closely with history of artificial sphincter. Acute blood loss anemia 9.5 and monitored. Neurology continues to follow for lower extremity weakness suspect spinal cord infarction.  MRI spine showed multiple osseous lesion T3, T4, T9 and T11 with extraosseous tumor extenison at T3 level with severe central canal stenosis and mass effect on spinal cord, abnormal signal from T2-T4 mid body, soft tissue signal from T2 to above suspected mild thoracic sprain and moderate to sever canal stenosis C3-4--limited study as patient refused  contrast. MRI brain with acute small left temporal lobe/hippocampal infarct. Neurosurgery consulted and felt pt not a surgical candidate, recommend steroids to see if improves, to use back brace if he complains of pain, palliative radiation not indicated at this time.   Patient has stress urinary incontinent s/p urinary artificial sphincter placement. AUS was deactivated and foley placed in consultation with urology on 6/17. Persistent hematuria as imaging showed multiple stones and recurrent prostate cancer. He was initially started on CBI but it is on hold now. Daughter requesting foley to be discontinued with condom catheter placement.   Felt invasive and recurrent prostate cancer. Family wishes no further treatment, rehab stay to assist with bowel and bladder program, DME acquisition, caregiver education and home with hospice/palliative service at d/c.   Total: 9 NIHSS  Past Medical History  Past Medical History:  Diagnosis Date  . Blind left eye    retinal detached retina  . 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 Chronic kidney disease    hx kidney stones and urinary incontinence  . Colon cancer ([place])   . Coronary artery disease   . Diverticulosis   . DJD (degenerative joint disease)   . Glaucoma   . Hyperlipemia   . Hypertension   . Lipoma of back   . Melanoma of back ([place])   . Mild mitral regurgitation   . Prostate cancer ([place])   . Retinal detachment   . Urinary incontinence   . Urinary incontinence, male, stress 07/04/2016    Family History  family history is not on file.  Prior Rehab/Hospitalizations:  Has the patient had major surgery during 100 days prior to admission? No  Current Medications   Current Facility-Administered Medications:  .  0.9 %  sodium chloride infusion, , Intravenous, Continuous, Rosalin Hawking, MD, Last Rate: 75 mL/hr at 07/09/16 0531 .  0.9 %  sodium chloride infusion, , Intravenous, Once, Hosie Poisson, MD .   acetaminophen (TYLENOL) tablet 650 mg, 650 mg, Oral, Q6H PRN, 650 mg at 07/04/16 1944 **OR** acetaminophen (TYLENOL) suppository 650 mg, 650 mg, Rectal, Q6H PRN, Shawn Route, Sara E, PA-C .  apixaban (ELIQUIS) tablet 2.5 mg, 2.5 mg, Oral, BID, Romona Curls, RPH, 2.5 mg at 07/09/16 1019 .  atorvastatin (LIPITOR) tablet 40 mg, 40 mg, Oral, q1800, Sharene Butters  E, PA-C, 40 mg at 07/08/16 1800 .  bisacodyl (DULCOLAX) suppository 10 mg, 10 mg, Rectal, Daily PRN, Rondel Jumbo, PA-C .  calcium carbonate (TUMS - dosed in mg elemental calcium) chewable tablet 200 mg of elemental calcium, 1 tablet, Oral, Once, Hosie Poisson, MD, Stopped at 07/09/16 0215 .  cyanocobalamin ((VITAMIN B-12)) injection 1,000 mcg, 1,000 mcg, Intramuscular, Daily, Eugenie Filler, MD, 1,000 mcg at 07/09/16 1020 .  dexamethasone (DECADRON) injection 10 mg, 10 mg, Intravenous, Q6H, Rosalin Hawking, MD, 10 mg at 07/09/16 1305 .  dorzolamide-timolol (COSOPT) 22.3-6.8 MG/ML ophthalmic solution 1 drop, 1 drop, Right Eye, BID, Rondel Jumbo, PA-C, 1 drop at 07/09/16 1020 .  fentaNYL (SUBLIMAZE) injection 50 mcg, 50 mcg, Intravenous, Once, Julianne Rice, MD .  HYDROcodone-acetaminophen (NORCO/VICODIN) 5-325 MG per tablet 1-2 tablet, 1-2 tablet, Oral, Q4H PRN, Shawn Route, Sara E, PA-C .  latanoprost (XALATAN) 0.005 % ophthalmic solution 1 drop, 1 drop, Right Eye, QHS, Rondel Jumbo, PA-C, 1 drop at 07/08/16 2147 .  LORazepam (ATIVAN) injection 0.5 mg, 0.5 mg, Intravenous, QHS PRN, Eugenie Filler, MD .  MEDLINE mouth rinse, 15 mL, Mouth Rinse, q12n4p, Buriev, Arie Sabina, MD, 15 mL at 07/08/16 1600 .  ondansetron (ZOFRAN) tablet 4 mg, 4 mg, Oral, Q6H PRN **OR** ondansetron (ZOFRAN) injection 4 mg, 4 mg, Intravenous, Q6H PRN, Shawn Route, Sara E, PA-C .  senna-docusate (Senokot-S) tablet 1 tablet, 1 tablet, Oral, QHS PRN, Rondel Jumbo, PA-C, 1 tablet at 07/06/16 2115 .  sodium chloride irrigation 0.9 % 3,000 mL, 3,000 mL, Irrigation,  Continuous, Eugenie Filler, MD, 3,000 mL at 07/06/16 0346 .  Vitamin D (Ergocalciferol) (DRISDOL) capsule 50,000 Units, 50,000 Units, Oral, Q7 days, Hosie Poisson, MD, 50,000 Units at 07/08/16 1200  Patients Current Diet: Diet regular Room service appropriate? Yes; Fluid consistency: Thin  Precautions / Restrictions Precautions Precautions: Fall Precaution Comments: watch BP; BLE wraps Restrictions Weight Bearing Restrictions: No   Has the patient had 2 or more falls or a fall with injury in the past year?No  Prior Activity Level Limited Community (1-2x/wk): Independent without AD; not driving due to [place] / Equipment Home Assistive Devices/Equipment: None  Prior Device Use: Indicate devices/aids used by the patient prior to current illness, exacerbation or injury? Walker Or cane in the community. No AD in the home  Prior Functional Level Prior Function Level of Independence: Independent Comments: does not drive, wife does meal prep, has housekeeper, pt does not walk with device inside home  Self Care: Did the patient need help bathing, dressing, using the toilet or eating?  Independent  Indoor Mobility: Did the patient need assistance with walking from room to room (with or without device)? Independent  Stairs: Did the patient need assistance with internal or external stairs (with or without device)? Needed some help  Functional Cognition: Did the patient need help planning regular tasks such as shopping or remembering to take medications? Needed some help  Current Functional Level Cognition  Overall Cognitive Status: Impaired/Different from baseline Current Attention Level: Sustained Orientation Level: Oriented to person, Oriented to place Following Commands: Follows one step commands consistently (with multimodal cues due to blindness) General Comments: Pleasant.  Wants to get OOB. Having visual hallucinations, daughter reports  this is not unusual.    Extremity Assessment (includes Sensation/Coordination)  Upper Extremity Assessment: Generalized weakness  Lower Extremity Assessment: RLE deficits/detail, LLE deficits/detail RLE Deficits / Details: no active movement, PROM is WFL, flaccid B, impaired sensation RLE  Sensation: decreased light touch, decreased proprioception LLE Deficits / Details: no active movement, PROM is WFL LLE Sensation: decreased light touch, decreased proprioception    ADLs  Overall ADL's : Needs assistance/impaired Grooming: Wash/dry face, Sitting, Set up, Oral care, Minimal assistance General ADL Comments: Pt can self feed with set up and participate in grooming, dependent in bathing, dressing, toileting.    Mobility  Overal bed mobility: Needs Assistance Bed Mobility: Rolling Rolling: Max assist Sidelying to sit: Max assist, +2 for physical assistance Sit to sidelying: +2 for safety/equipment, Max assist General bed mobility comments: Patient able to use UE's on rails to initiate roll and to roll upper body.  Assist required for lower body.    Transfers  Overall transfer level: Needs assistance Transfer via Lift Equipment: [place] transfer comment: Total A to transfer to chair using maximove.   LE's wrapped with Ace bandages to assist with BP.    Ambulation / Gait / Stairs / Wheelchair Mobility  Ambulation/Gait General Gait Details: N/A    Posture / Balance Dynamic Sitting Balance Sitting balance - Comments: In chair, patient able to use UE's on armrests to pull trunk forward.  Able to maintain sitting balance with UE support and min guard assist.  Worked on pulling self forward and controlling return to seat back.  Repeated x10.  Worked on lateral weight shifting for pressure relief to both sides x2.  Patient able to return self to midline position after leaning to each side. Balance Overall balance assessment: Needs assistance Sitting-balance support:  Bilateral upper extremity supported, Feet supported Sitting balance-Leahy Scale: Poor Sitting balance - Comments: In chair, patient able to use UE's on armrests to pull trunk forward.  Able to maintain sitting balance with UE support and min guard assist.  Worked on pulling self forward and controlling return to seat back.  Repeated x10.  Worked on lateral weight shifting for pressure relief to both sides x2.  Patient able to return self to midline position after leaning to each side. Postural control: Posterior lean, Right lateral lean, Left lateral lean    Special needs/care consideration BiPAP/CPAP  N/a CPM  N/a Continuous Drip IV  N/a Dialysis  N/a Life Vest  N/a Oxygen  N/a Special Bed  N/a Trach Size  N/a Wound Vac n/a Skin rash right foot; skin tear buttocks Bowel mgmt: LBM 6/21 incontinent Bladder mgmt: artificial urinary sphincter deactivated by urologist; current indwelling catheter 6/22 but daughter requesting condom catheter Diabetic mgmt n/a Near blindness   Previous Home Environment Living Arrangements: Spouse/significant other  Lives With: Spouse Available Help at Discharge: Family, Available 24 hours/day (family and hired caregivers) Type of Home: House Home Layout: Two level, Laundry or work area in basement Alternate Therapist, sports of Steps: flight Home Access: Stairs to enter Technical brewer of Steps: 8-10 from front of house (will need to get ramp) Bathroom Shower/Tub: Tub/shower unit, Architectural technologist: Programmer, systems: No Home Care Services: No Additional Comments: level entry through the basement' 8- 10 steos though front door  Discharge Living Setting Plans for Discharge Living Setting: Patient's home, Lives with (comment) (wife) Type of Home at Discharge: House Discharge Home Layout: Laundry or work area in basement, Two level (stair lift to basement) Alternate Level Stairs-Number of Steps: flight. have stair lift  from basement Discharge Home Access: Stairs to enter [employer] of Steps: 8-10 (wil come in throught basement entry) Discharge Bathroom Shower/Tub: Tub/shower unit, Curtain Discharge Bathroom Toilet: Standard Discharge Bathroom Accessibility: No Does  the patient have any problems obtaining your medications?: No  Social/Family/Support Systems Patient Roles: Spouse, Parent Contact Information: Dr. Carol Ada, daughter Anticipated Caregiver: hired caregivers and family Anticipated Caregiver's Contact Information: see above Ability/Limitations of Caregiver: daughter, Hal Hope works, daughter from [place] intermittent Caregiver Availability: 24/7 Discharge Plan Discussed with Primary Caregiver: Yes Is Caregiver In Agreement with Plan?: Yes Does Caregiver/Family have Issues with Lodging/Transportation while Pt is in Rehab?: No   Dr. Jeremy Johann, daughter primary contact. She will hire caregivers. She is requesting hospice/palliative consult prior to d/c home to see what services he is eligible for at home. She is requesting coordination of d/c home and is realistic about expectations for rehab and d/c home. She is overwhelmed and needs assistance.  Goals/Additional Needs Patient/Family Goal for Rehab: Mod PT, OT, and SLP at wheelchair level Expected length of stay: [facility]OS 2-3 weeks Pt/Family Agrees to Admission and willing to participate: Yes Program Orientation Provided & Reviewed with Pt/Caregiver Including Roles  & Responsibilities: Yes   Decrease burden of Care through IP rehab admission: Specialzed equipment needs, Decrease number of caregivers, Bowel and bladder program and Patient/family education  Possible need for SNF placement upon discharge: not anticipated; patient is clear with family that he wants to get home and die in his home with hospice services  Patient Condition: This patient's medical and functional status has changed since the consult dated:  07/07/2016 in which the Rehabilitation Physician determined and documented that the patient's condition is appropriate for intensive rehabilitative care in an inpatient rehabilitation facility. See "History of Present Illness" (above) for medical update. Functional changes are: max to total assist. Patient's medical and functional status update has been discussed with the Rehabilitation physician and patient remains appropriate for inpatient rehabilitation. Will admit to inpatient rehab today.  Preadmission Screen Completed By:  Cleatrice Burke, 07/09/2016 3:50 PM ______________________________________________________________________   Discussed status with Dr. Letta Pate on 07/02/2016 at 0800 and received telephone approval for admission today.  Admission Coordinator:  Cleatrice Burke, time 0800 Date 07/01/2016       Cosigned by: Charlett Blake, MD at 07/09/2016 11:33 AM  Revision History

## 2016-07-10 NOTE — Discharge Summary (Signed)
Physician Discharge Summary  Luis Pierce IAX:[id number] DOB: 12-08-1922 DOA: 07/03/2016  PCP: Lajean Manes, MD  Admit date: 07/03/2016 Discharge date: 07/09/2016  Admitted From: Home Disposition:  Inpatient rehabilitation.   Recommendations for Outpatient Follow-up:  1. Follow up with Urology as recommended.   Discharge Condition: stable.   CODE STATUS:full code.  Diet recommendation: regular   Brief/Interim Summary: 83 year old gentleman, with prior h/o hypertension, CAD h/o CABG, glaucoma, retinal detachment with poor vision, h/o colon cancer, h/o prostate cancer, urinary incontinence w/p artificial sphincter, presented to ED with generalized weakness and back pain, not able to walk. Work up showed a PE and right sided DVT. Work up for back pain with MRI of the thoracic spine and cervical spine shows vertebral metastases. MRI brain shows a left hippocampal/ temporal lobe infarct. Neurology and neuro surgery consulted and recommendations given.   as per neuro surgery not a good surgical candidate, work with IV steroids to see if she improves, to use back brace if he complains of pain, palliative radiation not indicated at this time. PT consulted and recommended CIR had insurance authorization and plan to discharge him to CIR today.   Discharge Diagnoses:  Principal Problem:   Bilateral leg weakness Active Problems:   Pulmonary embolism (HCC)   Generalized weakness   Hypertension   Hyperlipidemia   Gross hematuria   Hypothermia   Dehydration   [name]   Urinary incontinence, male, stress   Pulmonary embolism on right [facility])   Thoracic back pain   Acute lower UTI   B12 deficiency   ARF (acute renal failure) (HCC)   Leg weakness   Embolic stroke (HCC)   Pressure injury of skin   DVT (deep vein thrombosis) in pregnancy ([place])   History of cancer   Paraplegia at T4 level [facility]   Spinal cord compression (HCC)   Bilateral leg weakness/ Paraplegia Possibly  from the thoracic vertebral metastases, plan to continue with IV steroids to see if he has any improvement in the weakness. Decadron 10 mg every 6 hours, was decreased to 4 mg every 6 ours, as pt became restless,fidgety and trying to get out of chair.  Plan for discharge to CIR today.   [name]:  Ativan prn as needed.    Acute blood loss anemia from gross hematuria:  Came in with a hemoglobin of 14 , and is around 7.7 to 8.2 .  Ordered a unit of prbc transfusion. Repeat H&H is 9.4.    Hypertension:  Well controlled.   Hyperlipidemia:  Resume statin.   Vitamin B12 deficiency:  Started on IM vitamin b12 injections.    Gross hematuria:  Pt has stress urinary incontinence s/p artificial urinary sphincter placement.  AUS was de activated and foley was placed in consultation with urology on 6/17.  Persistent hematuria, as imaging shows multiple stones and recurrent prostate cancer He was initially started on CBI and its on hold for now.  Urology continues to follow.  PSA Ordered .    Acute PE and DVT:  In view of the ongoing hematuria and stage 3 CKD, pt is started on eliquis 5 mg BID.    Acute renal failure on stage 3 CKD as per his creatinine clearance:  Possibly from acute illness and NSAID USE and marginal blood pressures on admission.  No hydronephrosis on US renal initially. But CT abd and pelvis on 6/21 shows some Mild bilateral hydroureteronephrosis without obstructing calculus. His creatinine at baseline today.  Left hippocampal/ left temporal lobe Stroke:  Neurology consulted and he underwent Bubble study showing a small PFO.    Invasive and recurrent Prostate Cancer:  No further treatment as per family. Possibly home with hospice/ palliative service on discharge from CIR.   Stage 1 Pressure Ulcer of the buttocks:  - wound care consulted and recommendations given.    Discharge Instructions  Discharge Instructions     Diet general    Complete by:  As directed    Discharge instructions    Complete by:  As directed    Please follow up with urology as recommended.     Allergies as of 07/12/2016   No Known Allergies     Medication List    STOP taking these medications   aspirin EC 81 MG tablet     TAKE these medications   apixaban 5 MG Tabs tablet Commonly known as:  ELIQUIS Take 1 tablet (5 mg total) by mouth 2 (two) times daily.   atorvastatin 40 MG tablet Commonly known as:  LIPITOR Take 40 mg by mouth daily.   calcium carbonate 500 MG chewable tablet Commonly known as:  TUMS - dosed in mg elemental calcium Chew 1 tablet (200 mg of elemental calcium total) by mouth once.   cyanocobalamin 1000 MCG/ML injection Commonly known as:  (VITAMIN B-12) Inject 1 mL (1,000 mcg total) into the muscle once a week.   dexamethasone 10 MG/ML injection Commonly known as:  DECADRON Inject 0.4 mLs (4 mg total) into the vein every 6 (six) hours.   dorzolamide-timolol 22.3-6.8 MG/ML ophthalmic solution Commonly known as:  COSOPT Place 1 drop into the right eye 2 (two) times daily.   latanoprost 0.005 % ophthalmic solution Commonly known as:  XALATAN Place 1 drop into the right eye at bedtime.   LORazepam 2 MG/ML injection Commonly known as:  ATIVAN Inject 0.25 mLs (0.5 mg total) into the vein at bedtime as needed for anxiety.   metoprolol succinate 50 MG 24 hr tablet Commonly known as:  TOPROL-XL Take 50 mg by mouth daily after breakfast. Take with or immediately following a meal.   senna-docusate 8.6-50 MG tablet Commonly known as:  Senokot-S Take 1 tablet by mouth at bedtime as needed for mild constipation.   Vitamin D (Ergocalciferol) 50000 units Caps capsule Commonly known as:  DRISDOL Take 1 capsule (50,000 Units total) by mouth every 7 (seven) days. Start taking on:  07/15/2016      Follow-up Information    Stoneking, Hal, MD. Schedule an appointment as soon as possible for a visit  in 1 week(s).   Specialty:  Internal Medicine Contact information: [address] [place] [address] [place] [address] [phone]          No Known Allergies  Consultations:  Neurology  Neuro surgery Dr Arnoldo Morale  Urology Dr   Wound care  Nephrology     Procedures/Studies: Ct Head Wo Contrast  Result Date: 07/05/2016 CLINICAL DATA:  Altered mental status and slurred speech. Agitation, new confusion, in a patient with anticoagulation. Acute lower extremity weakness concerning for cord infarct. EXAM: CT HEAD WITHOUT CONTRAST TECHNIQUE: Contiguous axial images were obtained from the base of the skull through the vertex without intravenous contrast. COMPARISON:  07/03/2016. FINDINGS: Motion degradation due to snoring. Brain: No evidence for acute infarction, hemorrhage, mass lesion, hydrocephalus, or extra-axial fluid. Generalized atrophy. Chronic microvascular ischemic change. Vascular: No signs of proximal large vessel occlusion. Advanced atherosclerotic calcification of the skull base internal carotid arteries  and distal vertebral arteries. Skull: Normal. Negative for fracture or focal lesion. Sinuses/Orbits: No layering fluid in the sinuses. Unusual appearing banding devices are noted, surrounding both globes. The band on the RIGHT is partially metallic. The band on the LEFT is low attenuation, near fat density. BILATERAL cataract extraction. Other: None. Compared with 07/03/2016, similar appearance. IMPRESSION: In this patient who is anticoagulated, with altered mental status, there are no acute areas of parenchymal or subarachnoid hemorrhage. Stable appearing atrophy with extensive white matter disease. BILATERAL ocular banding procedures, as discussed above. Electronically Signed   By: Staci Righter M.D.   On: 07/05/2016 13:39   Ct Head Wo Contrast  Result Date: 07/03/2016 CLINICAL DATA:  States during this episode today he felt like his speech was slurred and his bilateral legs  could not move Pt states that when woke up this morning his bilateral legs could not move and he felt his speech was slurred. EXAM: CT HEAD WITHOUT CONTRAST TECHNIQUE: Contiguous axial images were obtained from the base of the skull through the vertex without intravenous contrast. COMPARISON:  None. FINDINGS: Brain: There is moderate central and cortical atrophy. Periventricular white matter changes are consistent with small vessel disease. There is no intra or extra-axial fluid collection or mass lesion. The basilar cisterns and ventricles have a normal appearance. There is no CT evidence for acute infarction or hemorrhage. Vascular: There is atherosclerotic calcification of the carotid siphons. Skull: Normal. Negative for fracture or focal lesion. Sinuses/Orbits: No acute finding.  Bilateral scleral banding. Other: None IMPRESSION: 1.  No evidence for acute  abnormality. 2. Atrophy and small vessel disease. Electronically Signed   By: Nolon Nations M.D.   On: 07/03/2016 10:07   Ct Lumbar Spine Wo Contrast  Result Date: 07/03/2016 CLINICAL DATA:  Lower extremity weakness. EXAM: CT LUMBAR SPINE WITHOUT CONTRAST TECHNIQUE: Multidetector CT imaging of the lumbar spine was performed without intravenous contrast administration. Multiplanar CT image reconstructions were also generated. COMPARISON:  Abdominal CT 01/26/2016.  Lumbar spine MRI 01/06/2007. FINDINGS: Segmentation: Standard Alignment: Grade 1 anterolisthesis at L4-5, facet mediated. Vertebrae: Remote L4 and S1 superior endplate fracture. No acute fracture or signs of discitis. There is patchy sclerosis in the S1 body and bilateral upper ilium. Upper right ilium sclerosis correlates with chronic signal abnormality based on 2008 MRI, other areas were not definitively seen previously. No change from CT 02/15/2016. Paraspinal and other soft tissues: Ectatic celiac axis, likely from proximal stenosis. Bilateral renal sinus cysts. Colonic diverticulosis.  Postoperative bowel in the right upper quadrant. Disc levels: T12- L1: Spondylosis.  No evidence of impingement L1-L2: Spondylosis.  No evidence of impingement L2-L3: Spondylosis. Mild ligamentum flavum thickening and calcification. Subarticular recess narrowing and overall mild spinal stenosis. No evidence of foraminal impingement L3-L4: Bulging of the disc with calcification. Ligamentum flavum thickening and calcification. Degenerative facet hypertrophy. Bilateral Subarticular recess stenosis. Inferior foraminal narrowing without suspected L3 compression. L4-L5: Advanced facet arthropathy with anterolisthesis. Prominent ligamentum flavum thickening with calcification. The disc is bulging. Spinal stenosis is advanced. Disc bulging and facet spurring causes advanced right foraminal stenosis. L5-S1:Degenerative facet spurring with periarticular calcification. Negative disc. No impingement. IMPRESSION: 1. L4-5 advanced spinal and right foraminal stenosis due to disc bulging and posterior element hypertrophy. 2. L2-3 and L3-4 degenerative bilateral subarticular recess narrowing. 3. Indeterminate sclerotic foci in the bilateral ilium and S1 body, stable from January 2018. Bone scan could further evaluate in this patient with history of prostate cancer. Electronically Signed   By: Angelica Chessman  Watts M.D.   On: 07/03/2016 17:38   Mr Brain Wo Contrast  Result Date: 07/06/2016 CLINICAL DATA:  Acute lower extremity weakness, LEFT back pain and falls. History of prostate cancer, melanoma. EXAM: MRI HEAD WITHOUT CONTRAST MRI CERVICAL SPINE WITHOUT CONTRAST TECHNIQUE: Multiplanar, multiecho pulse sequences of the brain and surrounding structures, and cervical spine, to include the craniocervical junction and cervicothoracic junction, were obtained without intravenous contrast. Axial T2 sequence through cervical spine not obtained, patient was unable to tolerate further imaging. MRI thoracic spine performed at this time.  COMPARISON:  CT HEAD July 05, 2016 FINDINGS: MRI HEAD FINDINGS- mildly motion degraded examination. BRAIN: Focal reduced diffusion LEFT temporal lobe/ hippocampus with low ADC values. No susceptibility artifact to suggest hemorrhage. The ventricles and sulci are normal for patient's age. Scattered subcentimeter supratentorial white matter FLAIR T2 hyperintensities are less than expected for age compatible with mild chronic small vessel ischemic disease. Old bilateral basal ganglia lacunar infarcts. No suspicious parenchymal signal, masses or mass effect. No abnormal extra-axial fluid collections. VASCULAR: Normal major intracranial vascular flow voids present at skull base. SKULL AND UPPER CERVICAL SPINE: No abnormal sellar expansion. No suspicious calvarial bone marrow signal. Craniocervical junction maintained. SINUSES/ORBITS: The mastoid air-cells and included paranasal sinuses are well-aerated. The included ocular globes and orbital contents are non-suspicious. Status post bilateral ocular lens implants and scleral banding. OTHER: None. MRI CERVICAL SPINE FINDINGS- moderate to severely motion degraded sagittal STIR and axial T2 merge ALIGNMENT: Straightened cervical lordosis.  No malalignment. VERTEBRAE/DISCS: Vertebral bodies are intact. Moderate C4-5 through C6-7 disc height loss with proportional subacute on chronic discogenic endplate changes. Disc desiccation all cervical levels. No suspicious or acute bone marrow signal. CORD:Mildly expansile T2 bright signal at T2, incompletely evaluated. No abnormal cervical spine cord signal or syrinx. POSTERIOR FOSSA, VERTEBRAL ARTERIES, PARASPINAL TISSUES: Mild bright interstitial STIR signal upper thoracic paraspinal soft tissues. Limited overall assessment. DISC LEVELS: Limited assessment due to motion. Moderate to severe canal stenosis C4-5 due to broad-based disc bulge, moderate at C3-4. Nondiagnostic evaluation of the neural foramen. IMPRESSION: MRI HEAD: Mildly  motion degraded examination. Acute small LEFT temporal lobe/ hippocampal infarct, less likely infectious etiology. Old basal ganglia lacunar infarcts and mild chronic small vessel ischemic disease. MRI CERVICAL SPINE: Abnormal thoracic spinal cord signal partially imaged at T2. Mild suspected paraspinal muscle strain thoracic spine. Recommend MRI thoracic spine with and without contrast when patient is able to tolerate further imaging. Limited four sequence MRI, motion degraded. Moderate to severe canal stenosis C4-5 suspected, moderate at C3-4. Nondiagnostic assessment of the neural foramen. Electronically Signed   By: Elon Alas M.D.   On: 07/06/2016 19:33   Mr Cervical Spine Wo Contrast  Result Date: 07/08/2016 CLINICAL DATA:  Episode of weakness and numbness in the lower extremities bilaterally 07/03/2016. Acute/subacute left temporal lobe infarct. Abnormal signal within the upper thoracic spinal cord on the cervical spine MRI. The examination had to be discontinued prior to completion due to patient refusal for further imaging. EXAM: MRI CERVICAL AND THORACIC SPINE WITHOUT CONTRAST TECHNIQUE: Multiplanar and multiecho pulse sequences of the cervical spine, to include the craniocervical junction and cervicothoracic junction, and thoracic spine, were obtained without intravenous contrast. COMPARISON:  MRI of cervical spine 07/06/2016. FINDINGS: MRI CERVICAL SPINE FINDINGS Alignment: AP alignment is anatomic. There straightening of the normal cervical lordosis. Vertebrae: Chronic endplate marrow changes are again noted at C4-5 and on the right at C6-7. Vertebral body heights are maintained. Cord: Cervical spinal cord signal is normal.  Posterior Fossa, vertebral arteries, paraspinal tissues: The craniocervical junction is normal. The visualized intracranial contents are normal. Flow is present in the vertebral arteries bilaterally. Disc levels: Disc disease is not significantly changed. C2-3: Moderate  right foraminal narrowing is due to uncovertebral and facet spurring. C3-4: A broad-based disc osteophyte complex and facet hypertrophy contributes to moderate left greater than right foraminal narrowing and moderate right central canal stenosis. C4-5: Moderate to severe central canal stenosis is present. There is distortion of the spinal cord. The canal is narrowed to 7 mm. Severe foraminal stenosis is worse on the left. C5-6: A rightward disc osteophyte complex is present. There is partial effacement of the ventral CSF. Moderate right and mild left foraminal narrowing is present. C6-7: Uncovertebral spurring bilaterally is stable. Mild foraminal narrowing is worse on the left. C7-T1: Moderate facet hypertrophy is present bilaterally without significant stenosis. MRI THORACIC SPINE FINDINGS Alignment:  AP alignment is anatomic. Vertebrae: There is diffuse heterogeneous marrow signal. Abnormal signal is present diffusely within the T3 vertebral body involving the posterior elements bilaterally. There is abnormal signal on the right at T4 extending into the posterior elements. Epidural soft tissue extends cephalo caudad 4.3 cm from the mid body aches T2-3 the midbody of T4. This results in significant central canal stenosis. There is abnormal signal within the spinal cord at this level. Extensive T2 signal changes are present within the soft tissues posterior to the thoracic spine at this level as well. The lesion at T9 measures 3.1 x 1.9 cm on the right. A smaller superior right-sided lesion measures 9 mm. A 10.5 mm lesion is evident posteriorly and inferiorly on the left at T11. No other definite involvement of posterior elements is noted. There is also abnormal signal in the right pedicle of T4. Cord: Abnormal cord signal is present from the midbody of T2 through T4 associated with cord compression by extraosseous soft tissue mass. Paraspinal and other soft tissues: Abnormal signal is present within the posterior  paraspinal soft tissues T2-4. Disc levels: No significant focal disc protrusion is present. Facet hypertrophy contributes to mild right foraminal narrowing at T2-3. The foramina are otherwise patent. No other significant central canal stenosis is present. IMPRESSION: 1. Multiple osseous lesions within the thoracic spine, most notably at T3, T4, T9, and T11, suggesting metastatic disease. Multiple myeloma is also considered. 2. Extraosseous soft tissue mass likely reflecting tumor extension at the T3 level results in severe central canal stenosis with mass effect on the spinal cord. Abnormal cord signal a a mix extends from the midbody of T2 through the midbody of T4 . Central cord tumor is considered less likely. 3. Soft tissue signal changes posteriorly at the T2 level and extending above. This may reflect tumor or infection. 4. No significant extraosseous extension at the T9 or T10 levels. 5. Stable multilevel spondylosis in the cervical spine. 6. Heterogeneous marrow signal of the cervical spine appears be mostly discogenic. Additional sites of metastatic disease are not excluded. 7. The patient refused further imaging with contrast. Critical Value/emergent results were called by telephone at the time of interpretation on 07/08/2016 at 11:51 am to Dr. Rosalin Hawking , who verbally acknowledged these results. Electronically Signed   By: San Morelle M.D.   On: 07/08/2016 12:23   Mr Cervical Spine Wo Contrast  Result Date: 07/06/2016 CLINICAL DATA:  Acute lower extremity weakness, LEFT back pain and falls. History of prostate cancer, melanoma. EXAM: MRI HEAD WITHOUT CONTRAST MRI CERVICAL SPINE WITHOUT CONTRAST TECHNIQUE: Multiplanar,  multiecho pulse sequences of the brain and surrounding structures, and cervical spine, to include the craniocervical junction and cervicothoracic junction, were obtained without intravenous contrast. Axial T2 sequence through cervical spine not obtained, patient was unable to  tolerate further imaging. MRI thoracic spine performed at this time. COMPARISON:  CT HEAD July 05, 2016 FINDINGS: MRI HEAD FINDINGS- mildly motion degraded examination. BRAIN: Focal reduced diffusion LEFT temporal lobe/ hippocampus with low ADC values. No susceptibility artifact to suggest hemorrhage. The ventricles and sulci are normal for patient's age. Scattered subcentimeter supratentorial white matter FLAIR T2 hyperintensities are less than expected for age compatible with mild chronic small vessel ischemic disease. Old bilateral basal ganglia lacunar infarcts. No suspicious parenchymal signal, masses or mass effect. No abnormal extra-axial fluid collections. VASCULAR: Normal major intracranial vascular flow voids present at skull base. SKULL AND UPPER CERVICAL SPINE: No abnormal sellar expansion. No suspicious calvarial bone marrow signal. Craniocervical junction maintained. SINUSES/ORBITS: The mastoid air-cells and included paranasal sinuses are well-aerated. The included ocular globes and orbital contents are non-suspicious. Status post bilateral ocular lens implants and scleral banding. OTHER: None. MRI CERVICAL SPINE FINDINGS- moderate to severely motion degraded sagittal STIR and axial T2 merge ALIGNMENT: Straightened cervical lordosis.  No malalignment. VERTEBRAE/DISCS: Vertebral bodies are intact. Moderate C4-5 through C6-7 disc height loss with proportional subacute on chronic discogenic endplate changes. Disc desiccation all cervical levels. No suspicious or acute bone marrow signal. CORD:Mildly expansile T2 bright signal at T2, incompletely evaluated. No abnormal cervical spine cord signal or syrinx. POSTERIOR FOSSA, VERTEBRAL ARTERIES, PARASPINAL TISSUES: Mild bright interstitial STIR signal upper thoracic paraspinal soft tissues. Limited overall assessment. DISC LEVELS: Limited assessment due to motion. Moderate to severe canal stenosis C4-5 due to broad-based disc bulge, moderate at C3-4.  Nondiagnostic evaluation of the neural foramen. IMPRESSION: MRI HEAD: Mildly motion degraded examination. Acute small LEFT temporal lobe/ hippocampal infarct, less likely infectious etiology. Old basal ganglia lacunar infarcts and mild chronic small vessel ischemic disease. MRI CERVICAL SPINE: Abnormal thoracic spinal cord signal partially imaged at T2. Mild suspected paraspinal muscle strain thoracic spine. Recommend MRI thoracic spine with and without contrast when patient is able to tolerate further imaging. Limited four sequence MRI, motion degraded. Moderate to severe canal stenosis C4-5 suspected, moderate at C3-4. Nondiagnostic assessment of the neural foramen. Electronically Signed   By: Elon Alas M.D.   On: 07/06/2016 19:33   Mr Thoracic Spine Wo Contrast  Result Date: 07/08/2016 CLINICAL DATA:  Episode of weakness and numbness in the lower extremities bilaterally 07/03/2016. Acute/subacute left temporal lobe infarct. Abnormal signal within the upper thoracic spinal cord on the cervical spine MRI. The examination had to be discontinued prior to completion due to patient refusal for further imaging. EXAM: MRI CERVICAL AND THORACIC SPINE WITHOUT CONTRAST TECHNIQUE: Multiplanar and multiecho pulse sequences of the cervical spine, to include the craniocervical junction and cervicothoracic junction, and thoracic spine, were obtained without intravenous contrast. COMPARISON:  MRI of cervical spine 07/06/2016. FINDINGS: MRI CERVICAL SPINE FINDINGS Alignment: AP alignment is anatomic. There straightening of the normal cervical lordosis. Vertebrae: Chronic endplate marrow changes are again noted at C4-5 and on the right at C6-7. Vertebral body heights are maintained. Cord: Cervical spinal cord signal is normal. Posterior Fossa, vertebral arteries, paraspinal tissues: The craniocervical junction is normal. The visualized intracranial contents are normal. Flow is present in the vertebral arteries  bilaterally. Disc levels: Disc disease is not significantly changed. C2-3: Moderate right foraminal narrowing is due to uncovertebral and facet spurring.  C3-4: A broad-based disc osteophyte complex and facet hypertrophy contributes to moderate left greater than right foraminal narrowing and moderate right central canal stenosis. C4-5: Moderate to severe central canal stenosis is present. There is distortion of the spinal cord. The canal is narrowed to 7 mm. Severe foraminal stenosis is worse on the left. C5-6: A rightward disc osteophyte complex is present. There is partial effacement of the ventral CSF. Moderate right and mild left foraminal narrowing is present. C6-7: Uncovertebral spurring bilaterally is stable. Mild foraminal narrowing is worse on the left. C7-T1: Moderate facet hypertrophy is present bilaterally without significant stenosis. MRI THORACIC SPINE FINDINGS Alignment:  AP alignment is anatomic. Vertebrae: There is diffuse heterogeneous marrow signal. Abnormal signal is present diffusely within the T3 vertebral body involving the posterior elements bilaterally. There is abnormal signal on the right at T4 extending into the posterior elements. Epidural soft tissue extends cephalo caudad 4.3 cm from the mid body aches T2-3 the midbody of T4. This results in significant central canal stenosis. There is abnormal signal within the spinal cord at this level. Extensive T2 signal changes are present within the soft tissues posterior to the thoracic spine at this level as well. The lesion at T9 measures 3.1 x 1.9 cm on the right. A smaller superior right-sided lesion measures 9 mm. A 10.5 mm lesion is evident posteriorly and inferiorly on the left at T11. No other definite involvement of posterior elements is noted. There is also abnormal signal in the right pedicle of T4. Cord: Abnormal cord signal is present from the midbody of T2 through T4 associated with cord compression by extraosseous soft tissue mass.  Paraspinal and other soft tissues: Abnormal signal is present within the posterior paraspinal soft tissues T2-4. Disc levels: No significant focal disc protrusion is present. Facet hypertrophy contributes to mild right foraminal narrowing at T2-3. The foramina are otherwise patent. No other significant central canal stenosis is present. IMPRESSION: 1. Multiple osseous lesions within the thoracic spine, most notably at T3, T4, T9, and T11, suggesting metastatic disease. Multiple myeloma is also considered. 2. Extraosseous soft tissue mass likely reflecting tumor extension at the T3 level results in severe central canal stenosis with mass effect on the spinal cord. Abnormal cord signal a a mix extends from the midbody of T2 through the midbody of T4 . Central cord tumor is considered less likely. 3. Soft tissue signal changes posteriorly at the T2 level and extending above. This may reflect tumor or infection. 4. No significant extraosseous extension at the T9 or T10 levels. 5. Stable multilevel spondylosis in the cervical spine. 6. Heterogeneous marrow signal of the cervical spine appears be mostly discogenic. Additional sites of metastatic disease are not excluded. 7. The patient refused further imaging with contrast. Critical Value/emergent results were called by telephone at the time of interpretation on 07/08/2016 at 11:51 am to Dr. Rosalin Hawking , who verbally acknowledged these results. Electronically Signed   By: San Morelle M.D.   On: 07/08/2016 12:23   US Pelvis Limited  Result Date: 07/06/2016 CLINICAL DATA:  Hematuria.  Concern for blood clot or bladder mass. EXAM: LIMITED ULTRASOUND OF PELVIS TECHNIQUE: Limited transabdominal ultrasound examination of the pelvis was performed. COMPARISON:  CT 01/26/2016 FINDINGS: Urinary bladder is minimally distended. There is some heterogeneous intraluminal debris. Hyperdensity dependently measures 1.6 x 0.8 x 2.0 cm with no definite posterior shadowing.  IMPRESSION: Minimally distended bladder with some intraluminal debris. Dependent hyperdensity measuring 1.6 x 0.8 x 2.0 cm has no  definite posterior shadowing. This may reflect small stones or clot. Electronically Signed   By: Jeb Levering M.D.   On: 07/06/2016 04:28   Ct Abdomen Pelvis W Contrast  Result Date: 07/08/2016 CLINICAL DATA:  Hematuria.  History of prostate cancer. EXAM: CT ABDOMEN AND PELVIS WITH CONTRAST TECHNIQUE: Multidetector CT imaging of the abdomen and pelvis was performed using the standard protocol following bolus administration of intravenous contrast. CONTRAST:  17m ISOVUE-300 IOPAMIDOL (ISOVUE-300) INJECTION 61% COMPARISON:  CT scan of January 26, 2016. FINDINGS: Lower chest: Mild bibasilar posterior subsegmental atelectasis is noted. Hepatobiliary: No focal liver abnormality is seen. Status post cholecystectomy. No biliary dilatation. Pancreas: Unremarkable. No pancreatic ductal dilatation or surrounding inflammatory changes. Spleen: Normal in size without focal abnormality. Adrenals/Urinary Tract: Adrenal glands are unremarkable. Bilateral parapelvic cysts are noted. Nonobstructive 1 cm calculus is seen in middle pole infundibulum. Mild bilateral hydroureteronephrosis is noted without obstructing calculus. Urinary bladder is decompressed secondary to Foley catheter. Several bladder calculi are noted. 4.5 x 2.9 cm irregular lobulated mass appears to be involving the posterior wall of the urinary bladder which is slightly enlarged compared to prior exam. This is most consistent with invasive and recurrent prostate cancer. Cystoscopy is recommended for further evaluation. Stomach/Bowel: Stomach is unremarkable. Status post right hemicolectomy, with small bowel dilatation extending to the surgical anastomosis. This is concerning for at least partial small bowel obstruction. Vascular/Lymphatic: Aortic atherosclerosis. No enlarged abdominal or pelvic lymph nodes. Reproductive: As noted  above, 4.5 x 2.9 cm lobulated mass is seen in posterior inferior portion of urinary bladder concerning for invasive and recurrent prostate cancer. Other: Penile reservoir is noted in left lower quadrant. No hernia or abnormal fluid collection is noted. Musculoskeletal: 2 sclerotic lesions are noted in the left iliac wing which are increased compared to prior exam and consistent with metastatic disease. Stable sclerotic density seen in right iliac wing concerning for metastatic disease. New metastatic focus is noted in inferior portion of left ischial tuberosity. IMPRESSION: Mild bibasilar subsegmental atelectasis. Nonobstructive 1 cm right renal calculus. Mild bilateral hydroureteronephrosis is noted without obstructing calculus. Urinary bladder is decompressed secondary to Foley catheter. Several bladder calculi are noted. 4.5 x 2.9 cm irregular lobulated mass appears to be involving the posterior and inferior wall of urinary bladder which is slightly enlarged compared to prior exam. This is concerning for invasive recurrent prostate cancer, and cystoscopy is recommended for further evaluation. Status post right hemicolectomy. Moderate small bowel dilatation is noted which extends to the surgical anastomosis in the right side of the abdomen, concerning for partial small bowel obstruction. Aortic atherosclerosis. Osseous metastatic lesions are noted in the left side of the pelvis which are increased compared to prior exam. Electronically Signed   By: JMarijo Conception M.D.   On: 07/08/2016 15:55   [place]al  Result Date: 07/04/2016 CLINICAL DATA:  83year old male with acute renal failure. EXAM: RENAL / URINARY TRACT ULTRASOUND COMPLETE COMPARISON:  Abdominal CT dated 01/26/2016 FINDINGS: Right Kidney: Length: 10.1 cm. There is mild diffuse parenchymal atrophy and cortical thinning. Mild increased echogenicity. No hydronephrosis or echogenic stone. Left Kidney: Length: 10.8 cm. Mild diffuse parenchymal atrophy and  cortical thinning. Mild increased echogenicity. No hydronephrosis or echogenic stone. Bladder: The urinary bladder is decompressed around a Foley catheter. Incidental note of an echogenic liver, likely related to fatty infiltration. IMPRESSION: 1. Mild renal atrophy, likely age-related. No hydronephrosis or echogenic stone. 2. Fatty liver. Electronically Signed   By: ALaren EvertsD.  On: 07/04/2016 23:36   Ct T-spine No Charge  Result Date: 07/03/2016 CLINICAL DATA:  Back pain for 2 days. EXAM: CT THORACIC SPINE WITHOUT CONTRAST TECHNIQUE: Multidetector CT images of the thoracic were obtained using the standard protocol without intravenous contrast. COMPARISON:  None. FINDINGS: The thoracic vertebral bodies are normally aligned. There are advanced degenerative changes with anterior bridging osteophytes and probable changes of DISH. No acute fracture. No bone lesion. No spinal canal compromise. As noted on today's chest CT there are right-sided pulmonary emboli. Bibasilar atelectasis. IMPRESSION: Advanced degenerative changes involving the thoracic spine but no fracture or bone lesion. Right-sided pulmonary emboli and bibasilar atelectasis. Electronically Signed   By: Marijo Sanes M.D.   On: 07/03/2016 14:52   Dg Chest Port 1 View  Result Date: 07/03/2016 CLINICAL DATA:  Chest pain EXAM: PORTABLE CHEST 1 VIEW COMPARISON:  09/13/2011 FINDINGS: Previous median sternotomy CABG procedure. No pleural effusion or edema. No airspace opacities. IMPRESSION: 1. No acute cardiopulmonary abnormalities. Electronically Signed   By: Kerby Moors M.D.   On: 07/03/2016 09:00   Ct Angio Chest Aorta W And/or Wo Contrast  Result Date: 07/03/2016 CLINICAL DATA:  Back and chest pain. EXAM: CT ANGIOGRAPHY CHEST WITH CONTRAST TECHNIQUE: Multidetector CT imaging of the chest was performed using the standard protocol during bolus administration of intravenous contrast. Multiplanar CT image reconstructions and MIPs were  obtained to evaluate the vascular anatomy. CONTRAST:  100 cc Isovue 370 COMPARISON:  None. FINDINGS: Cardiovascular: The heart is upper limits of normal in size for age. No pericardial effusion. There is tortuosity, ectasia and mild atherosclerotic calcifications involving the thoracic aorta. No focal aneurysm or dissection. Mild fusiform enlargement of the ascending aorta measuring a maximum of 39.5 mm on image number 57. The pulmonary arterial tree is fairly well opacified. There is moderate right-sided filling defects consistent with pulmonary embolism. No findings to suggest right heart strain. The RV LV ratio is normal. Coronary artery calcifications and surgical changes from bypass surgery. Mediastinum/Nodes: Mediastinal and hilar lymph nodes are noted. Right hilar node on image number 50 measures 25.5 x 15 mm. The esophagus is grossly. Lungs/Pleura: Moderate patchy bilateral lower lobe atelectasis without definite infiltrate, effusion or edema. No worrisome pulmonary lesions. Upper Abdomen: No significant upper abdominal findings. The abdominal aorta is normal in caliber. No dissection. Scattered atherosclerotic calcifications. Moderate tortuosity distally. Musculoskeletal: No significant bony findings. Advanced degenerative changes involving the thoracic spine but no bone lesion or fracture. Sternal wires related to prior bypass surgery. The Review of the MIP images confirms the above findings. IMPRESSION: 1. Right-sided pulmonary embolism without findings for right heart strain. 2. Tortuosity, ectasia and atherosclerotic calcifications involving the thoracic aorta but no dissection. 3. Three-vessel coronary artery calcifications and surgical changes from coronary artery bypass surgery. 4. Bibasilar atelectasis but no definite infiltrates, edema or effusions. Aortic Atherosclerosis (ICD10-I70.0). Electronically Signed   By: Marijo Sanes M.D.   On: 07/03/2016 14:51       Subjective: Sleeping  comfortably.   Discharge Exam: Vitals:   07/09/16 2026 07/11/2016 0450  BP: (!) 146/67 (!) 145/62  Pulse: 80 82  Resp: 18 18  Temp:  98.6 F (37 C)   Vitals:   07/09/16 1402 07/09/16 1716 07/09/16 2026 06/25/2016 0450  BP: 130/68 128/64 (!) 146/67 (!) 145/62  Pulse: 80 74 80 82  Resp: _0 Temp: 97.8 F (36.6 C) 98.1 F (36.7 C)  98.6 F (37 C)  TempSrc: Oral Oral  Oral  SpO2: 97% 96% 96% 98%  Weight:    85.4 kg (188 lb 3.2 oz)  Height:        General: Pt is not in distress.  Cardiovascular: RRR, S1/S2 +, no rubs, no gallops Respiratory: CTA bilaterally, no wheezing, no rhonchi Abdominal: Soft, NT, ND, bowel sounds + Extremities: no edema, no cyanosis    The results of significant diagnostics from this hospitalization (including imaging, microbiology, ancillary and laboratory) are listed below for reference.     Microbiology: Recent Results (from the past 240 hour(s))  Urine culture     Status: Abnormal   Collection Time: 07/03/16  3:16 PM  Result Value Ref Range Status   Specimen Description URINE, RANDOM  Final   Special Requests NONE  Final   Culture <10,000 COLONIES/mL INSIGNIFICANT GROWTH (A)  Final   Report Status 07/04/2016 FINAL  Final  Culture, blood (Routine X 2) w Reflex to ID Panel     Status: None   Collection Time: 07/03/16  6:08 PM  Result Value Ref Range Status   Specimen Description BLOOD LEFT HAND  Final   Special Requests IN PEDIATRIC BOTTLE Blood Culture adequate volume  Final   Culture NO GROWTH 5 DAYS  Final   Report Status 07/08/2016 FINAL  Final  Culture, blood (Routine X 2) w Reflex to ID Panel     Status: None   Collection Time: 07/03/16  6:13 PM  Result Value Ref Range Status   Specimen Description BLOOD RIGHT ANTECUBITAL  Final   Special Requests IN PEDIATRIC BOTTLE Blood Culture adequate volume  Final   Culture NO GROWTH 5 DAYS  Final   Report Status 07/08/2016 FINAL  Final     Labs: BNP (last 3 results) No results for  input(s): BNP in the last 8760 hours. Basic Metabolic Panel:  Recent Labs Lab 07/06/16 0321 07/07/16 0337 07/08/16 0334 07/09/16 0228 07/07/2016 0155  NA 137 137 138 137 139  K 4.3 4.0 3.9 4.0 4.2  CL 110 110 112* 111 113*  CO2 19* 17* 21* 20* 20*  GLUCOSE 110* 127* 104* 121* 150*  BUN 66* 74* 59* 50* 48*  CREATININE 2.61* 2.09* 1.17 1.36* 0.91  CALCIUM 7.9* 8.2* 8.2* 8.2* 8.5*   Liver Function Tests:  Recent Labs Lab 07/03/16 1803 07/04/16 0952  AST  --  27  ALT  --  16*  ALKPHOS  --  187*  BILITOT 1.4* 0.8  PROT  --  5.4*  ALBUMIN  --  2.8*   No results for input(s): LIPASE, AMYLASE in the last 168 hours. No results for input(s): AMMONIA in the last 168 hours. CBC:  Recent Labs Lab 07/04/16 0958 07/05/16 0528 07/06/16 0321 07/07/16 1308 07/08/16 0334 07/08/16 2036 07/09/16 0228 06/22/2016 0856  WBC 14.1* 15.4* 14.3*  --  12.7*  --  13.6* 19.6*  NEUTROABS 11.7*  --   --   --   --   --   --   --   HGB 13.8 11.5* 9.5* 8.8* 8.2* 7.7* 8.2* 9.4*  HCT 40.2 34.5* 28.9* 27.1* 25.4* 23.3* 25.1* 28.8*  MCV 88.9 90.8 89.5  --  91.7  --  90.3 87.8  PLT 123* 159 178  --  224  --  249 303   Cardiac Enzymes: No results for input(s): CKTOTAL, CKMB, CKMBINDEX, TROPONINI in the last 168 hours. BNP: Invalid input(s): POCBNP CBG:  Recent Labs Lab 07/03/16 1516  GLUCAP 118*   D-Dimer No results for input(s): DDIMER in the  last 72 hours. Hgb A1c No results for input(s): HGBA1C in the last 72 hours. Lipid Profile  Recent Labs  07/08/16 0334  CHOL 102  HDL 24*  LDLCALC 52  TRIG 128  CHOLHDL 4.3   Thyroid function studies No results for input(s): TSH, T4TOTAL, T3FREE, THYROIDAB in the last 72 hours.  Invalid input(s): FREET3 Anemia work up No results for input(s): VITAMINB12, FOLATE, FERRITIN, TIBC, IRON, RETICCTPCT in the last 72 hours. Urinalysis    Component Value Date/Time   COLORURINE RED (A) 07/03/2016 1516   APPEARANCEUR TURBID (A) 07/03/2016 1516    LABSPEC  07/03/2016 1516    TEST NOT REPORTED DUE TO COLOR INTERFERENCE OF URINE PIGMENT   PHURINE  07/03/2016 1516    TEST NOT REPORTED DUE TO COLOR INTERFERENCE OF URINE PIGMENT   GLUCOSEU (A) 07/03/2016 1516    TEST NOT REPORTED DUE TO COLOR INTERFERENCE OF URINE PIGMENT   HGBUR (A) 07/03/2016 1516    TEST NOT REPORTED DUE TO COLOR INTERFERENCE OF URINE PIGMENT   BILIRUBINUR (A) 07/03/2016 1516    TEST NOT REPORTED DUE TO COLOR INTERFERENCE OF URINE PIGMENT   KETONESUR (A) 07/03/2016 1516    TEST NOT REPORTED DUE TO COLOR INTERFERENCE OF URINE PIGMENT   PROTEINUR (A) 07/03/2016 1516    TEST NOT REPORTED DUE TO COLOR INTERFERENCE OF URINE PIGMENT   NITRITE (A) 07/03/2016 1516    TEST NOT REPORTED DUE TO COLOR INTERFERENCE OF URINE PIGMENT   LEUKOCYTESUR (A) 07/03/2016 1516    TEST NOT REPORTED DUE TO COLOR INTERFERENCE OF URINE PIGMENT   Sepsis Labs Invalid input(s): PROCALCITONIN,  WBC,  LACTICIDVEN Microbiology Recent Results (from the past 240 hour(s))  Urine culture     Status: Abnormal   Collection Time: 07/03/16  3:16 PM  Result Value Ref Range Status   Specimen Description URINE, RANDOM  Final   Special Requests NONE  Final   Culture <10,000 COLONIES/mL INSIGNIFICANT GROWTH (A)  Final   Report Status 07/04/2016 FINAL  Final  Culture, blood (Routine X 2) w Reflex to ID Panel     Status: None   Collection Time: 07/03/16  6:08 PM  Result Value Ref Range Status   Specimen Description BLOOD LEFT HAND  Final   Special Requests IN PEDIATRIC BOTTLE Blood Culture adequate volume  Final   Culture NO GROWTH 5 DAYS  Final   Report Status 07/08/2016 FINAL  Final  Culture, blood (Routine X 2) w Reflex to ID Panel     Status: None   Collection Time: 07/03/16  6:13 PM  Result Value Ref Range Status   Specimen Description BLOOD RIGHT ANTECUBITAL  Final   Special Requests IN PEDIATRIC BOTTLE Blood Culture adequate volume  Final   Culture NO GROWTH 5 DAYS  Final   Report Status  07/08/2016 FINAL  Final     Time coordinating discharge: Over 30 minutes  SIGNED:   Hosie Poisson, MD  [facility] 06/26/2016, 1:03 PM Pager   If 7PM-7AM, please contact night-coverage [email or web address] Password [email or web address]

## 2016-07-10 NOTE — Progress Notes (Signed)
Orthopedic Tech Progress Note Patient Details:  Luis Pierce 05-Apr-1922 [id number]  Patient ID: Luis Pierce, male   DOB: [birth year], 83 y.o.   MRN: [id number]   Luis Pierce 07/07/2016, 3:27 PM Called in advanced brace order; spoke with answering service

## 2016-07-10 NOTE — H&P (Signed)
Physical Medicine and Rehabilitation Admission H&P        Chief Complaint  Patient presents with  . 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       HPI: Luis Pierce is a 83 year old male with history of HTN, CAD s/p CABG, legally blind, prostate cancer, melanoma, colon cancer who was admitted on 07/03/16 with BLE weakness with numbness, inability to walk, slurred speech as well as back and chest pain. He was found to have UTI with hematuria and started on rocephin as well as CBI per [name]. CTA chest showed right PE and IV heparin started.   He has had issues with confusion as well as worsening of renal status. [name] consulted and felt that this was due to ATN from hypotension and NSAIDs use. Renal ultrasound with cortical thinning and no hydronephrosis. MRI brain with acute small left temporal lobe/hippocampal infarct. [name] felt that stroke embolic due to paradoxical emboli from RLE DVT or hypercoagulable state. 2 D echo with EF 60- 65% and no SOE. Heparin changed to low dose eliquis due to ongoing hematuria. BC/UCS negative and rocephin discontinued 6/19.    CT abdomen pelvis done due to ongoing hematuria and showed osseous metastatic lesions left pelvis-increased from prior exam,  1 cm non obstructive renal calculi with 4.5 X 2.9 cm irregular bladder mass (slightly enlarged) concerning for invasive recurrent prostate cancer as well as moderate small bowel distension concerning for partial SBO.   MRI spine showed multiple osseous lesion T3, T4, T9 and T11 with extraosseous tumor extenison at T3 level with severe central canal stenosis and mass effect on spinal cord, abnormal signal from T2-T4 mid body, soft tissue signal from T2 to above suspected mild thoracic sprain and moderate to sever canal stenosis C3-4--limited study as patient refused contrast. [name] consulted and recommended trial of IV decadron for a few days--bracing prn for comfort. CBI discontinued as bleeding  likely due to bladder lesion and has been transfused with one unit PRBC 6/22 for drop in H/H. Therapy ongoing with BLE wrapped to help support BP. CIR recommended for follow up therapy.    Discussed with Pt and Daughter (POA) wishes to be DNR   Review of Systems  HENT: Positive for hearing loss.   Eyes: Positive for blurred vision (blind in left eye and has 10% vision in the right).  Respiratory: Negative for cough and shortness of breath.   Cardiovascular: Negative for chest pain and palpitations.  Gastrointestinal: Negative for heartburn and nausea.       Has been incontinent of stool  Genitourinary: Negative for dysuria.  Musculoskeletal: Negative for back pain and myalgias.  Skin: Negative for itching and rash.  Psychiatric/Behavioral: Positive for hallucinations and memory loss.            Past Medical History:  Diagnosis Date  . Blind left eye      retinal detached retina  . 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 Chronic kidney disease      hx kidney stones and urinary incontinence  . Colon cancer ([place])    . Coronary artery disease    . Diverticulosis    . DJD (degenerative joint disease)    . Glaucoma    . Hyperlipemia    . Hypertension    . Lipoma of back    . Melanoma of back ([place])    . Mild mitral regurgitation    . Prostate cancer ([place])    . Retinal detachment    .  Urinary incontinence    . Urinary incontinence, male, stress 07/04/2016           Past Surgical History:  Procedure Laterality Date  . APPENDECTOMY      . CARDIAC CATHETERIZATION      . CHOLECYSTECTOMY      . CORONARY ARTERY BYPASS GRAFT   1982    1 vessel  . CYSTOSCOPY   09/21/2011    Procedure: CYSTOSCOPY;  Surgeon: Reece Packer, MD;  Location: [facility];  Service: Urology;  Laterality: N/A;  . EYE SURGERY        detached retinas bilateral,bil.cataract  . TONSILLECTOMY        as child  . URINARY SPHINCTER IMPLANT   09/21/2011    Procedure: ARTIFICIAL URINARY SPHINCTER;  Surgeon: Reece Packer, MD;  Location: [facility];  Service: Urology;  Laterality: N/A;           Family History  Problem Relation Age of Onset  . Melanoma Mother    . Heart attack Father          Social History:  Married. Independent PTA. Per reports that he has never smoked. He has never used smokeless tobacco. He reports that he drinks about 0.6 oz of alcohol per week . He reports that he does not use drugs.      Allergies: No Known Allergies            Medications Prior to Admission  Medication Sig Dispense Refill  . aspirin EC 81 MG tablet Take 81 mg by mouth daily.      Marland Kitchen atorvastatin (LIPITOR) 40 MG tablet Take 40 mg by mouth daily.       . dorzolamide-timolol (COSOPT) 22.3-6.8 MG/ML ophthalmic solution Place 1 drop into the right eye 2 (two) times daily.      Marland Kitchen latanoprost (XALATAN) 0.005 % ophthalmic solution Place 1 drop into the right eye at bedtime.      . metoprolol succinate (TOPROL-XL) 50 MG 24 hr tablet Take 50 mg by mouth daily after breakfast. Take with or immediately following a meal.          Home: Home Living Family/patient expects to be discharged to:: Private residence Living Arrangements: Spouse/significant other Available Help at Discharge: Family, Available 24 hours/day (family and hired caregivers) Type of Home: House Home Access: Stairs to enter [employer] of Steps: 8-10 from front of house (will need to get ramp) Home Layout: Two level, Laundry or work area in basement Alternate Therapist, sports of Steps: flight Bathroom Shower/Tub: Public librarian, Architectural technologist: Standard Bathroom Accessibility: No Additional Comments: level entry through the basement' 8- 10 steos though front door  Lives With: Spouse   Functional History: Prior Function Level of Independence: Independent Comments: does not drive, wife does meal prep, has housekeeper, pt does not walk with device inside home   Functional Status:  Mobility: Bed Mobility Overal  bed mobility: Needs Assistance Bed Mobility: Rolling Rolling: Max assist Sidelying to sit: Max assist, +2 for physical assistance Sit to sidelying: +2 for safety/equipment, Max assist General bed mobility comments: Patient able to use UE's on rails to initiate roll and to roll upper body.  Assist required for lower body. Transfers Overall transfer level: Needs assistance Transfer via Lift Equipment: [place] transfer comment: Total A to transfer to chair using maximove.   LE's wrapped with Ace bandages to assist with BP. Ambulation/Gait General Gait Details: N/A   ADL: ADL Overall ADL's : Needs assistance/impaired  Grooming: Wash/dry face, Sitting, Set up, Oral care, Minimal assistance General ADL Comments: Pt can self feed with set up and participate in grooming, dependent in bathing, dressing, toileting.   Cognition: Cognition Overall Cognitive Status: Impaired/Different from baseline Orientation Level: Oriented to person, Oriented to place Cognition Arousal/Alertness: Awake/alert Behavior During Therapy: WFL for tasks assessed/performed Overall Cognitive Status: Impaired/Different from baseline Area of Impairment: Problem solving, Awareness, Following commands Orientation Level: Time (month) Current Attention Level: Sustained Following Commands: Follows one step commands consistently (with multimodal cues due to blindness) Problem Solving: Slow processing, Requires verbal cues, Requires tactile cues General Comments: Pleasant.  Wants to get OOB. Having visual hallucinations, daughter reports this is not unusual.   Physical Exam: Blood pressure 128/64, pulse 74, temperature 98.1 F (36.7 C), temperature source Oral, resp. rate 20, height _0  (1.778 m), weight 86 kg (189 lb 9.6 oz), SpO2 96 %. Physical Exam  Nursing note and vitals reviewed. Constitutional: He appears well-developed and well-nourished.  HENT:  Head: Normocephalic and atraumatic.  Mouth/Throat:  Oropharynx is clear and moist.  Eyes: Conjunctivae are normal. Pupils are equal, round, and reactive to light.  Neck: Normal range of motion. Neck supple.  Cardiovascular: Normal rate and regular rhythm.   Respiratory: Effort normal and breath sounds normal. No stridor.  GI: Soft. He exhibits distension. Bowel sounds are decreased.  Genitourinary:  Genitourinary Comments: Foley in place with bloody urine in tubing and bag. Incontinent of liquid stool.   Neurological: He is alert.  HOH. Dysarthric speech. Confused and rambling. Able to follow simple one step commands.   Skin: Skin is warm and dry.  Psychiatric: His speech is slurred. He is actively hallucinating. Cognition and memory are impaired. He expresses impulsivity.   motor 4/5 Bilateral delt bi tri grip, 0/5 bilateral HF, KE, ADF Absent LT and Proprioception in BLE   Lab Results Last 48 Hours        Results for orders placed or performed during the hospital encounter of 07/03/16 (from the past 48 hour(s))  CBC     Status: Abnormal    Collection Time: 07/08/16  3:34 AM  Result Value Ref Range    WBC 12.7 (H) 4.0 - 10.5 K/uL    RBC 2.77 (L) 4.22 - 5.81 MIL/uL    Hemoglobin 8.2 (L) 13.0 - 17.0 g/dL    HCT 25.4 (L) 39.0 - 52.0 %    MCV 91.7 78.0 - 100.0 fL    MCH 29.6 26.0 - 34.0 pg    MCHC 32.3 30.0 - 36.0 g/dL    RDW 14.5 11.5 - 15.5 %    Platelets 224 150 - 400 K/uL  Heparin level (unfractionated)     Status: None    Collection Time: 07/08/16  3:34 AM  Result Value Ref Range    Heparin Unfractionated 0.31 0.30 - 0.70 IU/mL      Comment:        IF HEPARIN RESULTS ARE BELOW EXPECTED VALUES, AND PATIENT DOSAGE HAS BEEN CONFIRMED, SUGGEST FOLLOW UP TESTING OF ANTITHROMBIN III LEVELS.    Basic metabolic panel     Status: Abnormal    Collection Time: 07/08/16  3:34 AM  Result Value Ref Range    Sodium 138 135 - 145 mmol/L    Potassium 3.9 3.5 - 5.1 mmol/L    Chloride 112 (H) 101 - 111 mmol/L    CO2 21 (L) 22 - 32 mmol/L     Glucose, Bld 104 (H) 65 - 99 mg/dL  BUN 59 (H) 6 - 20 mg/dL    Creatinine, Ser 1.17 0.61 - 1.24 mg/dL    Calcium 8.2 (L) 8.9 - 10.3 mg/dL    GFR calc non Af Amer 53 (L) >60 mL/min    GFR calc Af Amer >60 >60 mL/min      Comment: (NOTE) The eGFR has been calculated using the CKD EPI equation. This calculation has not been validated in all clinical situations. eGFR's persistently <60 mL/min signify possible Chronic Kidney Disease.      Anion gap 5 5 - 15  Lipid panel     Status: Abnormal    Collection Time: 07/08/16  3:34 AM  Result Value Ref Range    Cholesterol 102 0 - 200 mg/dL    Triglycerides 128 <150 mg/dL    HDL 24 (L) >40 mg/dL    Total CHOL/HDL Ratio 4.3 RATIO    VLDL 26 0 - 40 mg/dL    LDL Cholesterol 52 0 - 99 mg/dL      Comment:        Total Cholesterol/HDL:CHD Risk Coronary Heart Disease Risk Table                     Men   Women  1/2 Average Risk   3.4   3.3  Average Risk       5.0   4.4  2 X Average Risk   9.6   7.1  3 X Average Risk  23.4   11.0        Use the calculated Patient Ratio above and the CHD Risk Table to determine the patient's CHD Risk.        ATP III CLASSIFICATION (LDL):  <100     mg/dL   Optimal  100-129  mg/dL   Near or Above                    Optimal  130-159  mg/dL   Borderline  160-189  mg/dL   High  >190     mg/dL   Very High    Hemoglobin and hematocrit, blood     Status: Abnormal    Collection Time: 07/08/16  8:36 PM  Result Value Ref Range    Hemoglobin 7.7 (L) 13.0 - 17.0 g/dL    HCT 23.3 (L) 39.0 - 78.2 %  Basic metabolic panel     Status: Abnormal    Collection Time: 07/09/16  2:28 AM  Result Value Ref Range    Sodium 137 135 - 145 mmol/L    Potassium 4.0 3.5 - 5.1 mmol/L    Chloride 111 101 - 111 mmol/L    CO2 20 (L) 22 - 32 mmol/L    Glucose, Bld 121 (H) 65 - 99 mg/dL    BUN 50 (H) 6 - 20 mg/dL    Creatinine, Ser 1.36 (H) 0.61 - 1.24 mg/dL    Calcium 8.2 (L) 8.9 - 10.3 mg/dL    GFR calc non Af Amer 44 (L) >60  mL/min    GFR calc Af Amer 51 (L) >60 mL/min      Comment: (NOTE) The eGFR has been calculated using the CKD EPI equation. This calculation has not been validated in all clinical situations. eGFR's persistently <60 mL/min signify possible Chronic Kidney Disease.      Anion gap 6 5 - 15  CBC     Status: Abnormal    Collection Time: 07/09/16  2:28 AM  Result Value Ref  Range    WBC 13.6 (H) 4.0 - 10.5 K/uL    RBC 2.78 (L) 4.22 - 5.81 MIL/uL    Hemoglobin 8.2 (L) 13.0 - 17.0 g/dL    HCT 25.1 (L) 39.0 - 52.0 %    MCV 90.3 78.0 - 100.0 fL    MCH 29.5 26.0 - 34.0 pg    MCHC 32.7 30.0 - 36.0 g/dL    RDW 14.3 11.5 - 15.5 %    Platelets 249 150 - 400 K/uL  Prepare RBC     Status: None    Collection Time: 07/09/16  7:33 AM  Result Value Ref Range    Order Confirmation ORDER PROCESSED BY BLOOD BANK    Type and screen [place]     Status: None (Preliminary result)    Collection Time: 07/09/16  8:11 AM  Result Value Ref Range    ABO/RH(D) A POS      Antibody Screen NEG      Sample Expiration 07/12/2016      Unit Number [id number]      Blood Component Type RBC, LR IRR      Unit division 00      Status of Unit ISSUED      Transfusion Status OK TO TRANSFUSE      Crossmatch Result Compatible    ABO/Rh     Status: None    Collection Time: 07/09/16  8:11 AM  Result Value Ref Range    ABO/RH(D) A POS         Imaging Results (Last 48 hours)  Mr Cervical Spine Wo Contrast   Result Date: 07/08/2016 CLINICAL DATA:  Episode of weakness and numbness in the lower extremities bilaterally 07/03/2016. Acute/subacute left temporal lobe infarct. Abnormal signal within the upper thoracic spinal cord on the cervical spine MRI. The examination had to be discontinued prior to completion due to patient refusal for further imaging. EXAM: MRI CERVICAL AND THORACIC SPINE WITHOUT CONTRAST TECHNIQUE: Multiplanar and multiecho pulse sequences of the cervical spine, to include the  craniocervical junction and cervicothoracic junction, and thoracic spine, were obtained without intravenous contrast. COMPARISON:  MRI of cervical spine 07/06/2016. FINDINGS: MRI CERVICAL SPINE FINDINGS Alignment: AP alignment is anatomic. There straightening of the normal cervical lordosis. Vertebrae: Chronic endplate marrow changes are again noted at C4-5 and on the right at C6-7. Vertebral body heights are maintained. Cord: Cervical spinal cord signal is normal. Posterior Fossa, vertebral arteries, paraspinal tissues: The craniocervical junction is normal. The visualized intracranial contents are normal. Flow is present in the vertebral arteries bilaterally. Disc levels: Disc disease is not significantly changed. C2-3: Moderate right foraminal narrowing is due to uncovertebral and facet spurring. C3-4: A broad-based disc osteophyte complex and facet hypertrophy contributes to moderate left greater than right foraminal narrowing and moderate right central canal stenosis. C4-5: Moderate to severe central canal stenosis is present. There is distortion of the spinal cord. The canal is narrowed to 7 mm. Severe foraminal stenosis is worse on the left. C5-6: A rightward disc osteophyte complex is present. There is partial effacement of the ventral CSF. Moderate right and mild left foraminal narrowing is present. C6-7: Uncovertebral spurring bilaterally is stable. Mild foraminal narrowing is worse on the left. C7-T1: Moderate facet hypertrophy is present bilaterally without significant stenosis. MRI THORACIC SPINE FINDINGS Alignment:  AP alignment is anatomic. Vertebrae: There is diffuse heterogeneous marrow signal. Abnormal signal is present diffusely within the T3 vertebral body involving the posterior elements bilaterally. There is abnormal  signal on the right at T4 extending into the posterior elements. Epidural soft tissue extends cephalo caudad 4.3 cm from the mid body aches T2-3 the midbody of T4. This results in  significant central canal stenosis. There is abnormal signal within the spinal cord at this level. Extensive T2 signal changes are present within the soft tissues posterior to the thoracic spine at this level as well. The lesion at T9 measures 3.1 x 1.9 cm on the right. A smaller superior right-sided lesion measures 9 mm. A 10.5 mm lesion is evident posteriorly and inferiorly on the left at T11. No other definite involvement of posterior elements is noted. There is also abnormal signal in the right pedicle of T4. Cord: Abnormal cord signal is present from the midbody of T2 through T4 associated with cord compression by extraosseous soft tissue mass. Paraspinal and other soft tissues: Abnormal signal is present within the posterior paraspinal soft tissues T2-4. Disc levels: No significant focal disc protrusion is present. Facet hypertrophy contributes to mild right foraminal narrowing at T2-3. The foramina are otherwise patent. No other significant central canal stenosis is present. IMPRESSION: 1. Multiple osseous lesions within the thoracic spine, most notably at T3, T4, T9, and T11, suggesting metastatic disease. Multiple myeloma is also considered. 2. Extraosseous soft tissue mass likely reflecting tumor extension at the T3 level results in severe central canal stenosis with mass effect on the spinal cord. Abnormal cord signal a a mix extends from the midbody of T2 through the midbody of T4 . Central cord tumor is considered less likely. 3. Soft tissue signal changes posteriorly at the T2 level and extending above. This may reflect tumor or infection. 4. No significant extraosseous extension at the T9 or T10 levels. 5. Stable multilevel spondylosis in the cervical spine. 6. Heterogeneous marrow signal of the cervical spine appears be mostly discogenic. Additional sites of metastatic disease are not excluded. 7. The patient refused further imaging with contrast. Critical Value/emergent results were called by  telephone at the time of interpretation on 07/08/2016 at 11:51 am to Dr. Rosalin Hawking , who verbally acknowledged these results. Electronically Signed   By: San Morelle M.D.   On: 07/08/2016 12:23    Mr Thoracic Spine Wo Contrast   Result Date: 07/08/2016 CLINICAL DATA:  Episode of weakness and numbness in the lower extremities bilaterally 07/03/2016. Acute/subacute left temporal lobe infarct. Abnormal signal within the upper thoracic spinal cord on the cervical spine MRI. The examination had to be discontinued prior to completion due to patient refusal for further imaging. EXAM: MRI CERVICAL AND THORACIC SPINE WITHOUT CONTRAST TECHNIQUE: Multiplanar and multiecho pulse sequences of the cervical spine, to include the craniocervical junction and cervicothoracic junction, and thoracic spine, were obtained without intravenous contrast. COMPARISON:  MRI of cervical spine 07/06/2016. FINDINGS: MRI CERVICAL SPINE FINDINGS Alignment: AP alignment is anatomic. There straightening of the normal cervical lordosis. Vertebrae: Chronic endplate marrow changes are again noted at C4-5 and on the right at C6-7. Vertebral body heights are maintained. Cord: Cervical spinal cord signal is normal. Posterior Fossa, vertebral arteries, paraspinal tissues: The craniocervical junction is normal. The visualized intracranial contents are normal. Flow is present in the vertebral arteries bilaterally. Disc levels: Disc disease is not significantly changed. C2-3: Moderate right foraminal narrowing is due to uncovertebral and facet spurring. C3-4: A broad-based disc osteophyte complex and facet hypertrophy contributes to moderate left greater than right foraminal narrowing and moderate right central canal stenosis. C4-5: Moderate to severe central canal stenosis is  present. There is distortion of the spinal cord. The canal is narrowed to 7 mm. Severe foraminal stenosis is worse on the left. C5-6: A rightward disc osteophyte complex is  present. There is partial effacement of the ventral CSF. Moderate right and mild left foraminal narrowing is present. C6-7: Uncovertebral spurring bilaterally is stable. Mild foraminal narrowing is worse on the left. C7-T1: Moderate facet hypertrophy is present bilaterally without significant stenosis. MRI THORACIC SPINE FINDINGS Alignment:  AP alignment is anatomic. Vertebrae: There is diffuse heterogeneous marrow signal. Abnormal signal is present diffusely within the T3 vertebral body involving the posterior elements bilaterally. There is abnormal signal on the right at T4 extending into the posterior elements. Epidural soft tissue extends cephalo caudad 4.3 cm from the mid body aches T2-3 the midbody of T4. This results in significant central canal stenosis. There is abnormal signal within the spinal cord at this level. Extensive T2 signal changes are present within the soft tissues posterior to the thoracic spine at this level as well. The lesion at T9 measures 3.1 x 1.9 cm on the right. A smaller superior right-sided lesion measures 9 mm. A 10.5 mm lesion is evident posteriorly and inferiorly on the left at T11. No other definite involvement of posterior elements is noted. There is also abnormal signal in the right pedicle of T4. Cord: Abnormal cord signal is present from the midbody of T2 through T4 associated with cord compression by extraosseous soft tissue mass. Paraspinal and other soft tissues: Abnormal signal is present within the posterior paraspinal soft tissues T2-4. Disc levels: No significant focal disc protrusion is present. Facet hypertrophy contributes to mild right foraminal narrowing at T2-3. The foramina are otherwise patent. No other significant central canal stenosis is present. IMPRESSION: 1. Multiple osseous lesions within the thoracic spine, most notably at T3, T4, T9, and T11, suggesting metastatic disease. Multiple myeloma is also considered. 2. Extraosseous soft tissue mass likely  reflecting tumor extension at the T3 level results in severe central canal stenosis with mass effect on the spinal cord. Abnormal cord signal a a mix extends from the midbody of T2 through the midbody of T4 . Central cord tumor is considered less likely. 3. Soft tissue signal changes posteriorly at the T2 level and extending above. This may reflect tumor or infection. 4. No significant extraosseous extension at the T9 or T10 levels. 5. Stable multilevel spondylosis in the cervical spine. 6. Heterogeneous marrow signal of the cervical spine appears be mostly discogenic. Additional sites of metastatic disease are not excluded. 7. The patient refused further imaging with contrast. Critical Value/emergent results were called by telephone at the time of interpretation on 07/08/2016 at 11:51 am to Dr. Rosalin Hawking , who verbally acknowledged these results. Electronically Signed   By: San Morelle M.D.   On: 07/08/2016 12:23    Ct Abdomen Pelvis W Contrast   Result Date: 07/08/2016 CLINICAL DATA:  Hematuria.  History of prostate cancer. EXAM: CT ABDOMEN AND PELVIS WITH CONTRAST TECHNIQUE: Multidetector CT imaging of the abdomen and pelvis was performed using the standard protocol following bolus administration of intravenous contrast. CONTRAST:  74m ISOVUE-300 IOPAMIDOL (ISOVUE-300) INJECTION 61% COMPARISON:  CT scan of January 26, 2016. FINDINGS: Lower chest: Mild bibasilar posterior subsegmental atelectasis is noted. Hepatobiliary: No focal liver abnormality is seen. Status post cholecystectomy. No biliary dilatation. Pancreas: Unremarkable. No pancreatic ductal dilatation or surrounding inflammatory changes. Spleen: Normal in size without focal abnormality. Adrenals/Urinary Tract: Adrenal glands are unremarkable. Bilateral parapelvic cysts are noted.  Nonobstructive 1 cm calculus is seen in middle pole infundibulum. Mild bilateral hydroureteronephrosis is noted without obstructing calculus. Urinary bladder is  decompressed secondary to Foley catheter. Several bladder calculi are noted. 4.5 x 2.9 cm irregular lobulated mass appears to be involving the posterior wall of the urinary bladder which is slightly enlarged compared to prior exam. This is most consistent with invasive and recurrent prostate cancer. Cystoscopy is recommended for further evaluation. Stomach/Bowel: Stomach is unremarkable. Status post right hemicolectomy, with small bowel dilatation extending to the surgical anastomosis. This is concerning for at least partial small bowel obstruction. Vascular/Lymphatic: Aortic atherosclerosis. No enlarged abdominal or pelvic lymph nodes. Reproductive: As noted above, 4.5 x 2.9 cm lobulated mass is seen in posterior inferior portion of urinary bladder concerning for invasive and recurrent prostate cancer. Other: Penile reservoir is noted in left lower quadrant. No hernia or abnormal fluid collection is noted. Musculoskeletal: 2 sclerotic lesions are noted in the left iliac wing which are increased compared to prior exam and consistent with metastatic disease. Stable sclerotic density seen in right iliac wing concerning for metastatic disease. New metastatic focus is noted in inferior portion of left ischial tuberosity. IMPRESSION: Mild bibasilar subsegmental atelectasis. Nonobstructive 1 cm right renal calculus. Mild bilateral hydroureteronephrosis is noted without obstructing calculus. Urinary bladder is decompressed secondary to Foley catheter. Several bladder calculi are noted. 4.5 x 2.9 cm irregular lobulated mass appears to be involving the posterior and inferior wall of urinary bladder which is slightly enlarged compared to prior exam. This is concerning for invasive recurrent prostate cancer, and cystoscopy is recommended for further evaluation. Status post right hemicolectomy. Moderate small bowel dilatation is noted which extends to the surgical anastomosis in the right side of the abdomen, concerning for  partial small bowel obstruction. Aortic atherosclerosis. Osseous metastatic lesions are noted in the left side of the pelvis which are increased compared to prior exam. Electronically Signed   By: Marijo Conception, M.D.   On: 07/08/2016 15:55             Medical Problem List and Plan: 1.  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 PE/Right peroneal DVT/Anticoagulation: Pharmaceutical: Other (comment)--Eliquis 3. Pain Management: tylenol prn.  4. Mood: LCSW to follow for evaluation and support.  5. Neuropsych: This patient is not capable of making decisions on his own behalf. 6. Skin/Wound Care: Air mattress for pressure relief and turn frequently to help manage MASD. Will order PRAFO's for BLE. Add protein supplement due to low albumin levels and to promote healing.  7. Fluids/Electrolytes/Nutrition: Monitor I/O. Check lytes in am.  8. Metastatic thoracic cancer: Will add abdominal binder and ACE wrap BLE to support BP. Continue trial of steroids.  9. Hematuria: Monitor H/H serially and urine output. Resume CBI prn clots or discomfort.   10. 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  11. Delirium: Supportive care--monitor as cognition has worsened on steroids (per daughter).  Decadron decreased to 4 mg IV qid.  12. B 12 deficiency: Now on B 12 injections for supplement 13. AKI: Avoid hypotension and nephrotoxic medications. SCr improved from 2.61--> 1.3 range with IVF for hydration--question discontinue.   14. Leucocytosis: Likely reactive--monitor for now.  15. Diarrhea: Will check KUB due to bowel distension and to rule out SBO. If negative may need bulking agent for continence.      Post Admission Physician Evaluation: 1. Functional deficits secondary  to paraplegia. 2. Patient is admitted to receive collaborative, interdisciplinary care between the  physiatrist, rehab nursing staff, and therapy team. 3. Patient's level of medical complexity and substantial  therapy needs in context of that medical necessity cannot be provided at a lesser intensity of care such as a SNF. 4. Patient has experienced substantial functional loss from his/her baseline which was documented above under the "Functional History" and "Functional Status" headings.  Judging by the patient's diagnosis, physical exam, and functional history, the patient has potential for functional progress which will result in measurable gains while on inpatient rehab.  These gains will be of substantial and practical use upon discharge  in facilitating mobility and self-care at the household level. 5. Physiatrist will provide 24 hour management of medical needs as well as oversight of the therapy plan/treatment and provide guidance as appropriate regarding the interaction of the two. 6. The Preadmission Screening has been reviewed and patient status is unchanged unless otherwise stated above. 7. 24 hour rehab nursing will assist with bladder management, bowel management, safety, skin/wound care, disease management, medication administration, pain management and patient education  and help integrate therapy concepts, techniques,education, etc. 8. PT will assess and treat for/with: pre gait, gait training, endurance , safety, equipment, neuromuscular re education.   Goals are: Mod A transfers WC level. 9. OT will assess and treat for/with: ADLs, Cognitive perceptual skills, Neuromuscular re education, safety, endurance, equipment.   Goals are: Max A LE ADL sup UE ADL. Therapy may proceed with showering this patient. 10. SLP will assess and treat for/with: asess cognition.  Goals are: mod I med management. 11. Case Management and Social Worker will assess and treat for psychological issues and discharge planning. 12. Team conference will be held weekly to assess progress toward goals and to determine barriers to discharge. 13. Patient will receive at least 3 hours of therapy per day at least 5 days per  week. 14. ELOS: 12-14d mainly for family training       15. Prognosis:  fair in short term for WC level goals         Charlett Blake M.D. [facility] FAAPM&R (Sports Med, Neuromuscular Med) Diplomate Am Board of Electrodiagnostic Med  Flora Lipps 07/09/2016

## 2016-07-11 ENCOUNTER — Inpatient Hospital Stay (HOSPITAL_COMMUNITY): Payer: Medicare Other | Admitting: Occupational Therapy

## 2016-07-11 ENCOUNTER — Inpatient Hospital Stay (HOSPITAL_COMMUNITY): Payer: Medicare Other

## 2016-07-11 DIAGNOSIS — N319 Neuromuscular dysfunction of bladder, unspecified: Secondary | ICD-10-CM | POA: Diagnosis present

## 2016-07-11 DIAGNOSIS — K592 Neurogenic bowel, not elsewhere classified: Secondary | ICD-10-CM | POA: Diagnosis present

## 2016-07-11 LAB — COMPREHENSIVE METABOLIC PANEL
ALBUMIN: 2.2 g/dL — AB (ref 3.5–5.0)
ALT: 67 U/L — ABNORMAL HIGH (ref 17–63)
ANION GAP: 8 (ref 5–15)
AST: 72 U/L — AB (ref 15–41)
Alkaline Phosphatase: 105 U/L (ref 38–126)
BILIRUBIN TOTAL: 0.8 mg/dL (ref 0.3–1.2)
BUN: 43 mg/dL — ABNORMAL HIGH (ref 6–20)
CHLORIDE: 116 mmol/L — AB (ref 101–111)
CO2: 17 mmol/L — ABNORMAL LOW (ref 22–32)
Calcium: 8.5 mg/dL — ABNORMAL LOW (ref 8.9–10.3)
Creatinine, Ser: 0.86 mg/dL (ref 0.61–1.24)
GFR calc Af Amer: >60 mL/min (ref >60)
GFR calc non Af Amer: >60 mL/min (ref >60)
GLUCOSE: 107 mg/dL — AB (ref 65–99)
POTASSIUM: 4.8 mmol/L (ref 3.5–5.1)
SODIUM: 141 mmol/L (ref 135–145)
TOTAL PROTEIN: 4.7 g/dL — AB (ref 6.5–8.1)

## 2016-07-11 LAB — CBC WITH DIFFERENTIAL/PLATELET
BASOS ABS: 0 10*3/uL (ref 0.0–0.1)
Basophils Relative: 0 %
Eosinophils Absolute: 0 10*3/uL (ref 0.0–0.7)
Eosinophils Relative: 0 %
HEMATOCRIT: 29.5 % — AB (ref 39.0–52.0)
Hemoglobin: 9.5 g/dL — ABNORMAL LOW (ref 13.0–17.0)
LYMPHS ABS: 1.2 10*3/uL (ref 0.7–4.0)
LYMPHS PCT: 8 %
MCH: 29.1 pg (ref 26.0–34.0)
MCHC: 32.2 g/dL (ref 30.0–36.0)
MCV: 90.2 fL (ref 78.0–100.0)
MONO ABS: 1.7 10*3/uL — AB (ref 0.1–1.0)
Monocytes Relative: 10 %
NEUTROS ABS: 13.6 10*3/uL — AB (ref 1.7–7.7)
Neutrophils Relative %: 82 %
Platelets: 367 10*3/uL (ref 150–400)
RBC: 3.27 MIL/uL — AB (ref 4.22–5.81)
RDW: 16.7 % — ABNORMAL HIGH (ref 11.5–15.5)
WBC: 16.5 10*3/uL — AB (ref 4.0–10.5)

## 2016-07-11 MED ORDER — APIXABAN 2.5 MG PO TABS
2.5000 mg | ORAL_TABLET | Freq: Two times a day (BID) | ORAL | Status: DC
  Administered 2016-07-11 – 2016-07-16 (×9): 2.5 mg via ORAL
  Filled 2016-07-11 (×9): qty 1

## 2016-07-11 NOTE — Evaluation (Signed)
Physical Therapy Assessment and Plan  Patient Details  Name: JAIDEN DINKINS MRN: [id number] Date of Birth: [birth year]  PT Diagnosis: Difficulty walking, Hypotonia, Impaired cognition, Impaired sensation, Muscle weakness, Paraplegia, Pain in joint and Paralysis Rehab Potential: Fair ELOS: 21-24 days   Today's Date: 07/11/2016 PT Individual Time: 1100-1200 PT Individual Time Calculation (min): 60 min    Problem List:  Patient Active Problem List   Diagnosis Date Noted  . Neurogenic bowel 07/11/2016  . Neurogenic urinary bladder disorder 07/11/2016  . Metastatic cancer to spine ([place]) 07/14/2016  . History of cancer   . Paraplegia at T4 level ([place])   . Spinal cord compression ([place])   . Embolic stroke ([place]) 96/75/9163  . Pressure injury of skin 07/07/2016  . DVT (deep vein thrombosis) in pregnancy ([place])   . ARF (acute renal failure) ([place]) 07/05/2016  . Leg weakness   . Dehydration 07/04/2016  . 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 Urinary incontinence, male, stress 07/04/2016  . Acute lower UTI 07/04/2016  . B12 deficiency 07/04/2016  . Pulmonary embolism on right ([place])   . Thoracic back pain   . Pulmonary embolism ([place]) 07/03/2016  . Generalized weakness 07/03/2016  . Hypertension 07/03/2016  . Hyperlipidemia 07/03/2016  . Gross hematuria 07/03/2016  . Bilateral leg weakness 07/03/2016  . Hypothermia 07/03/2016    Past Medical History:  Past Medical History:  Diagnosis Date  . Blind left eye    retinal detached retina  . 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 Chronic kidney disease    hx kidney stones and urinary incontinence  . Colon cancer ([place])   . Coronary artery disease   . Diverticulosis   . DJD (degenerative joint disease)   . Glaucoma   . Hyperlipemia   . Hypertension   . Lipoma of back   . Melanoma of back ([place])   . Mild mitral regurgitation   . Prostate cancer ([place])   . Retinal detachment   . Urinary incontinence   . Urinary incontinence, male,  stress 07/04/2016   Past Surgical History:  Past Surgical History:  Procedure Laterality Date  . APPENDECTOMY    . CARDIAC CATHETERIZATION    . CHOLECYSTECTOMY    . CORONARY ARTERY BYPASS GRAFT  1982   1 vessel  . CYSTOSCOPY  09/21/2011   Procedure: CYSTOSCOPY;  Surgeon: Reece Packer, MD;  Location: [facility];  Service: Urology;  Laterality: N/A;  . EYE SURGERY     detached retinas bilateral,bil.cataract  . TONSILLECTOMY     as child  . URINARY SPHINCTER IMPLANT  09/21/2011   Procedure: ARTIFICIAL URINARY SPHINCTER;  Surgeon: Reece Packer, MD;  Location: [facility];  Service: Urology;  Laterality: N/A;    Assessment & Plan Clinical Impression: Patient is a 83 y.o. year old male with recent admission to the hospital with history of HTN, CAD s/p CABG, legally blind, prostate cancer, melanoma, colon cancer who was admitted on 07/03/16 with BLE weakness with numbness, inability to walk, slurred speech as well as back and chest pain. He was found to have UTI with hematuria and started on rocephin as well as CBI per [name]. CTA chest showed right PE and IV heparin started. He has had issues with confusion as well as worsening of renal status. [name] consulted and felt that this was due to ATN from hypotension and NSAIDs use. Renal ultrasound with cortical thinning and no hydronephrosis. MRI brain with acute small left temporal lobe/hippocampal infarct. [name] felt that  stroke embolic due to paradoxical emboli from RLE DVT or hypercoagulable state. 2 D echo with EF 60- 65% and no SOE. Heparin changed to low dose eliquis due to ongoing hematuria. BC/UCS negative and rocephin discontinued 6/19.   CT abdomen pelvis done due to ongoing hematuria and showed osseous metastatic lesions left pelvis-increased from prior exam, 1 cm non obstructive renal calculi with 4.5 X 2.9 cm irregular bladder mass (slightly enlarged) concerning for invasive recurrent prostate cancer as well as moderate small  bowel distension concerning for partial SBO. MRI spine showed multiple osseous lesion T3, T4, T9 and T11 with extraosseous tumor extenison at T3 level with severe central canal stenosis and mass effect on spinal cord, abnormal signal from T2-T4 mid body, soft tissue signal from T2 to above suspected mild thoracic sprain and moderate to sever canal stenosis C3-4--limited study as patient refused contrast. [name] consulted and recommended trial of IV decadron for a few days--bracing prn for comfort. CBI discontinued as bleeding likely due to bladder lesion and has been transfused with one unit PRBC 6/22 for drop in H/H. Therapy ongoing with BLE wrapped to help support BP. CIR recommended for follow up therapy.   Discussed with Pt and Daughter (POA) wishes to be DNR.  Patient transferred to CIR on 06/27/2016 .   Patient currently requires total +2 with mobility secondary to muscle weakness, muscle joint tightness and muscle paralysis, decreased cardiorespiratoy endurance, abnormal tone, decreased visual acuity, decreased midline orientation, decreased attention, decreased awareness, decreased problem solving and decreased memory and decreased sitting balance, decreased standing balance, decreased postural control and decreased balance strategies.  Prior to hospitalization, patient was modified independent  with mobility and lived with Spouse in a House home.  Home access is 8-10 from front of houseStairs to enter.  Patient will benefit from skilled PT intervention to maximize safe functional mobility, minimize fall risk and decrease caregiver burden for planned discharge home with 24 hour assist.  Anticipate patient will benefit from follow up [facility] at discharge.  PT - End of Session Activity Tolerance: Decreased this session Endurance Deficit: Yes PT Assessment Rehab Potential (ACUTE/IP ONLY): Fair Barriers to Discharge: Inaccessible home environment;Decreased caregiver support (will need ramp and  hired caregivers (wife unable)) PT Patient demonstrates impairments in the following area(s): Balance;Edema;Endurance;Motor;Pain;Perception;Safety;Sensory;Skin Integrity PT Transfers Functional Problem(s): Bed Mobility;Bed to Chair;Car;Furniture PT Locomotion Functional Problem(s): Ambulation;Wheelchair Mobility;Stairs PT Plan PT Intensity: Minimum of 1-2 x/day ,45 to 90 minutes PT Frequency: 5 out of 7 days PT Duration Estimated Length of Stay: 21-24 days PT Treatment/Interventions: Balance/vestibular training;Cognitive remediation/compensation;Discharge planning;Disease management/prevention;DME/adaptive equipment instruction;Functional mobility training;Neuromuscular re-education;Pain management;Patient/family education;Psychosocial support;Skin care/wound management;Splinting/orthotics;Therapeutic Activities;Therapeutic Exercise;UE/LE Strength taining/ROM;UE/LE Coordination activities;Visual/perceptual remediation/compensation;Wheelchair propulsion/positioning PT Transfers Anticipated Outcome(s): mod assist bed mobility; max assist transfers; likely will require hoyer lift for caregivers PT Locomotion Anticipated Outcome(s): min assist w/c mobility for strengthening with cues due to visual deficits; total A functionally PT Recommendation Follow Up Recommendations: Home health PT;24 hour supervision/assistance Patient destination: Home Equipment Recommended: Wheelchair (measurements);Wheelchair cushion (measurements);Other (comment);Sliding board (may require hoyer)  Skilled Therapeutic Intervention Evaluation completed (see details above and below) with education on PT POC and goals and individual treatment initiated with focus on functional rolling in the bed for donning of sling, use of maxi move for safety for transfer in w/c, education on importance of OOB tolerance and increasing upright tolerance, and adjustments to w/c for improved sitting tolerance, postural alignment, and head support.  Pt requires total assist for repositioning in w/c but able to  use BUE to pull trunk forward using armrests and support himself this way - unable to demonstrate active trunk control without this UE support. Recommended soft touch call bell to RN.    PT Evaluation Precautions/Restrictions Precautions Precautions: Fall Precaution Comments: watch BP; BLE wraps Required Braces or Orthoses: Other Brace/Splint Other Brace/Splint: Bilateral PRAFOs for in bed Restrictions Weight Bearing Restrictions: No General Therapy Vitals BP: 136/68 Patient Position (if appropriate): Sitting Pain Reports discomfort in his tongue throughout session. Offered oral hygiene but pt declined. Reports he told RN. Home Living/Prior Functioning Home Living Available Help at Discharge: Family;Available 24 hours/day (will need to hire caregivers) Type of Home: House Home Access: Stairs to enter [employer] of Steps: 8-10 from front of house Home Layout: Two level;Laundry or work area in Building surveyor of Steps: flight Additional Comments: per chart has a stair lift from basement; pt unable to report home set-up  Lives With: Spouse Prior Function Level of Independence: Independent with transfers;Needs assistance with homemaking  Able to Take Stairs?: Yes (has a stair lift so generally avoided them) Comments: Does not drive due to visual impairments; Helps out around the house some (loading/unloading dishwasher, taking out trash, etc) Vision/Perception  Vision - History Patient Visual Report:  (pt blind in L eye; minimal vision in R)  Cognition Overall Cognitive Status: Impaired/Different from baseline Orientation Level: Oriented to person;Oriented to place Awareness: Impaired Problem Solving: Impaired Comments: Conversation disjointed and tangential;  (HOH) Sensation Sensation Light Touch: Impaired Detail Light Touch Impaired Details: Impaired LLE;Impaired  RLE Proprioception: Impaired Detail Proprioception Impaired Details: Impaired LLE;Impaired RLE Additional Comments: Reports some sensation in BLE but unable to locate area or identify how PT is moving them in space Coordination Gross Motor Movements are Fluid and Coordinated: No Motor  Motor Motor: Paraplegia;Abnormal tone;Abnormal postural alignment and control     Trunk/Postural Assessment  Cervical Assessment Cervical Assessment: Within Functional Limits Thoracic Assessment Thoracic Assessment: Exceptions to [facility] (impaired trunk control; kyphotic posture) Lumbar Assessment Lumbar Assessment: Exceptions to [facility] (posterior pelvic tilt; impaired trunk control d/t paraplegia) Postural Control Postural Control: Deficits on evaluation Trunk Control: impaired due to SCI; up to total assist Righting Reactions: impaired Protective Responses: delayed and inadequate  Balance Balance Balance Assessed: Yes Static Sitting Balance Static Sitting - Level of Assistance: 2: Max assist Dynamic Sitting Balance Dynamic Sitting - Level of Assistance: 1: +1 Total assist Extremity Assessment      RLE Assessment RLE Assessment: Exceptions to [facility] (no active movement noted) RLE Tone RLE Tone Comments: flaccid LLE Assessment LLE Assessment: Exceptions to [facility] (spontaneous movement of L toes; no movement on command) LLE Tone LLE Tone Comments: flaccid   See Function Navigator for Current Functional Status.   Refer to Care Plan for Long Term Goals  Recommendations for other services: None  and Other: may benefit from speech consult  Discharge Criteria: Patient will be discharged from PT if patient refuses treatment 3 consecutive times without medical reason, if treatment goals not met, if there is a change in medical status, if patient makes no progress towards goals or if patient is discharged from hospital.  The above assessment, treatment plan, treatment alternatives and goals were discussed  and mutually agreed upon: by patient  Juanna Cao, PT, DPT  07/11/2016, 12:34 PM

## 2016-07-11 NOTE — Evaluation (Signed)
Occupational Therapy Assessment and Plan  Patient Details  Name: Luis Pierce MRN: [id number] Date of Birth: Mar 08, 1922  OT Diagnosis: abnormal posture, blindness and low vision, cognitive deficits, muscle weakness (generalized) and paraplegia Rehab Potential: Rehab Potential (ACUTE ONLY): Fair ELOS: 21-24 days   Today's Date: 81/24/2018 OT Individual Time: 8469-6295 OT Individual Time Calculation (min): 67 min     Problem List:  Patient Active Problem List   Diagnosis Date Noted  . Neurogenic bowel 07/11/2016  . Neurogenic urinary bladder disorder 07/11/2016  . Metastatic cancer to spine ([place]) 06/27/2016  . History of cancer   . Paraplegia at T4 level ([place])   . Spinal cord compression ([place])   . Embolic stroke ([place]) 28/81/3244  . Pressure injury of skin 07/07/2016  . DVT (deep vein thrombosis) in pregnancy ([place])   . ARF (acute renal failure) ([place]) 07/05/2016  . Leg weakness   . Dehydration 07/04/2016  . 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 Urinary incontinence, male, stress 07/04/2016  . Acute lower UTI 07/04/2016  . B12 deficiency 07/04/2016  . Pulmonary embolism on right ([place])   . Thoracic back pain   . Pulmonary embolism ([place]) 07/03/2016  . Generalized weakness 07/03/2016  . Hypertension 07/03/2016  . Hyperlipidemia 07/03/2016  . Gross hematuria 07/03/2016  . Bilateral leg weakness 07/03/2016  . Hypothermia 07/03/2016    Past Medical History:  Past Medical History:  Diagnosis Date  . Blind left eye    retinal detached retina  . 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 Chronic kidney disease    hx kidney stones and urinary incontinence  . Colon cancer ([place])   . Coronary artery disease   . Diverticulosis   . DJD (degenerative joint disease)   . Glaucoma   . Hyperlipemia   . Hypertension   . Lipoma of back   . Melanoma of back ([place])   . Mild mitral regurgitation   . Prostate cancer ([place])   . Retinal detachment   . Urinary incontinence   . Urinary  incontinence, male, stress 07/04/2016   Past Surgical History:  Past Surgical History:  Procedure Laterality Date  . APPENDECTOMY    . CARDIAC CATHETERIZATION    . CHOLECYSTECTOMY    . CORONARY ARTERY BYPASS GRAFT  1982   1 vessel  . CYSTOSCOPY  09/21/2011   Procedure: CYSTOSCOPY;  Surgeon: Reece Packer, MD;  Location: [facility];  Service: Urology;  Laterality: N/A;  . EYE SURGERY     detached retinas bilateral,bil.cataract  . TONSILLECTOMY     as child  . URINARY SPHINCTER IMPLANT  09/21/2011   Procedure: ARTIFICIAL URINARY SPHINCTER;  Surgeon: Reece Packer, MD;  Location: [facility];  Service: Urology;  Laterality: N/A;    Assessment & Plan Clinical Impression: Luis Pierce is a 83 year old male with history of HTN, CAD s/p CABG, legally blind, prostate cancer, melanoma, colon cancer who was admitted on 07/03/16 with BLE weakness with numbness, inability to walk, slurred speech as well as back and chest pain. He was found to have UTI with hematuria and started on rocephin as well as CBI per [name]. CTA chest showed right PE and IV heparin started. He has had issues with confusion as well as worsening of renal status. [name] consulted and felt that this was due to ATN from hypotension and NSAIDs use. Renal ultrasound with cortical thinning and no hydronephrosis. MRI brain with acute small left temporal lobe/hippocampal infarct. [name] felt that stroke embolic due  to paradoxical emboli from RLE DVT or hypercoagulable state. 2 D echo with EF 60- 65% and no SOE. Heparin changed to low dose eliquis due to ongoing hematuria. BC/UCS negative and rocephin discontinued 6/19.   CT abdomen pelvis done due to ongoing hematuria and showed osseous metastatic lesions left pelvis-increased from prior exam, 1 cm non obstructive renal calculi with 4.5 X 2.9 cm irregular bladder mass (slightly enlarged) concerning for invasive recurrent prostate cancer as well as moderate small bowel distension  concerning for partial SBO. MRI spine showed multiple osseous lesion T3, T4, T9 and T11 with extraosseous tumor extenison at T3 level with severe central canal stenosis and mass effect on spinal cord, abnormal signal from T2-T4 mid body, soft tissue signal from T2 to above suspected mild thoracic sprain and moderate to sever canal stenosis C3-4--limited study as patient refused contrast. [name] consulted and recommended trial of IV decadron for a few days--bracing prn for comfort. CBI discontinued as bleeding likely due to bladder lesion and has been transfused with one unit PRBC 6/22 for drop in H/H. Therapy ongoing with BLE wrapped to help support BP. CIR recommended for follow up therapy.   Discussed with Pt and Daughter (POA) wishes to be DNR  Patient currently requires total with basic self-care skills secondary to muscle weakness and muscle paralysis, decreased cardiorespiratoy endurance, impaired timing and sequencing, abnormal tone, unbalanced muscle activation, motor apraxia and decreased coordination, decreased midline orientation, decreased awareness, decreased problem solving, decreased safety awareness and decreased memory and decreased sitting balance, decreased postural control and decreased balance strategies.  Prior to hospitalization, patient could complete BADLs with supervision.  Patient will benefit from skilled intervention to increase independence with basic self-care skills prior to discharge home with spouse and trained caregivers. Anticipate patient will require Mod-Max physical assistance and no further OT follow recommended.  OT - End of Session Endurance Deficit: Yes OT Assessment Rehab Potential (ACUTE ONLY): Fair Barriers to Discharge: Decreased caregiver support OT Patient demonstrates impairments in the following area(s): Balance;Safety;Sensory;Cognition;Skin Integrity;Endurance;Motor;Pain;Vision;Perception OT Basic ADL's Functional Problem(s):  Eating;Grooming;Bathing;Dressing;Toileting OT Advanced ADL's Functional Problem(s): Simple Meal Preparation OT Transfers Functional Problem(s): Toilet;Tub/Shower OT Plan OT Intensity: Minimum of 1-2 x/day, 45 to 90 minutes OT Frequency: 5 out of 7 days OT Duration/Estimated Length of Stay: 21-24 days OT Treatment/Interventions: Balance/vestibular training;Cognitive remediation/compensation;Discharge planning;Self Care/advanced ADL retraining;Therapeutic Activities;Therapeutic Exercise;Patient/family education;Functional mobility training;DME/adaptive equipment instruction;Neuromuscular re-education;Psychosocial support;UE/LE Strength taining/ROM;Wheelchair propulsion/positioning OT Self Feeding Anticipated Outcome(s): Mod A OT Basic Self-Care Anticipated Outcome(s): Mod A UB, Max A LB OT Toileting Anticipated Outcome(s): N/A, anticipate pt will still require Total A OT Bathroom Transfers Anticipated Outcome(s): N/A, anticipate pt will still require Total A OT Recommendation Recommendations for Other Services: Speech consult Patient destination: Home Follow Up Recommendations: 24 hour supervision/assistance Equipment Recommended: To be determined   Skilled Therapeutic Intervention Skilled OT session completed with focus on initial evaluation, pt/family education on OT role/POC, and establishment of patient-centered goals.   Pt greeted supine in bed with daughter present. Per daughter, pt will have 24/7 supervision/physical assist with hired caregivers at d/c. Pt unable to provide this information himself, or reliable PLOF information due to cognitive deficits. Pt engaged in B/D bedlevel with overall Total A. Pt blind and HOH, able to follow 1 step instructions with extra time and [facility] guidance for locating bedrails/ADL items. Max A-Total A rolling to R>L for pericare and LB dressing. Pt requiring assist for LE positioning throughout tx, often exhibited leaning tendencies to Rt side with HOB  elevated. Required cues to acknowledge and Max A to correct (though pt motivated to help by pulling with bedrail). UB self care completed EOB with pt requiring Total A for maintaining static sitting balance. Pt able to wash most of UB with instruction however required Total A for donning overhead shirt. Apraxia noted when pt was handed deodorant; told him what it was and instructed him to uncap it. He uncapped it, stopped, then recapped it and handed it back to OT. At end of tx pt was returned to supine. Pt repositioned in bed with 2 helpers. He was left with all needs and NT present at time of departure.   ACE wraps donned post LB bathing.  BP recorded at start and end of tx, please see flowsheet for details. No c/o dizziness at EOB.  OT Evaluation Precautions/Restrictions  Precautions Precautions: Fall Precaution Comments: watch BP; BLE wraps Required Braces or Orthoses: Other Brace/Splint Other Brace/Splint: Bilateral PRAFOs for in bed Restrictions Weight Bearing Restrictions: No General Chart Reviewed: Yes Family/Caregiver Present: Yes (Daughter) Vital Signs   Pain No c/o pain during tx    Home Living/Prior Functioning Home Living Family/patient expects to be discharged to:: Private residence Living Arrangements: Spouse/significant other, Children Available Help at Discharge: Family, Available 24 hours/day, Personal care attendant Type of Home: House Home Access: Stairs to enter [employer] of Steps: 8-10 from front of house Home Layout: Two level, Laundry or work area in basement Alternate Therapist, sports of Steps: flight Bathroom Shower/Tub: Public librarian, Architectural technologist: Standard Additional Comments:  (Due to pt being poor historian, PLOF information obtained from rehab admission documentation and daughter. )  Lives With: Spouse IADL History Homemaking Responsibilities: No Current License: No Leisure and Hobbies: Listening to audiobooks Prior  Function Level of Independence: Independent with transfers, Needs assistance with homemaking, Independent with basic ADLs  Able to Take Stairs?: Yes (has a stair lift so generally avoided them) Comments: Does not drive due to visual impairments; Helps out around the house some (loading/unloading dishwasher, taking out trash, etc) ADL ADL ADL Comments: Please see functional navigator for ADL status Vision Baseline Vision/History: Glaucoma;Legally blind Patient Visual Report: No change from baseline Perception  Perception: Impaired Praxis Praxis: Impaired Cognition Overall Cognitive Status: Impaired/Different from baseline Arousal/Alertness: Lethargic Orientation Level: Person;Place Year: 2018 Month: June Day of Week: Correct Memory: Impaired Immediate Memory Recall: Sock;Blue;Bed Memory Recall: Sock;Bed Memory Recall Sock: With Cue Memory Recall Bed: With Cue Attention: Sustained Sustained Attention: Impaired Awareness: Impaired Problem Solving: Impaired Safety/Judgment: Impaired Comments: Conversation disjointed and tangential;  (HOH) Sensation Sensation Light Touch: Appears Intact (bilateral UEs) Stereognosis: Not tested Hot/Cold: Not tested Proprioception: Impaired Detail Proprioception Impaired Details: Impaired RUE;Impaired LUE Coordination Gross Motor Movements are Fluid and Coordinated: No Fine Motor Movements are Fluid and Coordinated: No Coordination and Movement Description: Affected by general weakness, blindness, and cognition  Motor  Motor Motor: Paraplegia;Abnormal tone;Abnormal postural alignment and control Motor - Skilled Clinical Observations: Total A to maintain static sitting balance EOB with bilateral UE support Mobility  Bed Mobility Bed Mobility: Rolling Right;Rolling Left Rolling Left: 2: Max assist Rolling Left Details: Tactile cues for initiation;Verbal cues for technique;Verbal cues for precautions/safety;Verbal cues for sequencing;Manual  facilitation for placement;Manual facilitation for weight bearing;Manual facilitation for weight shifting Transfers Transfers: Not assessed  Trunk/Postural Assessment  Cervical Assessment Cervical Assessment: Within Functional Limits Thoracic Assessment Thoracic Assessment: Exceptions to [facility] (kyphosis) Lumbar Assessment Lumbar Assessment: Exceptions to [facility] (posterior pelvic tilt) Postural Control Postural Control: Deficits on evaluation Righting  Reactions: impaired  (when recovering from balance loses during UB self care EOB)  Balance Balance Balance Assessed: Yes Static Sitting Balance Static Sitting - Level of Assistance: 1: +1 Total assist Extremity/Trunk Assessment RUE Assessment RUE Assessment: Exceptions to [facility] (3+/5 grossly) LUE Assessment LUE Assessment: Exceptions to [facility] (3+/5 grossly)   See Function Navigator for Current Functional Status.   Refer to Care Plan for Long Term Goals  Recommendations for other services: Other: SLP   Discharge Criteria: Patient will be discharged from OT if patient refuses treatment 3 consecutive times without medical reason, if treatment goals not met, if there is a change in medical status, if patient makes no progress towards goals or if patient is discharged from hospital.  The above assessment, treatment plan, treatment alternatives and goals were discussed and mutually agreed upon: by patient and by family  Skeet Simmer 07/11/2016, 5:09 PM

## 2016-07-11 NOTE — Progress Notes (Signed)
Subjective/Complaints:   Objective: Vital Signs: Blood pressure (!) 142/62, pulse 66, temperature 98 F (36.7 C), temperature source Oral, resp. rate 18, weight 85.4 kg (188 lb 3.2 oz), SpO2 100 %. Dg Abd Portable 1v  Result Date: 07/12/2016 CLINICAL DATA:  Abdominal pain and distention. EXAM: PORTABLE ABDOMEN - 1 VIEW COMPARISON:  CT scan July 08, 2016 FINDINGS: Dilated small bowel loops are unchanged since July 08, 2016 suggesting a partial or early small bowel obstruction. IMPRESSION: No change in the early or partial small bowel obstruction. Electronically Signed   By: Dorise Bullion III M.D   On: 07/12/2016 15:11   Results for orders placed or performed during the hospital encounter of 07/15/2016 (from the past 72 hour(s))  Comprehensive metabolic panel     Status: Abnormal   Collection Time: 07/11/16  6:19 AM  Result Value Ref Range   Sodium 141 135 - 145 mmol/L   Potassium 4.8 3.5 - 5.1 mmol/L   Chloride 116 (H) 101 - 111 mmol/L   CO2 17 (L) 22 - 32 mmol/L   Glucose, Bld 107 (H) 65 - 99 mg/dL   BUN 43 (H) 6 - 20 mg/dL   Creatinine, Ser 0.86 0.61 - 1.24 mg/dL   Calcium 8.5 (L) 8.9 - 10.3 mg/dL   Total Protein 4.7 (L) 6.5 - 8.1 g/dL   Albumin 2.2 (L) 3.5 - 5.0 g/dL   AST 72 (H) 15 - 41 U/L   ALT 67 (H) 17 - 63 U/L   Alkaline Phosphatase 105 38 - 126 U/L   Total Bilirubin 0.8 0.3 - 1.2 mg/dL   GFR calc non Af Amer >60 >60 mL/min   GFR calc Af Amer >60 >60 mL/min    Comment: (NOTE) The eGFR has been calculated using the CKD EPI equation. This calculation has not been validated in all clinical situations. eGFR's persistently <60 mL/min signify possible Chronic Kidney Disease.    Anion gap 8 5 - 15  CBC with Differential/Platelet     Status: Abnormal   Collection Time: 07/11/16  7:24 AM  Result Value Ref Range   WBC 16.5 (H) 4.0 - 10.5 K/uL   RBC 3.27 (L) 4.22 - 5.81 MIL/uL   Hemoglobin 9.5 (L) 13.0 - 17.0 g/dL   HCT 29.5 (L) 39.0 - 52.0 %   MCV 90.2 78.0 - 100.0 fL   MCH 29.1 26.0 - 34.0 pg   MCHC 32.2 30.0 - 36.0 g/dL   RDW 16.7 (H) 11.5 - 15.5 %   Platelets 367 150 - 400 K/uL   Neutrophils Relative % 82 %   Neutro Abs 13.6 (H) 1.7 - 7.7 K/uL   Lymphocytes Relative 8 %   Lymphs Abs 1.2 0.7 - 4.0 K/uL   Monocytes Relative 10 %   Monocytes Absolute 1.7 (H) 0.1 - 1.0 K/uL   Eosinophils Relative 0 %   Eosinophils Absolute 0.0 0.0 - 0.7 K/uL   Basophils Relative 0 %   Basophils Absolute 0.0 0.0 - 0.1 K/uL     HEENT: normal Cardio: RRR and No murmurs Resp: CTA B/L and Unlabored GI: BS positive and Nontender, nondistended Extremity:  Edema Bilateral 1 plus pretibial and pedal edema Skin:   Other MASD Neuro: Alert/Oriented, Abnormal Sensory Absent sensation to light touch and proprioception bilateral lower limbs and Abnormal Motor 0/5 strength bilateral hip flexor, knee extensor, ankle dorsiflexor, 5/5 bilateral deltoid, bicep, tricep, grip Musc/Skel:  Other No pain with lower extremity range of motion, negative straight leg raise Gen. no  acute distress   Assessment/Plan: 1. Functional deficits secondary to paraplegia which require 3+ hours per day of interdisciplinary therapy in a comprehensive inpatient rehab setting. Physiatrist is providing close team supervision and 24 hour management of active medical problems listed below. Physiatrist and rehab team continue to assess barriers to discharge/monitor patient progress toward functional and medical goals. FIM:                   Function - Comprehension Comprehension: Auditory Comprehension assist level: Understands basic 50 - 74% of the time/ requires cueing 25 - 49% of the time  Function - Expression Expression: Verbal Expression assist level: Expresses basic 75 - 89% of the time/requires cueing 10 - 24% of the time. Needs helper to occlude trach/needs to repeat words.  Function - Social Interaction Social Interaction assist level: Interacts appropriately 50 - 74% of the time -  May be physically or verbally inappropriate.  Function - Problem Solving Problem solving assist level: Solves basic 50 - 74% of the time/requires cueing 25 - 49% of the time  Function - Memory Memory assist level: Recognizes or recalls 50 - 74% of the time/requires cueing 25 - 49% of the time Patient normally able to recall (first 3 days only): That he or she is in a hospital  Medical Problem List and Plan: 1. 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 PE/Right peroneal DVT/Anticoagulation: Pharmaceutical: Other (comment)--Eliquis no sign of hemorrhage 3. Pain Management: tylenol prn.  4. Mood: LCSW to follow for evaluation and support.  5. Neuropsych: This patient is not capable of making decisions on hisown behalf. 6. Skin/Wound Care: Air mattress for pressure relief and turn frequently to help manage MASD. Will order PRAFO's for BLE. Add protein supplement due to low albumin levels and to promote healing.  7. Fluids/Electrolytes/Nutrition: Monitor I/O. Check lytes in am.  8. Metastatic thoracic cancer: Will add abdominal binder and ACE wrap BLE to support BP. Continue trial of steroids.  9. Hematuria: Monitor H/H serially and urine output. Resume CBI prn clots or discomfort. I would expect some microscopic hematuria, this may be source of anemia 10. 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  11. Delirium: Supportive care--monitor as cognition has worsened on steroids (per daughter). Decadron discontinued, as discussed with daughter unlikely to provide much improvement with paraplegia 12. B 12 deficiency: Now on B 12 injections for supplement 13. AKI: Avoid hypotension and nephrotoxic medications. SCr improved from 2.61-->1.3 range with IVF for hydration--GFR normalized, however, BUN still elevated, continue IV fluids 14. Leucocytosis: Likely reactive--monitor for now.  15. Mild paralytic ileus, start Reglan,  liquid diet for today  16. Anemia, likely acute blood loss plus minus B12 deficiency LOS (Days) 1 A FACE TO FACE EVALUATION WAS PERFORMED  [name],ANDREW E 07/11/2016, 10:57 AM

## 2016-07-12 ENCOUNTER — Inpatient Hospital Stay (HOSPITAL_COMMUNITY): Payer: Medicare Other | Admitting: Speech Pathology

## 2016-07-12 ENCOUNTER — Inpatient Hospital Stay (HOSPITAL_COMMUNITY): Payer: Medicare Other | Admitting: Physical Therapy

## 2016-07-12 ENCOUNTER — Inpatient Hospital Stay (HOSPITAL_COMMUNITY): Payer: Medicare Other

## 2016-07-12 LAB — BASIC METABOLIC PANEL
Anion gap: 4 — ABNORMAL LOW (ref 5–15)
BUN: 46 mg/dL — AB (ref 6–20)
CHLORIDE: 113 mmol/L — AB (ref 101–111)
CO2: 24 mmol/L (ref 22–32)
Calcium: 8.5 mg/dL — ABNORMAL LOW (ref 8.9–10.3)
Creatinine, Ser: 0.86 mg/dL (ref 0.61–1.24)
GFR calc Af Amer: >60 mL/min (ref >60)
GFR calc non Af Amer: >60 mL/min (ref >60)
GLUCOSE: 76 mg/dL (ref 65–99)
POTASSIUM: 4.7 mmol/L (ref 3.5–5.1)
Sodium: 141 mmol/L (ref 135–145)

## 2016-07-12 LAB — URINALYSIS, ROUTINE W REFLEX MICROSCOPIC
Bilirubin Urine: NEGATIVE
GLUCOSE, UA: NEGATIVE mg/dL
Ketones, ur: NEGATIVE mg/dL
NITRITE: NEGATIVE
PH: 5 (ref 5.0–8.0)
Protein, ur: 30 mg/dL — AB
SPECIFIC GRAVITY, URINE: 1.02 (ref 1.005–1.030)

## 2016-07-12 MED ORDER — ENSURE ENLIVE PO LIQD: 237.0000 mL | Freq: Two times a day (BID) | ORAL | Status: DC

## 2016-07-12 NOTE — Progress Notes (Signed)
Initial Nutrition Assessment  DOCUMENTATION CODES:   Not applicable  INTERVENTION:    Ensure Enlive po BID, each supplement provides 350 kcal and 20 grams of protein  NUTRITION DIAGNOSIS:   Inadequate oral intake related to lethargy/confusion as evidenced by meal completion < 50%  GOAL:   Patient will meet greater than or equal to 90% of their needs  MONITOR:   PO intake, Supplement acceptance, Labs, Weight trends, Skin, I & O's  REASON FOR ASSESSMENT:   Malnutrition Screening Tool  ASSESSMENT:    83 year old Male with history of HTN, CAD s/p CABG, legally blind, prostate cancer, melanoma, colon cancer who was admitted on 07/03/16 with BLE weakness with numbness, inability to walk, slurred speech as well as back and chest pain.  Pt sleepy and confused upon RD visit. No family at bedside. PO intake variable at 10-50% per flowsheet records. Opened Ensure Enlive supplement on tray table. Medications reviewed and include Vit D. Labs reviewed. BUN 46 (H).  Limited Nutrition Focused Physical Exam completed to upper body. No muscle or subcutaneous fat depletion noticed.  Diet Order:  Diet regular Room service appropriate? Yes; Fluid consistency: Thin  Skin:  Wound (see comment) (Stage I to buttocks)  Last BM:  6/23  Height:   Ht Readings from Last 1 Encounters:  07/03/16 5\' 10"  (1.778 m)   Weight:   Wt Readings from Last 1 Encounters:  07/12/16 183 lb 13 oz (83.4 kg)   Ideal Body Weight:  75.4 kg  BMI:  Body mass index is 26.37 kg/m.  Estimated Nutritional Needs:   Kcal:  1800-2000  Protein:  90-100 gm  Fluid:  1.8-2.0 L  EDUCATION NEEDS:   No education needs identified at this time  Arthur Holms, RD, LDN Pager #: [phone] After-Hours Pager #: [phone]

## 2016-07-12 NOTE — Progress Notes (Signed)
Occupational Therapy Session Note  Patient Details  Name: Luis Pierce MRN: [id number] Date of Birth: 1922-07-06  Today's Date: 07/12/2016 OT Individual Time: 1100-1155 OT Individual Time Calculation (min): 55 min    Short Term Goals: Week 1:  OT Short Term Goal 1 (Week 1): Pt will complete 1/4 components of UB dressing in supported sitting OT Short Term Goal 2 (Week 1): Pt will engage in 2 functional tasks EOB or EOM with Max A for 5 minutes OT Short Term Goal 3 (Week 1): Pt will be able to identify 1 grooming item via tactile discrimination and initiate its appropriate use   Skilled Therapeutic Interventions/Progress Updates:    Pt resting in bed upon arrival.  Focus on bed mobility, sitting balance, and initiation of dressing tasks while seated in w/c.  Pt required max X 2 for bed mobility to don pants.  Pt transferred to w/c via Maxi-Move.  Pt initiated washing face and donning shirt with max tactile cues for orientation of shirt.  Pt remained in w/c with soft call bell placed on chest.  Pt demonstrated ability to activate call bell and verbalized understanding of use of bell.    Therapy Documentation Precautions:  Precautions Precautions: Fall Precaution Comments: watch BP; BLE wraps Required Braces or Orthoses: Other Brace/Splint Other Brace/Splint: Bilateral PRAFOs for in bed Restrictions Weight Bearing Restrictions: No Pain:  Pt denied pain  See Function Navigator for Current Functional Status.   Therapy/Group: Individual Therapy  Leroy Libman 07/12/2016, 12:13 PM

## 2016-07-12 NOTE — Progress Notes (Signed)
Physical Therapy Session Note  Patient Details  Name: Luis Pierce MRN: [id number] Date of Birth: [birth year]  Today's Date: 07/12/2016 PT Individual Time: 1300-1350 PT Individual Time Calculation (min): 50 min   Short Term Goals: Week 1:  PT Short Term Goal 1 (Week 1): Pt will be able to initiate slideboard transfer with total +2 assist PT Short Term Goal 2 (Week 1): Pt will be able to demonstrate sitting balance edge of mat with max assist functionally PT Short Term Goal 3 (Week 1): Pt will be able to verbalize sequencing for rolling in the bed  Skilled Therapeutic Interventions/Progress Updates:  Pt received seated reclined in tilt in space w/c. Pt total A to rehab gym. Slide board transfer from w/c>mat with total A+2; pt assisted with L UE slightly but seemed disoriented to task. Pt not oriented to time or person, reporting he is 83 years old and the year is 79. Pt required min/mod A to maintain static sitting balance on mat with verbal cues for anterior lean; pt with decreased attention to task of finding midline. Pt reported feeling dizzy and lightheaded, requiring to be leaned back to rest on student therapist. BP measure to be 99/64 while pt semi-reclined. Total A+2 slide board transfer from mat>w/c. Pt total A back to pt's room in tilt in space w/c. Maximove +2A to return pt to bed; +2 A rolling R/L in bed. Pt left supine in bed with all needs met. 25 minutes missed PT time due to patient fatigue and pt politely requesting to return to bed.  Therapy Documentation Precautions:  Precautions Precautions: Fall Precaution Comments: watch BP; BLE wraps Required Braces or Orthoses: Other Brace/Splint Other Brace/Splint: Bilateral PRAFOs for in bed Restrictions Weight Bearing Restrictions: No General: PT Amount of Missed Time (min): 25 Minutes PT Missed Treatment Reason: Patient fatigue   See Function Navigator for Current Functional Status.   Therapy/Group: Individual  Therapy  Alysia Penna 07/12/2016, 2:12 PM

## 2016-07-12 NOTE — Evaluation (Signed)
Speech Language Pathology Assessment and Plan  Patient Details  Name: Luis Pierce MRN: [id number] Date of Birth: 03/26/1922  SLP evaluation only  Today's Date: 07/12/2016 SLP Individual Time: 0730-0830 SLP Individual Time Calculation (min): 60 min   Problem List:  Patient Active Problem List   Diagnosis Date Noted  . Neurogenic bowel 07/11/2016  . Neurogenic urinary bladder disorder 07/11/2016  . Metastatic cancer to spine ([place]) 07/09/2016  . History of cancer   . Paraplegia at T4 level ([place])   . Spinal cord compression ([place])   . Embolic stroke ([place]) 59/56/3875  . Pressure injury of skin 07/07/2016  . DVT (deep vein thrombosis) in pregnancy ([place])   . ARF (acute renal failure) ([place]) 07/05/2016  . Leg weakness   . Dehydration 07/04/2016  . 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 Urinary incontinence, male, stress 07/04/2016  . Acute lower UTI 07/04/2016  . B12 deficiency 07/04/2016  . Pulmonary embolism on right ([place])   . Thoracic back pain   . Pulmonary embolism ([place]) 07/03/2016  . Generalized weakness 07/03/2016  . Hypertension 07/03/2016  . Hyperlipidemia 07/03/2016  . Gross hematuria 07/03/2016  . Bilateral leg weakness 07/03/2016  . Hypothermia 07/03/2016   Past Medical History:  Past Medical History:  Diagnosis Date  . Blind left eye    retinal detached retina  . 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 Chronic kidney disease    hx kidney stones and urinary incontinence  . Colon cancer ([place])   . Coronary artery disease   . Diverticulosis   . DJD (degenerative joint disease)   . Glaucoma   . Hyperlipemia   . Hypertension   . Lipoma of back   . Melanoma of back ([place])   . Mild mitral regurgitation   . Prostate cancer ([place])   . Retinal detachment   . Urinary incontinence   . Urinary incontinence, male, stress 07/04/2016   Past Surgical History:  Past Surgical History:  Procedure Laterality Date  . APPENDECTOMY    . CARDIAC CATHETERIZATION    .  CHOLECYSTECTOMY    . CORONARY ARTERY BYPASS GRAFT  1982   1 vessel  . CYSTOSCOPY  09/21/2011   Procedure: CYSTOSCOPY;  Surgeon: Reece Packer, MD;  Location: [facility];  Service: Urology;  Laterality: N/A;  . EYE SURGERY     detached retinas bilateral,bil.cataract  . TONSILLECTOMY     as child  . URINARY SPHINCTER IMPLANT  09/21/2011   Procedure: ARTIFICIAL URINARY SPHINCTER;  Surgeon: Reece Packer, MD;  Location: [facility];  Service: Urology;  Laterality: N/A;    Assessment / Plan / Recommendation Clinical Impression [name] is a 83 year old male with history of HTN, CAD s/p CABG, legally blind, prostate cancer, melanoma, colon cancer who was admitted on 07/03/16 with BLE weakness with numbness, inability to walk, slurred speech as well as back and chest pain. He was found to have UTI with hematuria and started on rocephin as well as CBI per [name]. CTA chest showed right PE and IV heparin started. He has had issues with confusion as well as worsening of renal status. [name] consulted and felt that this was due to ATN from hypotension and NSAIDs use. Renal ultrasound with cortical thinning and no hydronephrosis. MRI brain with acute small left temporal lobe/hippocampal infarct. [name] felt that stroke embolic due to paradoxical emboli from RLE DVT or hypercoagulable state. 2 D echo with EF 60- 65% and no SOE. Heparin changed to low  dose eliquis due to ongoing hematuria. BC/UCS negative and rocephin discontinued 6/19. CT abdomen pelvis done due to ongoing hematuria and showed osseous metastatic lesions left pelvis-increased from prior exam, 1 cm non obstructive renal calculi with 4.5 X 2.9 cm irregular bladder mass (slightly enlarged) concerning for invasive recurrent prostate cancer as well as moderate small bowel distension concerning for partial SBO. MRI spine showed multiple osseous lesion T3, T4, T9 and T11 with extraosseous tumor extenison at T3 level with severe central canal  stenosis and mass effect on spinal cord, abnormal signal from T2-T4 mid body, soft tissue signal from T2 to above suspected mild thoracic sprain and moderate to sever canal stenosis C3-4--limited study as patient refused contrast. [name] consulted and recommended trial of IV decadron for a few days--bracing prn for comfort. CBI discontinued as bleeding likely due to bladder lesion and has been transfused with one unit PRBC 6/22 for drop in H/H. Therapy ongoing with BLE wrapped to help support BP. CIR recommended for follow up therapy with admission to CIR on 06/28/2016.   Comprehensive bedside swallow and cognitive linguistic evaluations completed on 07/12/16. Pt is at mild risk for apsiriaton when following general aspiraiton precautions on regular diet with thin liquids largely d/t visual deficits. Pt didn't demonstrate any overt s/s of aspiration. Recommend continuing regular diet with thin liquids, medicine whole in thin liquids with nursing to assist pt with set-up of tray. As mentioned above, pt has significant visual deficits which is further complicated by severe hearing loss. Even with amplification, pt unable to understand much of what is said. As a result, he received a score of 10 our of 22 on the [place] Blind (n=>18). Results are felt to be reduced by severe hearing and visual impairments. For example, pt never understood SLP to say "bicycle" - he continually stated "basketful." Pt's basic cognitive function appears to be functional for age and appropriate for environment. Pt is able to sustained attention, perform basic problem solving such as self-feeding, utilize call light and express wants and needs. This Probation officer contacted pt's daughter for further informaiton regarding baseline abilities. Daughter provides that pt is able ot perform mental math with her and agrees with recommendation to discharge ST orders. All education completed.    Skilled Therapeutic Interventions          Skilled treatment  session focused on completion of bedside swallow and cognitive linguistic evaluations, see above. Education provided to daughter on impact that severe hearing and visual deficits have on pt's ability to perform tasks independently. Daughter provides that pt is at baseline level of cognitive function given unfamiliar environment, hearing and visual impairments. All questions answered to pt and daughter's satisfaction. No further services are indicated at this time.    SLP Assessment  Patient does not need any further Speech Lanaguage Pathology Services    Recommendations  SLP Diet Recommendations: Age appropriate regular solids;Thin Liquid Administration via: Cup;Straw Medication Administration: Whole meds with liquid Supervision: Staff to assist with self feeding (d/t visual deficits) Compensations: Minimize environmental distractions Postural Changes and/or Swallow Maneuvers: Seated upright 90 degrees Oral Care Recommendations: Oral care BID Patient destination: Home Follow up Recommendations: None Equipment Recommended: None recommended by SLP                     Pain    Prior Functioning Cognitive/Linguistic Baseline: Baseline deficits Baseline deficit details: Pt with decreased ability to perform higher level task d/t hearing and vision impairments Type of Home: House  Lives With: Spouse Available Help at Discharge: Family;Available 24 hours/day;Personal care attendant Vocation: Retired  Function:  Eating Eating   Modified Consistency Diet: No Eating Assist Level: Set up assist for   Eating Set Up Assist For: Opening containers;Cutting food       Cognition Comprehension Comprehension assist level: Understands basic 75 - 89% of the time/ requires cueing 10 - 24% of the time (Very hearing impaired - cognitively he can understand more information)  Expression   Expression assist level: Expresses basic 75 - 89% of the time/requires cueing 10 - 24% of the time. Needs  helper to occlude trach/needs to repeat words.  Social Interaction Social Interaction assist level: Interacts appropriately 90% of the time - Needs monitoring or encouragement for participation or interaction.  Problem Solving Problem solving assist level: Solves basic 50 - 74% of the time/requires cueing 25 - 49% of the time (Impacted by severe visual and hearing deficits)  Memory Memory assist level: Recognizes or recalls 50 - 74% of the time/requires cueing 25 - 49% of the time (Complicated by severe hearing and visual impairments)   Short Term Goals: No short term goals set  Refer to Care Plan for Long Term Goals  Recommendations for other services: None   Discharge Criteria: Patient will be discharged from SLP if patient refuses treatment 3 consecutive times without medical reason, if treatment goals not met, if there is a change in medical status, if patient makes no progress towards goals or if patient is discharged from hospital.  The above assessment, treatment plan, treatment alternatives and goals were discussed and mutually agreed upon: by patient and by family   Dove Gresham B. Rutherford Nail, M.S., [place] 07/12/2016, 9:11 AM

## 2016-07-13 ENCOUNTER — Inpatient Hospital Stay (HOSPITAL_COMMUNITY): Payer: Medicare Other | Admitting: Occupational Therapy

## 2016-07-13 ENCOUNTER — Inpatient Hospital Stay (HOSPITAL_COMMUNITY): Payer: Medicare Other | Admitting: Physical Therapy

## 2016-07-13 ENCOUNTER — Inpatient Hospital Stay (HOSPITAL_COMMUNITY): Payer: Medicare Other | Admitting: *Deleted

## 2016-07-13 DIAGNOSIS — I2699 Other pulmonary embolism without acute cor pulmonale: Secondary | ICD-10-CM

## 2016-07-13 DIAGNOSIS — K592 Neurogenic bowel, not elsewhere classified: Secondary | ICD-10-CM

## 2016-07-13 DIAGNOSIS — N319 Neuromuscular dysfunction of bladder, unspecified: Secondary | ICD-10-CM

## 2016-07-13 LAB — URINE CULTURE: CULTURE: NO GROWTH

## 2016-07-13 MED ORDER — ENSURE ENLIVE PO LIQD
237.0000 mL | Freq: Three times a day (TID) | ORAL | Status: DC
  Administered 2016-07-13 – 2016-07-15 (×3): 237 mL via ORAL

## 2016-07-13 MED ORDER — POTASSIUM CHLORIDE 2 MEQ/ML IV SOLN
INTRAVENOUS | Status: DC
  Administered 2016-07-13: 19:00:00 via INTRAVENOUS
  Filled 2016-07-13 (×3): qty 1000

## 2016-07-13 NOTE — Progress Notes (Signed)
Occupational Therapy Session Note  Patient Details  Name: Luis Pierce MRN: [id number] Date of Birth: December 31, 1922  Today's Date: 07/13/2016 OT Individual Time: 1100-1200 OT Individual Time Calculation (min): 60 min    Short Term Goals: Week 1:  OT Short Term Goal 1 (Week 1): Pt will complete 1/4 components of UB dressing in supported sitting OT Short Term Goal 2 (Week 1): Pt will engage in 2 functional tasks EOB or EOM with Max A for 5 minutes OT Short Term Goal 3 (Week 1): Pt will be able to identify 1 grooming item via tactile discrimination and initiate its appropriate use   Skilled Therapeutic Interventions/Progress Updates:    Pt resting in w/c upon arrival.  Pt engaged in donning shirt with multimodal cues and assistance to place over head and pull over trunk.  Pt engaged in sitting balance activities while seated in w/c.  Pt able to flex trunk with min A for balance.  Attempted lateral leans but pt required max A to maintain balance and unable to return to neutral.  Pt O2 sats dropped to 74% and RN notified.  RN placed pt on 2L O2 and pt's O2 rebounded to >90%. Pt did not appear in distress except for shortness of breath.  BP at 122/59. Pt remained in w/c with RN present.   Therapy Documentation Precautions:  Precautions Precautions: Fall Precaution Comments: watch BP; BLE wraps Required Braces or Orthoses: Other Brace/Splint Other Brace/Splint: Bilateral PRAFOs for in bed Restrictions Weight Bearing Restrictions: No   Pain:  Pt denies pain  See Function Navigator for Current Functional Status.   Therapy/Group: Individual Therapy  Leroy Libman 07/13/2016, 12:24 PM

## 2016-07-13 NOTE — Progress Notes (Signed)
Recreational Therapy Session Note  Patient Details  Name: Luis Pierce MRN: [id number] Date of Birth: [birth year] Today's Date: 07/13/2016  TR eval deferred as pt is not appropriate for TR services.  [place] 07/13/2016, 3:47 PM

## 2016-07-13 NOTE — Progress Notes (Signed)
Patient refusing to take his night time meds. Stated he just wants to be left alone and that he wants to go home or just die. Encouragement given to patient. Called daughter regarding his refusal to take his meds. RN later on was able to give Eliquis 2.5 mg only. Patient drank couple sips of soda and just wanted to sleep. At 220 am, patient asked for some food to eat and he ate about 30% of his meals. Drank some more soda and said he felt better.

## 2016-07-13 NOTE — Progress Notes (Signed)
Occupational Therapy Session Note  Patient Details  Name: Luis Pierce MRN: [id number] Date of Birth: [birth year]  Today's Date: 07/13/2016 OT Individual Time: 1315-1400 OT Individual Time Calculation (min): 45 min    Short Term Goals: Week 1:  OT Short Term Goal 1 (Week 1): Pt will complete 1/4 components of UB dressing in supported sitting OT Short Term Goal 2 (Week 1): Pt will engage in 2 functional tasks EOB or EOM with Max A for 5 minutes OT Short Term Goal 3 (Week 1): Pt will be able to identify 1 grooming item via tactile discrimination and initiate its appropriate use   Skilled Therapeutic Interventions/Progress Updates:    1:1 Pt requesting to return to bed to rest and not to participate in any more activity due to fatigue. Trial of use of manual hoyer lift with large sling in prep for family education. Able to place sling with one person with pt assisting in forward weight shifting with pulling self forward. Pt asleep as soon as he was returned to the bed.   Therapy Documentation Precautions:  Precautions Precautions: Fall Precaution Comments: watch BP; BLE wraps Required Braces or Orthoses: Other Brace/Splint Other Brace/Splint: Bilateral PRAFOs for in bed Restrictions Weight Bearing Restrictions: No General: General OT Amount of Missed Time: 30 Minutes- despite encouargment for out of bed activity Pain:  no c/ o pain  ADL: ADL ADL Comments: Please see functional navigator for ADL status  See Function Navigator for Current Functional Status.   Therapy/Group: Individual Therapy  Willeen Cass [facility] 07/13/2016, 3:30 PM

## 2016-07-13 NOTE — Progress Notes (Signed)
Refused night bath.

## 2016-07-13 NOTE — IPOC Note (Signed)
Overall Plan of Care [facility]) Patient Details Name: Luis Pierce MRN: [id number] DOB: 08-11-1922  Admitting Diagnosis: PE  Hospital Problems: Principal Problem:   Paraplegia at T4 level [facility]) Active Problems:   Pulmonary embolism ([place])   Metastatic cancer to spine [facility])   Neurogenic bowel   Neurogenic urinary bladder disorder     Functional Problem List: Nursing Edema, Safety, Skin Integrity  PT Balance, Edema, Endurance, Motor, Pain, Perception, Safety, Sensory, Skin Integrity  OT Balance, Safety, Sensory, Cognition, Skin Integrity, Endurance, Motor, Pain, Vision, Perception  SLP    TR         Basic ADL's: OT Eating, Grooming, Bathing, Dressing, Toileting     Advanced  ADL's: OT Simple Meal Preparation     Transfers: PT Bed Mobility, Bed to Chair, Car, Manufacturing systems engineer, Metallurgist: PT Ambulation, Emergency planning/management officer, Stairs     Additional Impairments: OT    SLP None      TR      Anticipated Outcomes Item Anticipated Outcome  Self Feeding Mod A  Swallowing      Basic self-care  Mod A UB, Max A LB  Toileting  N/A, anticipate pt will still require Total A   Bathroom Transfers N/A, anticipate pt will still require Total A  Bowel/Bladder  pt will not be constipated during stay.  Transfers  mod assist bed mobility; max assist transfers; likely will require hoyer lift for caregivers  Locomotion  min assist w/c mobility for strengthening with cues due to visual deficits; total A functionally  Communication     Cognition     Pain  pt pain <3 or below  Safety/Judgment  Pt will remain safe and free from falls   Therapy Plan: PT Intensity: Minimum of 1-2 x/day ,45 to 90 minutes PT Frequency: 5 out of 7 days PT Duration Estimated Length of Stay: 21-24 days OT Intensity: Minimum of 1-2 x/day, 45 to 90 minutes OT Frequency: 5 out of 7 days OT Duration/Estimated Length of Stay: 21-24 days         Team Interventions: Nursing  Interventions Patient/Family Education, Skin Care/Wound Management, Discharge Planning, Psychosocial Support, Bladder Management  PT interventions Balance/vestibular training, Cognitive remediation/compensation, Discharge planning, Disease management/prevention, DME/adaptive equipment instruction, Functional mobility training, Neuromuscular re-education, Pain management, Patient/family education, Psychosocial support, Skin care/wound management, Splinting/orthotics, Therapeutic Activities, Therapeutic Exercise, UE/LE Strength taining/ROM, UE/LE Coordination activities, Visual/perceptual remediation/compensation, Wheelchair propulsion/positioning  OT Interventions Balance/vestibular training, Cognitive remediation/compensation, Discharge planning, Self Care/advanced ADL retraining, Therapeutic Activities, Therapeutic Exercise, Patient/family education, Functional mobility training, DME/adaptive equipment instruction, Neuromuscular re-education, Psychosocial support, UE/LE Strength taining/ROM, Wheelchair propulsion/positioning  SLP Interventions    TR Interventions    SW/CM Interventions Discharge Planning, Psychosocial Support, Patient/Family Education    Team Discharge Planning: Destination: PT-Home ,OT- Home , SLP-Home Projected Follow-up: PT-Home health PT, 24 hour supervision/assistance, OT-  24 hour supervision/assistance, SLP-None Projected Equipment Needs: PT-Wheelchair (measurements), Wheelchair cushion (measurements), Other (comment), Sliding board (may require hoyer), OT- To be determined, SLP-None recommended by SLP Equipment Details: PT- , OT-  Patient/family involved in discharge planning: PT- Patient,  OT-Patient, Family member/caregiver, SLP-Family member/caregiver  MD ELOS: 10-14d Medical Rehab Prognosis:  Guarded Assessment:  83 year old male with history of HTN, CAD s/p CABG, legally blind, prostate cancer, melanoma, colon cancer who was admitted on 07/03/16 with BLE weakness with  numbness, inability to walk, slurred speech as well as back and chest pain. He was found to have UTI with hematuria and  started on rocephin as well as CBI per [name]. CTA chest showed right PE and IV heparin started. He has had issues with confusion as well as worsening of renal status. [name] consulted and felt that this was due to ATN from hypotension and NSAIDs use. Renal ultrasound with cortical thinning and no hydronephrosis. MRI brain with acute small left temporal lobe/hippocampal infarct. [name] felt that stroke embolic due to paradoxical emboli from RLE DVT or hypercoagulable state. 2 D echo with EF 60- 65% and no SOE. Heparin changed to low dose eliquis due to ongoing hematuria   Now requiring 24/7 Rehab RN,MD, as well as CIR level PT, OT and SLP.  Treatment team will focus on ADLs and mobility with goals set at See Team Conference Notes for weekly updates to the plan of care

## 2016-07-13 NOTE — Progress Notes (Signed)
Physical Therapy Session Note  Patient Details  Name: Luis Pierce MRN: [id number] Date of Birth: 02-Jun-1922  Today's Date: 07/13/2016 PT Individual Time: 0930-1030 PT Individual Time Calculation (min): 60 min   Short Term Goals: Week 1:  PT Short Term Goal 1 (Week 1): Pt will be able to initiate slideboard transfer with total +2 assist PT Short Term Goal 2 (Week 1): Pt will be able to demonstrate sitting balance edge of mat with max assist functionally PT Short Term Goal 3 (Week 1): Pt will be able to verbalize sequencing for rolling in the bed  Skilled Therapeutic Interventions/Progress Updates:  Pt received supine in bed. Pt mod A rolling in order to don pants and place sling in preparation for transfer with maximove; verbal and tactile cues for initiation and sequencing; total A to don pants. Total A+2 transfer from w/c to tilt in space w/c using maximove for management of IV pole and lift. Pt educated on technique for propelling w/c; pt propelled reclined tilt in space w/c 50ft with mod A for steering, increased time, and ocassional rest breaks. Returned to pt's room total A. Pt engaged in anterior weight shifting in tilt in space w/c upright x5 and semi-reclined x5 with use of B UE and min A for initiation of weight shift, verbal cues for controlled posterior lean when returning to sit against back of chair. Pt more engaged during therapy session today and demonstrated improved ability to follow commands. Pt left seated reclined in tilt in space w/c with call bell in reach.   Therapy Documentation Precautions:  Precautions Precautions: Fall Precaution Comments: watch BP; BLE wraps Required Braces or Orthoses: Other Brace/Splint Other Brace/Splint: Bilateral PRAFOs for in bed Restrictions Weight Bearing Restrictions: No   See Function Navigator for Current Functional Status.   Therapy/Group: Individual Therapy  Alysia Penna 07/13/2016, 12:16 PM

## 2016-07-13 NOTE — Progress Notes (Signed)
Subjective/Complaints: Refused meds except for Eliquis last night.  Patient has no complaints, but states he doesn't want to do therapy today.  Review of systems negative for chest pain, shortness of breath, abdominal pain, nausea or vomiting. Objective: Vital Signs: Blood pressure 127/62, pulse 80, temperature 98.1 F (36.7 C), temperature source Oral, resp. rate 18, weight 83.4 kg (183 lb 13 oz), SpO2 98 %. No results found. Results for orders placed or performed during the hospital encounter of 06/18/2016 (from the past 72 hour(s))  Comprehensive metabolic panel     Status: Abnormal   Collection Time: 07/11/16  6:19 AM  Result Value Ref Range   Sodium 141 135 - 145 mmol/L   Potassium 4.8 3.5 - 5.1 mmol/L   Chloride 116 (H) 101 - 111 mmol/L   CO2 17 (L) 22 - 32 mmol/L   Glucose, Bld 107 (H) 65 - 99 mg/dL   BUN 43 (H) 6 - 20 mg/dL   Creatinine, Ser 0.86 0.61 - 1.24 mg/dL   Calcium 8.5 (L) 8.9 - 10.3 mg/dL   Total Protein 4.7 (L) 6.5 - 8.1 g/dL   Albumin 2.2 (L) 3.5 - 5.0 g/dL   AST 72 (H) 15 - 41 U/L   ALT 67 (H) 17 - 63 U/L   Alkaline Phosphatase 105 38 - 126 U/L   Total Bilirubin 0.8 0.3 - 1.2 mg/dL   GFR calc non Af Amer >60 >60 mL/min   GFR calc Af Amer >60 >60 mL/min    Comment: (NOTE) The eGFR has been calculated using the CKD EPI equation. This calculation has not been validated in all clinical situations. eGFR's persistently <60 mL/min signify possible Chronic Kidney Disease.    Anion gap 8 5 - 15  CBC with Differential/Platelet     Status: Abnormal   Collection Time: 07/11/16  7:24 AM  Result Value Ref Range   WBC 16.5 (H) 4.0 - 10.5 K/uL   RBC 3.27 (L) 4.22 - 5.81 MIL/uL   Hemoglobin 9.5 (L) 13.0 - 17.0 g/dL   HCT 29.5 (L) 39.0 - 52.0 %   MCV 90.2 78.0 - 100.0 fL   MCH 29.1 26.0 - 34.0 pg   MCHC 32.2 30.0 - 36.0 g/dL   RDW 16.7 (H) 11.5 - 15.5 %   Platelets 367 150 - 400 K/uL   Neutrophils Relative % 82 %   Neutro Abs 13.6 (H) 1.7 - 7.7 K/uL   Lymphocytes  Relative 8 %   Lymphs Abs 1.2 0.7 - 4.0 K/uL   Monocytes Relative 10 %   Monocytes Absolute 1.7 (H) 0.1 - 1.0 K/uL   Eosinophils Relative 0 %   Eosinophils Absolute 0.0 0.0 - 0.7 K/uL   Basophils Relative 0 %   Basophils Absolute 0.0 0.0 - 0.1 K/uL  Basic metabolic panel     Status: Abnormal   Collection Time: 07/12/16  5:29 AM  Result Value Ref Range   Sodium 141 135 - 145 mmol/L   Potassium 4.7 3.5 - 5.1 mmol/L   Chloride 113 (H) 101 - 111 mmol/L   CO2 24 22 - 32 mmol/L   Glucose, Bld 76 65 - 99 mg/dL   BUN 46 (H) 6 - 20 mg/dL   Creatinine, Ser 0.86 0.61 - 1.24 mg/dL   Calcium 8.5 (L) 8.9 - 10.3 mg/dL   GFR calc non Af Amer >60 >60 mL/min   GFR calc Af Amer >60 >60 mL/min    Comment: (NOTE) The eGFR has been calculated using the  CKD EPI equation. This calculation has not been validated in all clinical situations. eGFR's persistently <60 mL/min signify possible Chronic Kidney Disease.    Anion gap 4 (L) 5 - 15  Culture, Urine     Status: None   Collection Time: 07/12/16  8:53 AM  Result Value Ref Range   Specimen Description URINE, CLEAN CATCH    Special Requests NONE    Culture NO GROWTH    Report Status 07/13/2016 FINAL   Urinalysis, Routine w reflex microscopic     Status: Abnormal   Collection Time: 07/12/16  8:53 AM  Result Value Ref Range   Color, Urine YELLOW YELLOW   APPearance CLOUDY (A) CLEAR   Specific Gravity, Urine 1.020 1.005 - 1.030   pH 5.0 5.0 - 8.0   Glucose, UA NEGATIVE NEGATIVE mg/dL   Hgb urine dipstick LARGE (A) NEGATIVE   Bilirubin Urine NEGATIVE NEGATIVE   Ketones, ur NEGATIVE NEGATIVE mg/dL   Protein, ur 30 (A) NEGATIVE mg/dL   Nitrite NEGATIVE NEGATIVE   Leukocytes, UA MODERATE (A) NEGATIVE   RBC / HPF TOO NUMEROUS TO COUNT 0 - 5 RBC/hpf   WBC, UA TOO NUMEROUS TO COUNT 0 - 5 WBC/hpf   Bacteria, UA RARE (A) NONE SEEN   Squamous Epithelial / LPF 0-5 (A) NONE SEEN   Mucous PRESENT      HEENT: normal Cardio: RRR and No murmurs Resp: CTA  B/L and Unlabored GI: BS positive and Nontender, nondistended Extremity:  Edema Bilateral 1 plus pretibial and pedal edema Skin:   Other MASD Neuro: Alert/Oriented, Abnormal Sensory Absent sensation to light touch and proprioception bilateral lower limbs and Abnormal Motor 0/5 strength bilateral hip flexor, knee extensor, ankle dorsiflexor, 5/5 bilateral deltoid, bicep, tricep, grip Musc/Skel:  Other No pain with lower extremity range of motion, negative straight leg raise Gen. no acute distress   Assessment/Plan: 1. Functional deficits secondary to paraplegia which require 3+ hours per day of interdisciplinary therapy in a comprehensive inpatient rehab setting. Physiatrist is providing close team supervision and 24 hour management of active medical problems listed below. Physiatrist and rehab team continue to assess barriers to discharge/monitor patient progress toward functional and medical goals. FIM: Function - Bathing Position: Bed Body parts bathed by patient: Right arm, Left arm, Chest, Abdomen, Front perineal area, Right upper leg, Left upper leg Body parts bathed by helper: Buttocks, Right lower leg, Left lower leg, Back  Function- Upper Body Dressing/Undressing What is the patient wearing?: Pull over shirt/dress Pull over shirt/dress - Perfomed by helper: Thread/unthread right sleeve, Thread/unthread left sleeve, Put head through opening, Pull shirt over trunk Assist Level:  (Total A, sitting unsupported) Function - Lower Body Dressing/Undressing What is the patient wearing?: Pants, Non-skid slipper socks Position: Bed Pants- Performed by helper: Thread/unthread right pants leg, Thread/unthread left pants leg, Pull pants up/down Non-skid slipper socks- Performed by helper: Don/doff right sock, Don/doff left sock TED Hose - Performed by helper: Don/doff right TED hose, Don/doff left TED hose (Teds=ACE wraps) Assist for footwear: Dependant  Function - Toileting Toileting  activity did not occur: No continent bowel/bladder event  Function - Air cabin crew transfer activity did not occur: Safety/medical concerns  Function - Chair/bed transfer Chair/bed transfer method: Other Chair/bed transfer assist level: 2 helpers Chair/bed transfer assistive device: Mechanical lift Mechanical lift: Maximove Chair/bed transfer details: Tactile cues for initiation, Verbal cues for sequencing, Manual facilitation for placement  Function - Locomotion: Wheelchair Will patient use wheelchair at discharge?: Yes Type: Manual Assist  Level: Dependent (Pt equals 0%) (TIS) Function - Locomotion: Ambulation Ambulation activity did not occur: Safety/medical concerns (paraplegia) Walk 10 feet activity did not occur: Safety/medical concerns Walk 50 feet with 2 turns activity did not occur: Safety/medical concerns Walk 150 feet activity did not occur: Safety/medical concerns Walk 10 feet on uneven surfaces activity did not occur: Safety/medical concerns  Function - Comprehension Comprehension: Auditory Comprehension assist level: Understands basic 75 - 89% of the time/ requires cueing 10 - 24% of the time  Function - Expression Expression: Verbal Expression assist level: Expresses basic 75 - 89% of the time/requires cueing 10 - 24% of the time. Needs helper to occlude trach/needs to repeat words.  Function - Social Interaction Social Interaction assist level: Interacts appropriately 90% of the time - Needs monitoring or encouragement for participation or interaction.  Function - Problem Solving Problem solving assist level: Solves basic 50 - 74% of the time/requires cueing 25 - 49% of the time  Function - Memory Memory assist level: Recognizes or recalls 50 - 74% of the time/requires cueing 25 - 49% of the time Patient normally able to recall (first 3 days only): Current season, That he or she is in a hospital  Medical Problem List and Plan: 1. 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 Please see notes. Patient with variable participation. If this patient does not participate in an therapy will need to get palliative involved to set goals of care. 2. PE/Right peroneal DVT/Anticoagulation: Pharmaceutical: Other (comment)--Eliquis no sign of hemorrhage 3. Pain Management: tylenol prn.  4. Mood: LCSW to follow for evaluation and support.  5. Neuropsych: This patient is not capable of making decisions on hisown behalf. 6. Skin/Wound Care: Air mattress for pressure relief and turn frequently to help manage MASD. Will order PRAFO's for BLE. Add protein supplement due to low albumin levels and to promote healing.  7. Fluids/Electrolytes/Nutrition: Monitor I/O. Check lytes in am.  8. Metastatic thoracic cancer: Will add abdominal binder and ACE wrap BLE to support BP. Continue trial of steroids.  9. Hematuria: Monitor H/H serially and urine output. Resume CBI prn clots or discomfort. I would expect some microscopic hematuria, this may be source of anemia 10. 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  11. Delirium: Supportive care--monitor as cognition has worsened on steroids (per daughter). Decadron discontinued, as discussed with daughter unlikely to provide much improvement with paraplegia 12. B 12 deficiency: Now on B 12 injections for supplement 13. AKI: Avoid hypotension and nephrotoxic medications. SCr improved from 2.61-->1.3 range with IVF for hydration--GFR normalized, however, BUN still elevated, continue IV fluids 14. Leucocytosis: Likely reactive--monitor for now.  15. Mild paralytic ileus, start Reglan, liquid diet for today  16. Anemia, likely acute blood loss plus minus B12 deficiency LOS (Days) 3 A FACE TO FACE EVALUATION WAS PERFORMED  Denvil Canning E 07/13/2016, 10:10 AM

## 2016-07-14 ENCOUNTER — Inpatient Hospital Stay (HOSPITAL_COMMUNITY): Payer: Medicare Other | Admitting: Physical Therapy

## 2016-07-14 ENCOUNTER — Inpatient Hospital Stay (HOSPITAL_COMMUNITY): Payer: Medicare Other

## 2016-07-14 ENCOUNTER — Encounter (HOSPITAL_COMMUNITY): Payer: Self-pay | Admitting: *Deleted

## 2016-07-14 LAB — CBC WITH DIFFERENTIAL/PLATELET
Basophils Absolute: 0 10*3/uL (ref 0.0–0.1)
Basophils Relative: 0 %
EOS PCT: 0 %
Eosinophils Absolute: 0 10*3/uL (ref 0.0–0.7)
HEMATOCRIT: 32.8 % — AB (ref 39.0–52.0)
HEMOGLOBIN: 10.6 g/dL — AB (ref 13.0–17.0)
LYMPHS PCT: 14 %
Lymphs Abs: 0.9 10*3/uL (ref 0.7–4.0)
MCH: 29 pg (ref 26.0–34.0)
MCHC: 32.3 g/dL (ref 30.0–36.0)
MCV: 89.9 fL (ref 78.0–100.0)
Monocytes Absolute: 1 10*3/uL (ref 0.1–1.0)
Monocytes Relative: 16 %
NEUTROS PCT: 70 %
Neutro Abs: 4.5 10*3/uL (ref 1.7–7.7)
Platelets: 386 10*3/uL (ref 150–400)
RBC: 3.65 MIL/uL — AB (ref 4.22–5.81)
RDW: 15.6 % — AB (ref 11.5–15.5)
WBC: 6.4 10*3/uL (ref 4.0–10.5)

## 2016-07-14 NOTE — Progress Notes (Signed)
Physical Therapy Note  Patient Details  Name: ELIUS ETHEREDGE MRN: [id number] Date of Birth: 02-21-1922 Today's Date: 07/14/2016    Pt's plan of care adjusted to 15/7 after speaking with care team and discussed with MD in team conference as pt currently unable to tolerate current therapy schedule with OT and PT.    Benjiman Core Tygielski 07/14/2016, 1:44 PM

## 2016-07-14 NOTE — Consult Note (Signed)
[place] Nurse wound follow up Wound type: DTI that has evolved to unstageable pressure injury Wound bed: now presents with yellow/grey tissue covering small area but still intact, some superficial skin peeling  Drainage (amount, consistency, odor) minimal Periwound: intact, blanchable redness Dressing procedure/placement/frequency: Continue soft silicone foam Continue low air loss mattress for pressure redistribution and for moisture management.  [place] Nurse team will follow along with you for weekly wound assessments.  Please notify me of any acute changes in the wounds or any new areas of concerns [place] MSN, RN,CWOCN, [place], [place]

## 2016-07-14 NOTE — Progress Notes (Signed)
[facility])  Received request from [place] for family interest in [facility] services at home after discharge. Chart currently being reviewed by [facility] physician to confirm eligibility.    Spoke with patient's daughter Hal Hope by phone to confirm interest and explain [facility] services including hospice philosophy and team approach to care. Daughter is familiar with hospice services and is considering getting additional care giving support in place. DME discussed and ordered through [facility]. Contact for DME is daughter Hal Hope. EPIC facesheet address confirmed as discharge address. DME ordered includes: hospital bed with OBT, high back reclining wheelchair and hoyer lift. Per [name], expected discharge date is Friday or Saturday.    Please send home with patient scripts for any medication he does not already have including comfort medications.  Please send signed and completed out of facility DNR home with patient.   [facility] liaison will continue to follow daily until discharge.   Thank you,  Erling Conte, LCSW [phone]

## 2016-07-14 NOTE — Patient Care Conference (Signed)
Inpatient RehabilitationTeam Conference and Plan of Care Update Date: 07/13/2016   Time: 8:25 PM    Patient Name: Luis Pierce      Medical Record Number: [id number]  Date of Birth: [birth year] Sex: Male         Room/Bed: 4W14C/4W14C-01 Payor Info: Payor: Theme park manager MEDICARE / Plan: UHC MEDICARE / Product Type: *No Product type* /    Admitting Diagnosis: PE  Admit Date/Time:  06/22/2016  2:23 PM Admission Comments: No comment available   Primary Diagnosis:  Paraplegia at T4 level [facility]) Principal Problem: Paraplegia at T4 level [facility])  Patient Active Problem List   Diagnosis Date Noted  . Neurogenic bowel 07/11/2016  . Neurogenic urinary bladder disorder 07/11/2016  . Metastatic cancer to spine ([place]) 07/17/2016  . History of cancer   . Paraplegia at T4 level ([place])   . Spinal cord compression ([place])   . Embolic stroke ([place]) 40/98/1191  . Pressure injury of skin 07/07/2016  . DVT (deep vein thrombosis) in pregnancy ([place])   . ARF (acute renal failure) ([place]) 07/05/2016  . Leg weakness   . Dehydration 07/04/2016  . 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 Urinary incontinence, male, stress 07/04/2016  . Acute lower UTI 07/04/2016  . B12 deficiency 07/04/2016  . Pulmonary embolism on right ([place])   . Thoracic back pain   . Pulmonary embolism ([place]) 07/03/2016  . Generalized weakness 07/03/2016  . Hypertension 07/03/2016  . Hyperlipidemia 07/03/2016  . Gross hematuria 07/03/2016  . Bilateral leg weakness 07/03/2016  . Hypothermia 07/03/2016    Expected Discharge Date: Expected Discharge Date:  (TBD - pending Palliative care and Hospice consults)  Team Members Present: Physician leading conference: Dr. Delice Lesch Social Worker Present: Lennart Pall, LCSW Nurse Present: Dorien Ch[place], RN PT Present: Kem Parkinson, PT OT Present: Roanna Epley, [place], OT SLP Present: Weston Anna, SLP PPS Coordinator present : Daiva Nakayama, RN, CRRN     Current  Status/Progress Goal Weekly Team Focus  Medical   Mental status fluctuates, history of CVA, paraplegia from metastatic disease to spine, neurogenic bowel, bladder,  Teach Caregiver. Basic transfers and care for home discharge hospice follow-up  Establish whether patient can tolerate inpatient rehabilitation and is motivated,   Bowel/Bladder   foley catheter with conitnuous bladder irrigation prn. Urine yellow, clear and non odorous.Incontinent of bowels. Last BM 6/26.  Monitor for hematuria and manage prn. Monitor for bowel function.  Assess bowel function and need for foley catheter use.   Swallow/Nutrition/ Hydration             ADL's   bed mobility-max A; transfers with Maxi-Move; BADLs-max A/tot A; night bath  mod/max A overall  caregiver training/education, bed mobility, sitting balance, activity tolerance   Mobility   max/totalA +2 overall for bed mobility, transfers with slideboard/maximove, mod/maxA static sitting balance  modA bed mobility, maxA transfers w/c <>bed and car, S w/c propulsion for strengthening  activity tolerance, sitting balance, transfer training   Communication             Safety/Cognition/ Behavioral Observations            Pain   Denies pain this shift.  Pain less than or equal to 2.  Assess for pain.   Skin   Stage 2 to sacrum. Foam dressing in place. CDI. Ecchymosis on abdomen, bilateral arms and right hip area.  No skin breakdown while in rehab.  Assess skin q shift and prn.    Rehab Goals Patient  on target to meet rehab goals: No *See Care Plan and progress notes for long and short-term goals.  Barriers to Discharge: Variable participation    Possible Resolutions to Barriers:  The patient does not participate may need palliative care consult for goals of care, consideration of inpatient hospice if home discharge not possible    Discharge Planning/Teaching Needs:  Pt not participating fully in tx and daughter now requests referral to Hospice and  home d/c by end of week if possible.  NA   Team Discussion:  Foley;  Mostly incontinent of bowel.  Now with ileus and plan to begin IVF.  Stage 2 on buttocks. Minimal participation and family requesting palliative care consult for possible Hospital referral.  Revisions to Treatment Plan:  Plan home with hospice and will downgrade ELOS and goals   Continued Need for Acute Rehabilitation Level of Care: The patient requires daily medical management by a physician with specialized training in physical medicine and rehabilitation for the following conditions: Daily direction of a multidisciplinary physical rehabilitation program to ensure safe treatment while eliciting the highest outcome that is of practical value to the patient.: Yes Daily medical management of patient stability for increased activity during participation in an intensive rehabilitation regime.: Yes Daily analysis of laboratory values and/or radiology reports with any subsequent need for medication adjustment of medical intervention for : Neurological problems;Urological problems;Mood/behavior problems;Nutritional problems;Pulmonary problems  Marvin Grabill 07/14/2016, 3:59 PM

## 2016-07-14 NOTE — Progress Notes (Signed)
Social Work Patient ID: Luis Pierce, male   DOB: 13-Feb-1922, 83 y.o.   MRN: [id number]   Palliative Care and Hospice referral made. Hospice SW to follow up with pt's daughter to begin planning for home d/c.  I will work with this SW to determine needed DME and will provide family with private duty agency list.  Family hoping to have pt home by end of this week.  Ajanae Virag, LCSW

## 2016-07-14 NOTE — Progress Notes (Signed)
Social Work  Social Work Assessment and Plan  Patient Details  Name: Luis Pierce MRN: [id number] Date of Birth: 02-27-1922  Today's Date: 07/13/2016  Problem List:  Patient Active Problem List   Diagnosis Date Noted  . Neurogenic bowel 07/11/2016  . Neurogenic urinary bladder disorder 07/11/2016  . Metastatic cancer to spine ([place]) 06/19/2016  . History of cancer   . Paraplegia at T4 level ([place])   . Spinal cord compression ([place])   . Embolic stroke ([place]) 08/31/4816  . Pressure injury of skin 07/07/2016  . DVT (deep vein thrombosis) in pregnancy ([place])   . ARF (acute renal failure) ([place]) 07/05/2016  . Leg weakness   . Dehydration 07/04/2016  . 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 Urinary incontinence, male, stress 07/04/2016  . Acute lower UTI 07/04/2016  . B12 deficiency 07/04/2016  . Pulmonary embolism on right ([place])   . Thoracic back pain   . Pulmonary embolism ([place]) 07/03/2016  . Generalized weakness 07/03/2016  . Hypertension 07/03/2016  . Hyperlipidemia 07/03/2016  . Gross hematuria 07/03/2016  . Bilateral leg weakness 07/03/2016  . Hypothermia 07/03/2016   Past Medical History:  Past Medical History:  Diagnosis Date  . Blind left eye    retinal detached retina  . 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 Chronic kidney disease    hx kidney stones and urinary incontinence  . Colon cancer ([place])   . Coronary artery disease   . Diverticulosis   . DJD (degenerative joint disease)   . Glaucoma   . Hyperlipemia   . Hypertension   . Lipoma of back   . Melanoma of back ([place])   . Mild mitral regurgitation   . Prostate cancer ([place])   . Retinal detachment   . Urinary incontinence   . Urinary incontinence, male, stress 07/04/2016   Past Surgical History:  Past Surgical History:  Procedure Laterality Date  . APPENDECTOMY    . CARDIAC CATHETERIZATION    . CHOLECYSTECTOMY    . CORONARY ARTERY BYPASS GRAFT  1982   1 vessel  . CYSTOSCOPY  09/21/2011   Procedure:  CYSTOSCOPY;  Surgeon: Reece Packer, MD;  Location: [facility];  Service: Urology;  Laterality: N/A;  . EYE SURGERY     detached retinas bilateral,bil.cataract  . TONSILLECTOMY     as child  . URINARY SPHINCTER IMPLANT  09/21/2011   Procedure: ARTIFICIAL URINARY SPHINCTER;  Surgeon: Reece Packer, MD;  Location: [facility];  Service: Urology;  Laterality: N/A;   Social History:  reports that he has never smoked. He has never used smokeless tobacco. He reports that he drinks about 0.6 oz of alcohol per week . He reports that he does not use drugs.  Family / Support Systems Marital Status: Married Patient Roles: Spouse, Parent Spouse/Significant Other: wife, Luis Pierce @ [phone] Children: daughter, Luis Pierce @ (Luis Pierce) [phone], (W) [phone] or (C) [phone];  two other adult children living out of town. Anticipated Caregiver: hired caregivers and family Ability/Limitations of Caregiver: daughter, Hal Hope works, daughter from [place] intermittent Caregiver Availability: 24/7 Family Dynamics: Family very supportive and "just want what's best for Dad."  Social History Preferred language: English Religion: Lutheran Cultural Background: NA Read: Yes Write: Yes Employment Status: Retired Freight forwarder Issues: None Guardian/Conservator: None - per MD, pt is not capable of making decisions on his own behalf. Defer to wife/ daughters   Abuse/Neglect Physical Abuse: Denies Verbal Abuse: Denies Sexual Abuse: Denies Exploitation of patient/patient's resources: Denies Self-Neglect:  Denies  Emotional Status Pt's affect, behavior adn adjustment status: Pt lying in bed and unable to complete assessment due to significant cognitive impairments. He is oriented to person, however, could not engage in further conversation. Will monitor mood while here and as cognition allows. Recent Psychosocial Issues: NA Pyschiatric History: NA Substance Abuse History:  NA  Patient / Family Perceptions, Expectations & Goals Pt/Family understanding of illness & functional limitations: Daughter, Luis Pierce with very good understanding of pt's prognosis.  She is requesting a palliative care consult now as pt has not been able to fully participate in our program.   Premorbid pt/family roles/activities: Pt needed some assistance at home PTA Anticipated changes in roles/activities/participation: Pt will require 24/7 physical care.  Family plans to provide this along with, possibly, Hospice and private duty support. Pt/family expectations/goals: Daughter states, "the whole thought was to get him home so he can die at home."  [employer]: None Premorbid Home Care/DME Agencies: None Transportation available at discharge: yes Resource referrals recommended: Other (Comment) (Palliative Care and Hospice)  Discharge Planning Living Arrangements: Spouse/significant other Support Systems: Spouse/significant other, Children Type of Residence: Private residence Insurance Resources:  ([place] Medicare) Financial Resources: [place] Referred: No Living Expenses: Own Money Management: Family Does the patient have any problems obtaining your medications?: No Home Management: family Patient/Family Preliminary Plans: Family hopes to get patient home be the end of the week. Social Work Anticipated Follow Up Needs:  (Hospice if appropriate) Expected length of stay: 5-7 days  Clinical Impression Elderly gentleman here with cancer mets to spine and paraplegia.  He is unable to complete a full assessment due to degree of cognitive impairment and poor orientation.  Spoke with daughter to complete assessment (Luis Pierce) and explained that team reports pt has been resistant to therapies.  She requests a referral to Palliative Care and hope to get pt home by the end of this week with Hospice Care.  Have begun the process.  [name],  Luis Pierce 07/13/2016, 3:49 PM

## 2016-07-14 NOTE — Progress Notes (Signed)
Physical Therapy Session Note  Patient Details  Name: Luis Pierce MRN: [id number] Date of Birth: May 09, 1922  Today's Date: 07/14/2016  Short Term Goals: Week 1:  PT Short Term Goal 1 (Week 1): Pt will be able to initiate slideboard transfer with total +2 assist PT Short Term Goal 2 (Week 1): Pt will be able to demonstrate sitting balance edge of mat with max assist functionally PT Short Term Goal 3 (Week 1): Pt will be able to verbalize sequencing for rolling in the bed  Skilled Therapeutic Interventions/Progress Updates:  Attempted to see pt for scheduled PT session. Pt asleep in bed and would not open eyes with verbal & tactile stimulation. Pt stated "not now" when asked to get up and commented about wanting to sleep. Pt left in bed; pt missed 30 minutes of skilled PT treatment 2/2 fatigue. Will f/u per POC.  Therapy Documentation Precautions:  Precautions Precautions: Fall Precaution Comments: watch BP; BLE wraps Required Braces or Orthoses: Other Brace/Splint Other Brace/Splint: Bilateral PRAFOs for in bed Restrictions Weight Bearing Restrictions: No   General: PT Amount of Missed Time (min): 30 Minutes PT Missed Treatment Reason: Patient fatigue   See Function Navigator for Current Functional Status.   Therapy/Group: Individual Therapy  Waunita Schooner 07/14/2016, 1:13 PM

## 2016-07-14 NOTE — Care Management Note (Signed)
[facility] Individual Statement of Services  Patient Name:  Luis Pierce  Date:  07/14/2016  Welcome to the [facility].  Our goal is to provide you with an individualized program based on your diagnosis and situation, designed to meet your specific needs.  With this comprehensive rehabilitation program, you will be expected to participate in at least 3 hours of rehabilitation therapies Monday-Friday, with modified therapy programming on the weekends.  Your rehabilitation program will include the following services:  Physical Therapy (PT), Occupational Therapy (OT), Speech Therapy (ST), 24 hour per day rehabilitation nursing, Case Management (Social Worker), Rehabilitation Medicine, Nutrition Services and Pharmacy Services  Weekly team conferences will be held on Tuesdays to discuss your progress.  Your Social Worker will talk with you frequently to get your input and to update you on team discussions.  Team conferences with you and your family in attendance may also be held.  Expected length of stay: 5 - 7 days  Overall anticipated outcome: total care  Depending on your progress and recovery, your program may change. Your Social Worker will coordinate services and will keep you informed of any changes. Your Social Worker's name and contact numbers are listed  below.  The following services may also be recommended but are not provided by the [facility] will be made to provide these services after discharge if needed.  Arrangements include referral to agencies that provide these services.  Your insurance has been verified to be:  [facility] Medicare Your primary doctor is:  Dr. Felipa Eth  Pertinent information will be shared with your doctor and your insurance  company.  Social Worker:  [place], [place] or ([phone]   Information discussed with and copy given to patient by: Lennart Pall, 07/14/2016, 3:54 PM

## 2016-07-14 NOTE — Progress Notes (Signed)
Occupational Therapy Note  Patient Details  Name: Luis Pierce MRN: [id number] Date of Birth: [birth year]  Today's Date: 07/14/2016 OT Missed Time: 37 Minutes Missed Time Reason: Patient fatigue  Pt missed 60 mins skilled OT services 2/2 fatigue.  Pt returned to bed in earlier PT session 2/2 fatigue and pt's request.  Pt stated he was too tired and didn't want to "do anything."   Leroy Libman 07/14/2016, 10:30 AM

## 2016-07-14 NOTE — Progress Notes (Signed)
No charge note.  I reviewed the chart and spoke with Reesa Chew.  I then spoke with Dr. Cipriano Mile regarding her father.  The family understands he is nearing end of life due to metastatic cancer in his spine, para plegia, PE/DVT, multiple CVAs and minimal PO intake and advanced age. The family's goal is for him to pass away peacefully at home surrounded by family.    To this end,  I placed a case management order to engage [facility] at Dr. Thompson Caul request.  Imogene Burn, PA-C Palliative Medicine Pager: [phone]

## 2016-07-14 NOTE — Progress Notes (Signed)
Physical Therapy Session Note  Patient Details  Name: Luis Pierce MRN: [id number] Date of Birth: [birth year]  Today's Date: 07/14/2016 PT Individual Time: 0800-0830 and 0930-1000 PT Individual Time Calculation (min): 30 min and 30 min   Short Term Goals: Week 1:  PT Short Term Goal 1 (Week 1): Pt will be able to initiate slideboard transfer with total +2 assist PT Short Term Goal 2 (Week 1): Pt will be able to demonstrate sitting balance edge of mat with max assist functionally PT Short Term Goal 3 (Week 1): Pt will be able to verbalize sequencing for rolling in the bed  Skilled Therapeutic Interventions/Progress Updates: Tx 1:  Pt received supine in bed, denies pain and agreeable to treatment with therapist after describing plan to get OOB into wheelchair. Vitals assessed BP 129/69 HR 104bpm, O2 96% on room air. Rolling R/L with modA, tactile cueing for UE placement and assist for placement of LE into hooklying. Upon rolling to place maxisling, pt had incontinent bowel movement. [place] for hygiene and managing brief. Transfer to w/c with totalA +2 using maximove. Pt lethargic throughout session, arousable and agreeable to participate when awake but falls back asleep quickly, states "I'm doing bad, bad, bad" and "I want to die" when he does awaken for brief moments. Pt remained semi-reclined in w/c at end of session to rest; quick release belt intact and all needs in reach.   Tx 2: Pt received semi-reclined in w/c; denies pain but perseverative on going back to bed. Transfer to bed totalA +2 via maximove. Rolling R/L in bed with modA to change brief after incontinent bowel movement. Remained semi reclined in bed at end of session, all needs in reach.   Tx 3: pt received seated in bed, declines participation in therapy session despite encouragement to get OOB. Pt requesting water; therapist elevated HOB and provided pt with water via straw. Pt with significant coughing episode; RN alerted. Remained  in bed at end of session, all needs in reach. Missed 60 min PT d/t fatigue/refusal.      Therapy Documentation Precautions:  Precautions Precautions: Fall Precaution Comments: watch BP; BLE wraps Required Braces or Orthoses: Other Brace/Splint Other Brace/Splint: Bilateral PRAFOs for in bed Restrictions Weight Bearing Restrictions: No General: PT Amount of Missed Time (min): 30 Minutes and 60 min (total 90 min) PT Missed Treatment Reason: Patient fatigue   See Function Navigator for Current Functional Status.   Therapy/Group: Individual Therapy  Luberta Mutter 07/14/2016, 8:38 AM

## 2016-07-15 ENCOUNTER — Encounter (HOSPITAL_COMMUNITY): Payer: Self-pay | Admitting: *Deleted

## 2016-07-15 ENCOUNTER — Inpatient Hospital Stay (HOSPITAL_COMMUNITY): Payer: Medicare Other | Admitting: Physical Therapy

## 2016-07-15 ENCOUNTER — Inpatient Hospital Stay (HOSPITAL_COMMUNITY): Payer: Medicare Other

## 2016-07-15 LAB — BASIC METABOLIC PANEL
Anion gap: 13 (ref 5–15)
BUN: 56 mg/dL — AB (ref 6–20)
CHLORIDE: 111 mmol/L (ref 101–111)
CO2: 14 mmol/L — AB (ref 22–32)
CREATININE: 1.22 mg/dL (ref 0.61–1.24)
Calcium: 7.8 mg/dL — ABNORMAL LOW (ref 8.9–10.3)
GFR calc Af Amer: 58 mL/min — ABNORMAL LOW (ref >60)
GFR calc non Af Amer: 50 mL/min — ABNORMAL LOW (ref >60)
Glucose, Bld: 120 mg/dL — ABNORMAL HIGH (ref 65–99)
Potassium: 4.6 mmol/L (ref 3.5–5.1)
Sodium: 138 mmol/L (ref 135–145)

## 2016-07-15 MED ORDER — MAGIC MOUTHWASH
10.0000 mL | Freq: Four times a day (QID) | ORAL | Status: DC
  Administered 2016-07-15 – 2016-07-16 (×2): 10 mL via ORAL
  Filled 2016-07-15 (×3): qty 10

## 2016-07-15 NOTE — Progress Notes (Signed)
Occupational Therapy Note  Patient Details  Name: ORVAN PAPADAKIS MRN: [id number] Date of Birth: 1922-03-05  Today's Date: 07/15/2016 OT Individual Time: 0352-4818 OT Individual Time Calculation (min): 23 min   Pt denied pain but c/o discomfort sitting in TIS w/c; repositioned and returned to bed Individual Therapy  Pt asleep in TIS w/c upon arrival.  Pt required mod multimodal cues to arouse. Pt declined eating lunch at this time. Pt stated he was uncomfortable and desired to return to bed. Pt returned to bed with Maxi-move.  Pt followed one step commands with tactile cues, I.e.; hand placement and rolling in bed for removal of sling and positioning in bed.  Pt fell back asleep almost immediately after positioning in bed. Focus on following one step commands and bed mobility.     Leotis Shames [facility] 07/15/2016, 1:54 PM

## 2016-07-15 NOTE — Progress Notes (Signed)
Subjective/Complaints: Refused meds except for Eliquis last night.  Does not want to participate with physical therapy today. Spoke with daughter. Yesterday, she plans to come in for family training this afternoon and on Friday. Review of systems negative for chest pain, shortness of breath, abdominal pain, nausea or vomiting. Objective: Vital Signs: Blood pressure (!) 103/52, pulse (!) 103, temperature 97.8 F (36.6 C), temperature source Oral, resp. rate 18, weight 83.6 kg (184 lb 3.2 oz), SpO2 97 %. No results found. Results for orders placed or performed during the hospital encounter of 07/15/2016 (from the past 72 hour(s))  CBC WITH DIFFERENTIAL     Status: Abnormal   Collection Time: 07/14/16 12:23 AM  Result Value Ref Range   WBC 6.4 4.0 - 10.5 K/uL   RBC 3.65 (L) 4.22 - 5.81 MIL/uL   Hemoglobin 10.6 (L) 13.0 - 17.0 g/dL   HCT 32.8 (L) 39.0 - 52.0 %   MCV 89.9 78.0 - 100.0 fL   MCH 29.0 26.0 - 34.0 pg   MCHC 32.3 30.0 - 36.0 g/dL   RDW 15.6 (H) 11.5 - 15.5 %   Platelets 386 150 - 400 K/uL   Neutrophils Relative % 70 %   Lymphocytes Relative 14 %   Monocytes Relative 16 %   Eosinophils Relative 0 %   Basophils Relative 0 %   Neutro Abs 4.5 1.7 - 7.7 K/uL   Lymphs Abs 0.9 0.7 - 4.0 K/uL   Monocytes Absolute 1.0 0.1 - 1.0 K/uL   Eosinophils Absolute 0.0 0.0 - 0.7 K/uL   Basophils Absolute 0.0 0.0 - 0.1 K/uL   RBC Morphology POLYCHROMASIA PRESENT    WBC Morphology MILD LEFT SHIFT (1-5% METAS, OCC MYELO, OCC BANDS)   Basic metabolic panel     Status: Abnormal   Collection Time: 07/15/16  5:45 AM  Result Value Ref Range   Sodium 138 135 - 145 mmol/L   Potassium 4.6 3.5 - 5.1 mmol/L    Comment: HEMOLYSIS AT THIS LEVEL MAY AFFECT RESULT   Chloride 111 101 - 111 mmol/L   CO2 14 (L) 22 - 32 mmol/L   Glucose, Bld 120 (H) 65 - 99 mg/dL   BUN 56 (H) 6 - 20 mg/dL   Creatinine, Ser 1.22 0.61 - 1.24 mg/dL   Calcium 7.8 (L) 8.9 - 10.3 mg/dL   GFR calc non Af Amer 50 (L) >60 mL/min    GFR calc Af Amer 58 (L) >60 mL/min    Comment: (NOTE) The eGFR has been calculated using the CKD EPI equation. This calculation has not been validated in all clinical situations. eGFR's persistently <60 mL/min signify possible Chronic Kidney Disease.    Anion gap 13 5 - 15     HEENT: normal Cardio: RRR and No murmurs Resp: CTA B/L and Unlabored GI: BS positive and Nontender, nondistended Extremity:  Edema Bilateral 1 plus pretibial and pedal edema Skin:   Other MASD Neuro: Alert/Oriented, Abnormal Sensory Absent sensation to light touch and proprioception bilateral lower limbs and Abnormal Motor 0/5 strength bilateral hip flexor, knee extensor, ankle dorsiflexor, 5/5 bilateral deltoid, bicep, tricep, grip Musc/Skel:  Other No pain with lower extremity range of motion, negative straight leg raise Gen. no acute distress   Assessment/Plan: 1. Functional deficits secondary to paraplegia which require 3+ hours per day of interdisciplinary therapy in a comprehensive inpatient rehab setting. Physiatrist is providing close team supervision and 24 hour management of active medical problems listed below. Physiatrist and rehab team continue to assess  barriers to discharge/monitor patient progress toward functional and medical goals. FIM: Function - Bathing Position: Bed Body parts bathed by patient: Right arm, Left arm, Chest, Abdomen, Front perineal area, Right upper leg, Left upper leg Body parts bathed by helper: Buttocks, Right lower leg, Left lower leg, Back  Function- Upper Body Dressing/Undressing What is the patient wearing?: Pull over shirt/dress Pull over shirt/dress - Perfomed by helper: Thread/unthread right sleeve, Thread/unthread left sleeve, Put head through opening, Pull shirt over trunk Assist Level:  (Total A, sitting unsupported) Function - Lower Body Dressing/Undressing What is the patient wearing?: Pants, Non-skid slipper socks Position: Bed Pants- Performed by  helper: Thread/unthread right pants leg, Thread/unthread left pants leg, Pull pants up/down Non-skid slipper socks- Performed by helper: Don/doff right sock, Don/doff left sock TED Hose - Performed by helper: Don/doff right TED hose, Don/doff left TED hose (Teds=ACE wraps) Assist for footwear: Dependant  Function - Toileting Toileting activity did not occur: No continent bowel/bladder event Toileting steps completed by helper: Adjust clothing prior to toileting, Performs perineal hygiene, Adjust clothing after toileting Assist level: Two helpers  Function - Air cabin crew transfer activity did not occur: Safety/medical concerns  Function - Chair/bed transfer Chair/bed transfer method: Other Chair/bed transfer assist level: 2 helpers Chair/bed transfer assistive device: Mechanical lift Mechanical lift: Maximove Chair/bed transfer details: Tactile cues for initiation, Verbal cues for sequencing, Verbal cues for precautions/safety, Manual facilitation for placement  Function - Locomotion: Wheelchair Will patient use wheelchair at discharge?: Yes Type: Manual Max wheelchair distance: 14f Assist Level: Moderate assistance (Pt 50 - 74%) Assist Level: Moderate assistance (Pt 50 - 74%) Function - Locomotion: Ambulation Ambulation activity did not occur: Safety/medical concerns (paraplegia) Walk 10 feet activity did not occur: Safety/medical concerns Walk 50 feet with 2 turns activity did not occur: Safety/medical concerns Walk 150 feet activity did not occur: Safety/medical concerns Walk 10 feet on uneven surfaces activity did not occur: Safety/medical concerns  Function - Comprehension Comprehension: Auditory Comprehension assist level: Understands basic 75 - 89% of the time/ requires cueing 10 - 24% of the time  Function - Expression Expression: Verbal Expression assist level: Expresses basic 75 - 89% of the time/requires cueing 10 - 24% of the time. Needs helper to occlude  trach/needs to repeat words.  Function - Social Interaction Social Interaction assist level: Interacts appropriately 75 - 89% of the time - Needs redirection for appropriate language or to initiate interaction.  Function - Problem Solving Problem solving assist level: Solves basic 25 - 49% of the time - needs direction more than half the time to initiate, plan or complete simple activities  Function - Memory Memory assist level: Recognizes or recalls 25 - 49% of the time/requires cueing 50 - 75% of the time Patient normally able to recall (first 3 days only): That he or she is in a hospital  Medical Problem List and Plan: 1. 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 Family training prior to discharge. May discharge on 6/29 or 6/30 once training is completed Home hospice, DO NOT RESUSCITATE 2. PE/Right peroneal DVT/Anticoagulation: Pharmaceutical: Other (comment)--Eliquis no sign of hemorrhage 3. Pain Management: tylenol prn.  4. Mood: LCSW to follow for evaluation and support.  5. Neuropsych: This patient is not capable of making decisions on hisown behalf. 6. Skin/Wound Care: Air mattress for pressure relief and turn frequently to help manage MASD. Will order PRAFO's for BLE. Add protein supplement due to low albumin levels  and to promote healing.  7. Fluids/Electrolytes/Nutrition: Monitor I/O. Check lytes in am.  8. Metastatic thoracic cancer: Will add abdominal binder and ACE wrap BLE to support BP. Continue trial of steroids.  9. Hematuria: No further gross hematuria noted. Monitor urine output. 10. 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  11. Delirium: Supportive care--monitor as cognition has worsened on steroids (per daughter). Decadron discontinued, as discussed with daughter unlikely to provide much improvement with paraplegia 12. B 12 deficiency: Now on B 12 injections for supplement 13. AKI: Avoid  hypotension and nephrotoxic medications. SCr improved from 2.61-->1.3 range with IVF for hydration--GFR normalized, however, BUN still elevated, continue IV fluids 14. Leucocytosis: Likely reactive--monitor for now.  15. Mild paralytic ileus +/- neurogenic bowel, start Reglan, liquid diet for today  16. Anemia, likely acute blood loss plus minus B12 deficiency LOS (Days) 5 A FACE TO FACE EVALUATION WAS PERFORMED  Luis Pierce E 07/15/2016, 10:58 AM

## 2016-07-15 NOTE — Progress Notes (Signed)
Social Work Patient ID: Luis Pierce, male   DOB: 09-Oct-1922, 83 y.o.   MRN: [id number]   Have spoken today with Luis Pierce with Hospice and with daughter, Luis Pierce, to coordinate d/c for tomorrow.  All DME to be delivered to the home this afternoon.  Have scheduled for daughter to be in tomorrow morning at 9:00 to get equipment training from PT.  Daughter planning for pt to d/c home via ambulance after this education is completed.  Hospice ready to start service in the home tomorrow.  Team aware.  Luis Pete, LCSW

## 2016-07-15 NOTE — Progress Notes (Signed)
Physical Therapy Session Note  Patient Details  Name: Luis Pierce MRN: [id number] Date of Birth: 1922/06/17  Today's Date: 07/15/2016 PT Individual Time: 1000-1045 PT Individual Time Calculation (min): 45 min   Short Term Goals: Week 1:  PT Short Term Goal 1 (Week 1): Pt will be able to initiate slideboard transfer with total +2 assist PT Short Term Goal 2 (Week 1): Pt will be able to demonstrate sitting balance edge of mat with max assist functionally PT Short Term Goal 3 (Week 1): Pt will be able to verbalize sequencing for rolling in the bed  Skilled Therapeutic Interventions/Progress Updates:  Pt received supine in bed requiring brief change due to incontinent bowel movement. Pt mod/max A rolling R/L to change brief with total A; pt still incontinent so bed pan placed. Mod A rolling R/L to place sling for transfer to tilt in space w/c with maxi move. Total A transfer with maxi move from bed>w/c. Pt appears more disoriented during this session, quickly falling asleep while in bed. Pt left seated in semi reclined in tilt in space w/c with all needs in reach.   Therapy Documentation Precautions:  Precautions Precautions: Fall Precaution Comments: watch BP; BLE wraps Required Braces or Orthoses: Other Brace/Splint Other Brace/Splint: Bilateral PRAFOs for in bed Restrictions Weight Bearing Restrictions: No   See Function Navigator for Current Functional Status.   Therapy/Group: Individual Therapy  Alysia Penna 07/15/2016, 2:38 PM

## 2016-07-16 ENCOUNTER — Ambulatory Visit (HOSPITAL_COMMUNITY): Payer: Medicare Other | Admitting: Physical Therapy

## 2016-07-16 ENCOUNTER — Inpatient Hospital Stay (HOSPITAL_COMMUNITY): Payer: Medicare Other

## 2016-07-18 NOTE — Progress Notes (Addendum)
On entering the room RN noted a change in the patient's breathing pattern and the RN attempted to rouse the patient, but patient was unresponsive. RN noted Cheyne-Stokes breathing pattern from the patient and then the patient stopped breathing entirely; RN attempted to collect vital signs after calling another nurse to help assist with the assessment. No carotid, radial or femoral pulse felt on palpation and no apical pulse heard on auscultation. No further chest expansion noted nor breath sounds auscultated. No percentage reading on the pulse oximeter. Patient appeared pale with limp extremities and fingertips were slightly cyanotic. Pronounced deceased with another RN Fritzi Mandes) on 07/16/2017 at 03:52. On-call provider contacted and family contacted; attempts made to contact [facility] with no response; called [facility].    Annita Brod, RN July 22, 2016

## 2016-07-18 NOTE — Death Summary Note (Signed)
DEATH SUMMARY   Patient Details  Name: Luis Pierce MRN: [id number] DOB: 1922/11/02  Admission/Discharge Information   Admit Date:  2016/07/23  Date of Death: Date of Death: 2016/07/29  Time of Death: Time of Death: 0352  Length of Stay: 6  Referring Physician: Merlene Laughter, MD   Reason(s) for Hospitalization  Metastatic cancer with cord compression and paralegia.   Diagnoses  Preliminary cause of death:  Secondary Diagnoses (including complications and co-morbidities):  Principal Problem:   Paraplegia at T4 level [facility]) Active Problems:   Pulmonary embolism (HCC)   Metastatic cancer to spine [facility])   Neurogenic bowel   Neurogenic urinary bladder disorder   Brief Hospital Course (including significant findings, care, treatment, and services provided and events leading to death)  Luis Pierce is a 83 y.o. year old male who   Luis Pierce is a 83 year old male with history of HTN, CAD s/p CABG, legally blind, prostate cancer, melanoma, colon cancer who was admitted on 07/03/16 with BLE weakness with numbness, inability to walk, slurred speech as well as back and chest pain. He was found to have UTI with hematuria as well as Right PE,  RLE DVT, ATN from hypotension and NSAIDs as well as left temporal stroke felt to be due to  paradoxical emboli from RLE DVT or hypercoagulable state. MRI spine showed metastatic lesions in thoracic region with extraosseous tumor extenison at T3 level with severe central canal stenosis and mass effect on spinal cord.  Dr. Lovell Sheehan consulted and recommended trial of IV decadron but patient had  worsening of confusion with this. Patient with significant decline in mobility as well as abilityt to carry out ADL tasks therefore CIR recommended for follow up therapy.   He was admitted to rehab 07/23/2016 for intensive rehab program. He continued to have issues with confusion and as well as poor po intake. Steroids were discontinued due to confusion.    Air  mattress overlay was ordered for pressure relief and due to unstageable pressure injury. He continued to be incontinent of bowel and bladder with dense paraparesis. He has had limited participation due to fatigue with poor activity tolerance and bouts of lethargy. His intake remained poor and family elected on taking patient home with hospice care. On early am of 07/30/2022 he had decline in respiratory status with cheyne stokes breathing followed by cardiopulmonary arrest. He was pronounced deceased on Jul 28, 2016 at 3:52 am.      Pertinent Labs and Studies  Significant Diagnostic Studies Ct Head Wo Contrast  Result Date: 07/05/2016 CLINICAL DATA:  Altered mental status and slurred speech. Agitation, new confusion, in a patient with anticoagulation. Acute lower extremity weakness concerning for cord infarct. EXAM: CT HEAD WITHOUT CONTRAST TECHNIQUE: Contiguous axial images were obtained from the base of the skull through the vertex without intravenous contrast. COMPARISON:  07/03/2016. FINDINGS: Motion degradation due to snoring. Brain: No evidence for acute infarction, hemorrhage, mass lesion, hydrocephalus, or extra-axial fluid. Generalized atrophy. Chronic microvascular ischemic change. Vascular: No signs of proximal large vessel occlusion. Advanced atherosclerotic calcification of the skull base internal carotid arteries and distal vertebral arteries. Skull: Normal. Negative for fracture or focal lesion. Sinuses/Orbits: No layering fluid in the sinuses. Unusual appearing banding devices are noted, surrounding both globes. The band on the RIGHT is partially metallic. The band on the LEFT is low attenuation, near fat density. BILATERAL cataract extraction. Other: None. Compared with 07/03/2016, similar appearance. IMPRESSION: In this patient who is anticoagulated, with altered mental status,  there are no acute areas of parenchymal or subarachnoid hemorrhage. Stable appearing atrophy with extensive white matter  disease. BILATERAL ocular banding procedures, as discussed above. Electronically Signed   By: Staci Righter M.D.   On: 07/05/2016 13:39    Mr Thoracic Spine Wo Contrast  Result Date: 07/08/2016 CLINICAL DATA:  Episode of weakness and numbness in the lower extremities bilaterally 07/03/2016. Acute/subacute left temporal lobe infarct. Abnormal signal within the upper thoracic spinal cord on the cervical spine MRI. The examination had to be discontinued prior to completion due to patient refusal for further imaging. EXAM: MRI CERVICAL AND THORACIC SPINE WITHOUT CONTRAST TECHNIQUE: Multiplanar and multiecho pulse sequences of the cervical spine, to include the craniocervical junction and cervicothoracic junction, and thoracic spine, were obtained without intravenous contrast. COMPARISON:  MRI of cervical spine 07/06/2016. FINDINGS: MRI CERVICAL SPINE FINDINGS Alignment: AP alignment is anatomic. There straightening of the normal cervical lordosis. Vertebrae: Chronic endplate marrow changes are again noted at C4-5 and on the right at C6-7. Vertebral body heights are maintained. Cord: Cervical spinal cord signal is normal. Posterior Fossa, vertebral arteries, paraspinal tissues: The craniocervical junction is normal. The visualized intracranial contents are normal. Flow is present in the vertebral arteries bilaterally. Disc levels: Disc disease is not significantly changed. C2-3: Moderate right foraminal narrowing is due to uncovertebral and facet spurring. C3-4: A broad-based disc osteophyte complex and facet hypertrophy contributes to moderate left greater than right foraminal narrowing and moderate right central canal stenosis. C4-5: Moderate to severe central canal stenosis is present. There is distortion of the spinal cord. The canal is narrowed to 7 mm. Severe foraminal stenosis is worse on the left. C5-6: A rightward disc osteophyte complex is present. There is partial effacement of the ventral CSF. Moderate  right and mild left foraminal narrowing is present. C6-7: Uncovertebral spurring bilaterally is stable. Mild foraminal narrowing is worse on the left. C7-T1: Moderate facet hypertrophy is present bilaterally without significant stenosis. MRI THORACIC SPINE FINDINGS Alignment:  AP alignment is anatomic. Vertebrae: There is diffuse heterogeneous marrow signal. Abnormal signal is present diffusely within the T3 vertebral body involving the posterior elements bilaterally. There is abnormal signal on the right at T4 extending into the posterior elements. Epidural soft tissue extends cephalo caudad 4.3 cm from the mid body aches T2-3 the midbody of T4. This results in significant central canal stenosis. There is abnormal signal within the spinal cord at this level. Extensive T2 signal changes are present within the soft tissues posterior to the thoracic spine at this level as well. The lesion at T9 measures 3.1 x 1.9 cm on the right. A smaller superior right-sided lesion measures 9 mm. A 10.5 mm lesion is evident posteriorly and inferiorly on the left at T11. No other definite involvement of posterior elements is noted. There is also abnormal signal in the right pedicle of T4. Cord: Abnormal cord signal is present from the midbody of T2 through T4 associated with cord compression by extraosseous soft tissue mass. Paraspinal and other soft tissues: Abnormal signal is present within the posterior paraspinal soft tissues T2-4. Disc levels: No significant focal disc protrusion is present. Facet hypertrophy contributes to mild right foraminal narrowing at T2-3. The foramina are otherwise patent. No other significant central canal stenosis is present. IMPRESSION: 1. Multiple osseous lesions within the thoracic spine, most notably at T3, T4, T9, and T11, suggesting metastatic disease. Multiple myeloma is also considered. 2. Extraosseous soft tissue mass likely reflecting tumor extension at the T3 level  results in severe central  canal stenosis with mass effect on the spinal cord. Abnormal cord signal a a mix extends from the midbody of T2 through the midbody of T4 . Central cord tumor is considered less likely. 3. Soft tissue signal changes posteriorly at the T2 level and extending above. This may reflect tumor or infection. 4. No significant extraosseous extension at the T9 or T10 levels. 5. Stable multilevel spondylosis in the cervical spine. 6. Heterogeneous marrow signal of the cervical spine appears be mostly discogenic. Additional sites of metastatic disease are not excluded. 7. The patient refused further imaging with contrast. Critical Value/emergent results were called by telephone at the time of interpretation on 07/08/2016 at 11:51 am to Dr. Rosalin Hawking , who verbally acknowledged these results. Electronically Signed   By: San Morelle M.D.   On: 07/08/2016 12:23     Dg Abd Portable 1v  Result Date: 07/05/2016 CLINICAL DATA:  Abdominal pain and distention. EXAM: PORTABLE ABDOMEN - 1 VIEW COMPARISON:  CT scan July 08, 2016 FINDINGS: Dilated small bowel loops are unchanged since July 08, 2016 suggesting a partial or early small bowel obstruction. IMPRESSION: No change in the early or partial small bowel obstruction. Electronically Signed   By: Dorise Bullion III M.D   On: 07/12/2016 15:11    Microbiology Recent Results (from the past 240 hour(s))  Culture, Urine     Status: None   Collection Time: 07/12/16  8:53 AM  Result Value Ref Range Status   Specimen Description URINE, CLEAN CATCH  Final   Special Requests NONE  Final   Culture NO GROWTH  Final   Report Status 07/13/2016 FINAL  Final    Lab Basic Metabolic Panel:  Recent Labs Lab 06/27/2016 0155 07/11/16 0619 07/12/16 0529 07/15/16 0545  NA 139 141 141 138  K 4.2 4.8 4.7 4.6  CL 113* 116* 113* 111  CO2 20* 17* 24 14*  GLUCOSE 150* 107* 76 120*  BUN 48* 43* 46* 56*  CREATININE 0.91 0.86 0.86 1.22  CALCIUM 8.5* 8.5* 8.5* 7.8*   Liver  Function Tests:  Recent Labs Lab 07/11/16 0619  AST 72*  ALT 67*  ALKPHOS 105  BILITOT 0.8  PROT 4.7*  ALBUMIN 2.2*   No results for input(s): LIPASE, AMYLASE in the last 168 hours. No results for input(s): AMMONIA in the last 168 hours. CBC:  Recent Labs Lab 07/17/2016 0856 07/11/16 0724 07/14/16 0023  WBC 19.6* 16.5* 6.4  NEUTROABS  --  13.6* 4.5  HGB 9.4* 9.5* 10.6*  HCT 28.8* 29.5* 32.8*  MCV 87.8 90.2 89.9  PLT 303 367 386   Cardiac Enzymes: No results for input(s): CKTOTAL, CKMB, CKMBINDEX, TROPONINI in the last 168 hours. Sepsis Labs:  Recent Labs Lab 07/09/2016 0856 07/11/16 0724 07/14/16 0023  WBC 19.6* 16.5* 6.4    Procedures/Operations  N/A   Bary Leriche July 20, 2016, 4:02 PM

## 2016-07-18 DEATH — deceased

## 2019-02-27 IMAGING — CT CT ABD-PELV W/ CM
2 of 5 series · 15 of 46 positions shown, 17 images · IV contrast (iopamidol)
Comparison: CT scan of January 26, 2016.

CLINICAL DATA: Hematuria.  History of prostate cancer.

EXAM:
CT ABDOMEN AND PELVIS WITH CONTRAST
TECHNIQUE: Multidetector CT imaging of the abdomen and pelvis was performed
using the standard protocol following bolus administration of
intravenous contrast.
CONTRAST:  80mL FMEUCA-2LL IOPAMIDOL (FMEUCA-2LL) INJECTION 61%

[Series 3: abd/ pelvis 5.0 i30f 2 · axial · 0.80mm/px · z∈[+1034,+1499]mm · 12 of 105 slices shown, 14 images]
[im 6/105  soft-tissue]
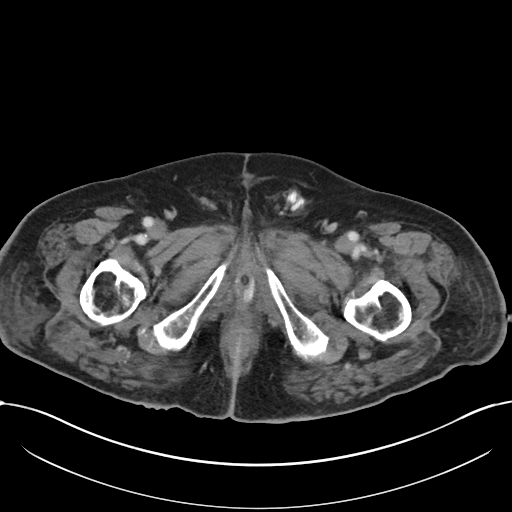
[im 6/105  bone]
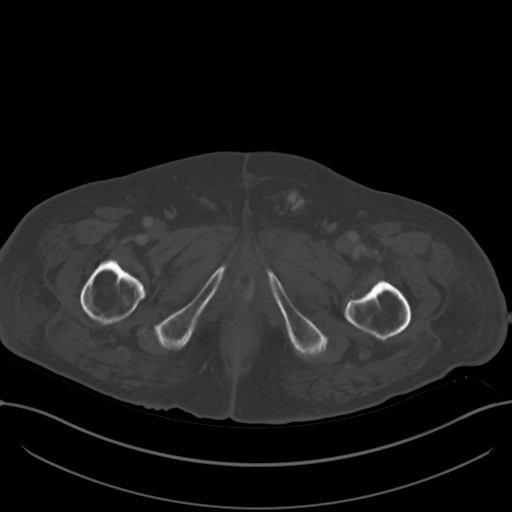
[im 16/105  soft-tissue]
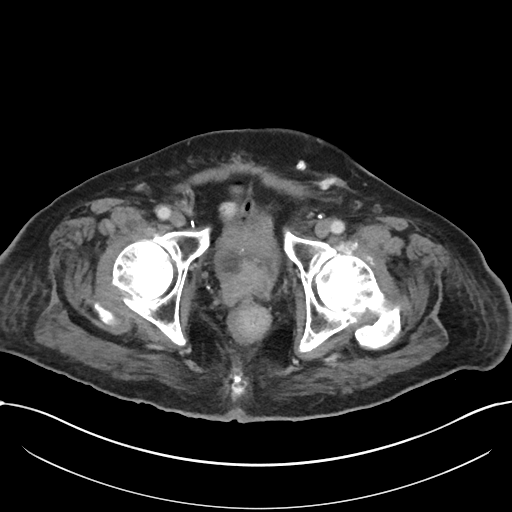
[im 21/105  soft-tissue]
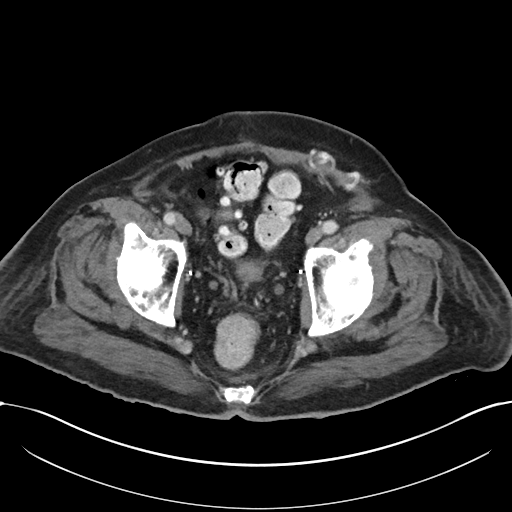
[im 32/105  soft-tissue]
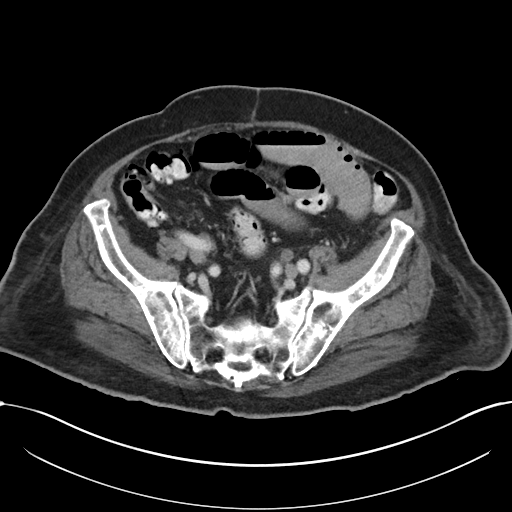
[im 42/105  soft-tissue]
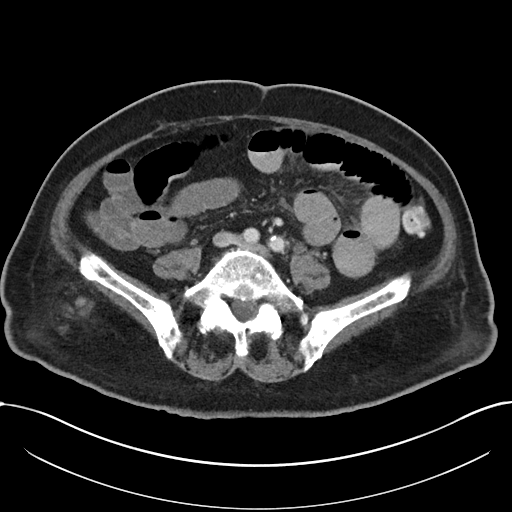
[im 47/105  soft-tissue]
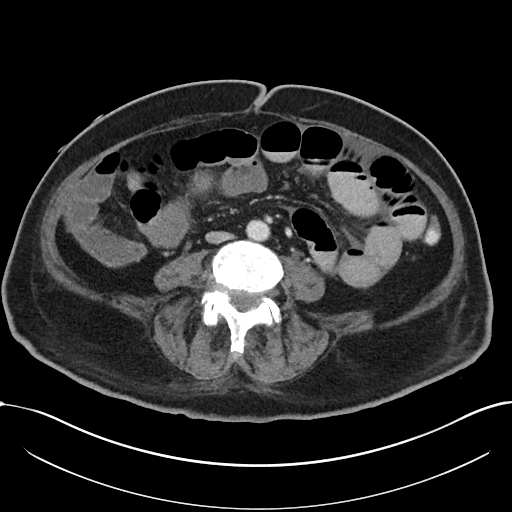
[im 58/105  soft-tissue]
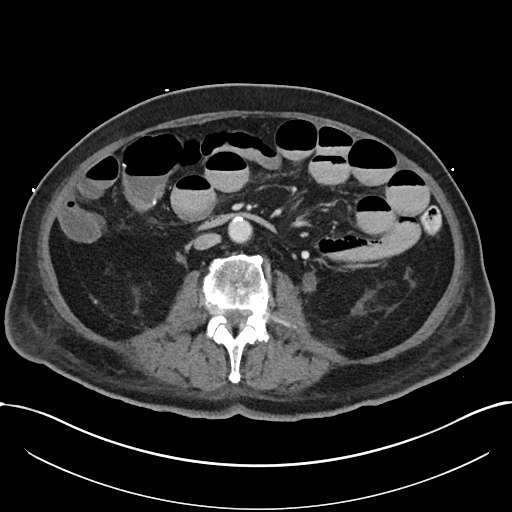
[im 63/105  soft-tissue]
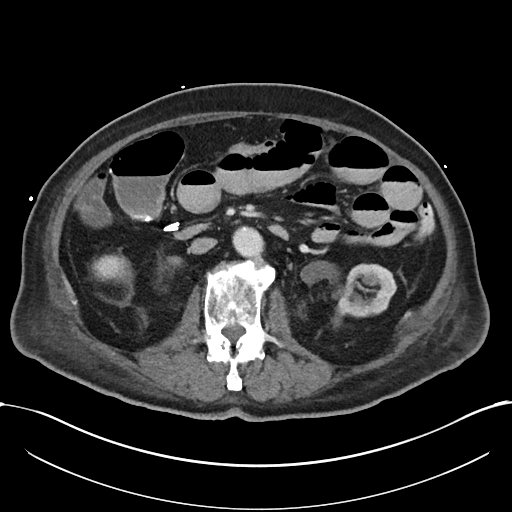
[im 73/105  soft-tissue]
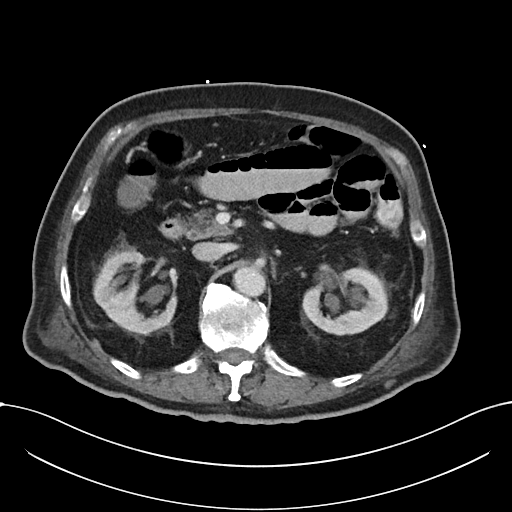
[im 73/105  bone]
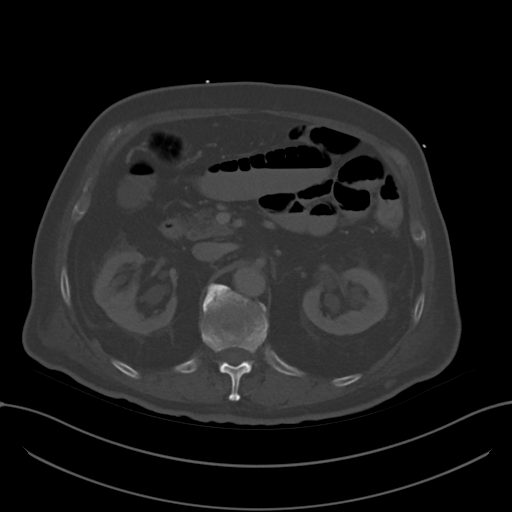
[im 84/105  soft-tissue]
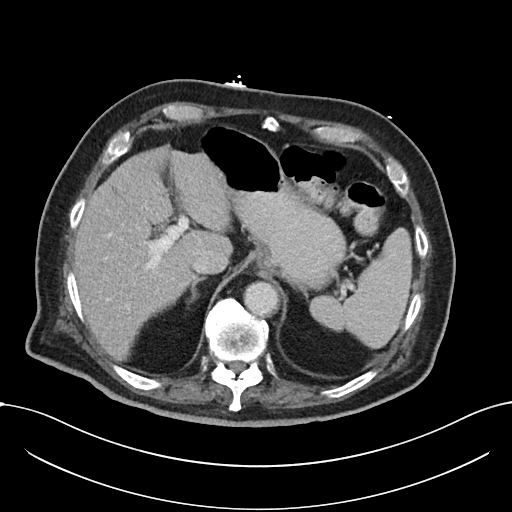
[im 89/105  soft-tissue]
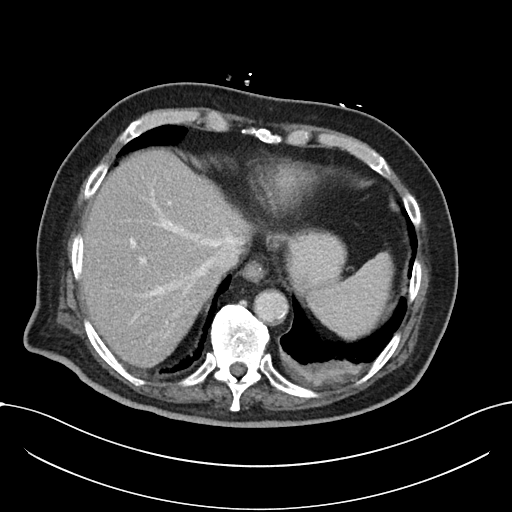
[im 99/105  soft-tissue]
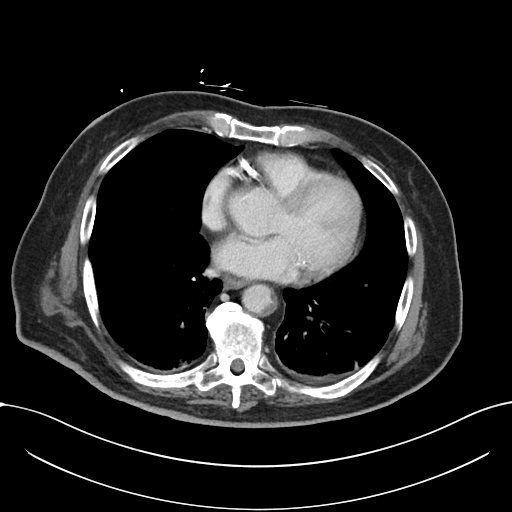

[Series 6: coronal soft tissue · coronal · 0.90mm/px · 3 of 91 slices shown]
[im 31/91  soft-tissue]
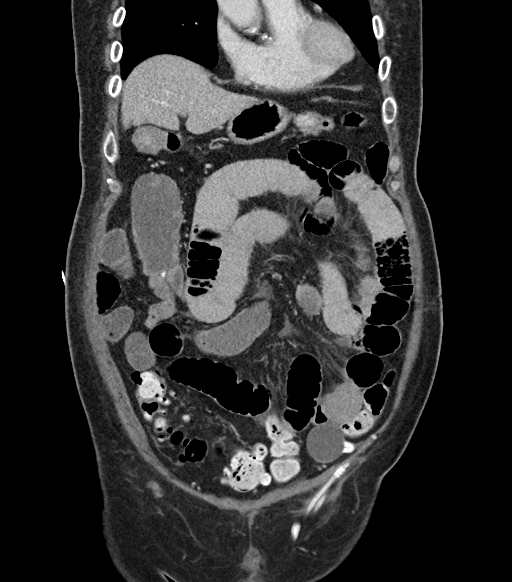
[im 41/91  soft-tissue]
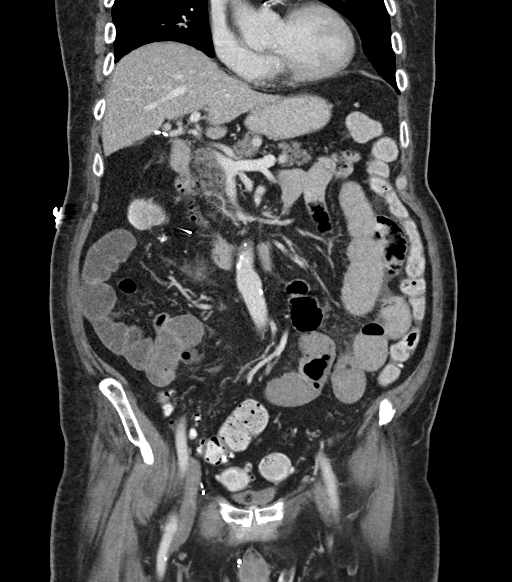
[im 51/91  soft-tissue]
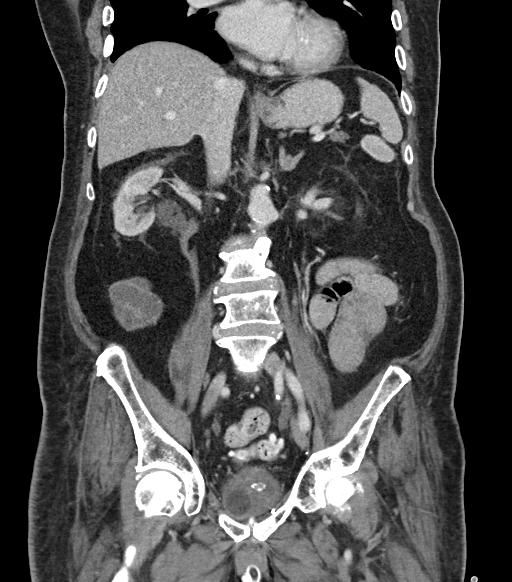

[15 of 46 positions shown; findings below may reference images not displayed]

FINDINGS: Lower chest: Mild bibasilar posterior subsegmental atelectasis is
noted.

Hepatobiliary: No focal liver abnormality is seen. Status post
cholecystectomy. No biliary dilatation.

Pancreas: Unremarkable. No pancreatic ductal dilatation or
surrounding inflammatory changes.

Spleen: Normal in size without focal abnormality.

Adrenals/Urinary Tract: Adrenal glands are unremarkable. Bilateral
parapelvic cysts are noted. Nonobstructive 1 cm calculus is seen in
middle pole infundibulum. Mild bilateral hydroureteronephrosis is
noted without obstructing calculus. Urinary bladder is decompressed
secondary to Foley catheter. Several bladder calculi are noted.
x 2.9 cm irregular lobulated mass appears to be involving the
posterior wall of the urinary bladder which is slightly enlarged
compared to prior exam. This is most consistent with invasive and
recurrent prostate cancer. Cystoscopy is recommended for further
evaluation.

Stomach/Bowel: Stomach is unremarkable. Status post right
hemicolectomy, with small bowel dilatation extending to the surgical
anastomosis. This is concerning for at least partial small bowel
obstruction.

Vascular/Lymphatic: Aortic atherosclerosis. No enlarged abdominal or
pelvic lymph nodes.

Reproductive: As noted above, 4.5 x 2.9 cm lobulated mass is seen in
posterior inferior portion of urinary bladder concerning for
invasive and recurrent prostate cancer.

Other: Penile reservoir is noted in left lower quadrant. No hernia
or abnormal fluid collection is noted.

Musculoskeletal: 2 sclerotic lesions are noted in the left iliac
wing which are increased compared to prior exam and consistent with
metastatic disease. Stable sclerotic density seen in right iliac
wing concerning for metastatic disease. New metastatic focus is
noted in inferior portion of left ischial tuberosity.
IMPRESSION: Mild bibasilar subsegmental atelectasis.

Nonobstructive 1 cm right renal calculus. Mild bilateral
hydroureteronephrosis is noted without obstructing calculus.

Urinary bladder is decompressed secondary to Foley catheter. Several
bladder calculi are noted. 4.5 x 2.9 cm irregular lobulated mass
appears to be involving the posterior and inferior wall of urinary
bladder which is slightly enlarged compared to prior exam. This is
concerning for invasive recurrent prostate cancer, and cystoscopy is
recommended for further evaluation.

Status post right hemicolectomy. Moderate small bowel dilatation is
noted which extends to the surgical anastomosis in the right side of
the abdomen, concerning for partial small bowel obstruction.

Aortic atherosclerosis.

Osseous metastatic lesions are noted in the left side of the pelvis
which are increased compared to prior exam.
